# Patient Record
Sex: Female | Born: 1955 | Race: White | Hispanic: No | Smoking: Current every day smoker
Health system: Southern US, Community
[De-identification: ages and names within clinical notes are randomized; demographics above are authoritative.]

## PROBLEM LIST (undated history)

## (undated) DIAGNOSIS — F419 Anxiety disorder, unspecified: Secondary | ICD-10-CM

## (undated) DIAGNOSIS — M76899 Other specified enthesopathies of unspecified lower limb, excluding foot: Secondary | ICD-10-CM

## (undated) DIAGNOSIS — M542 Cervicalgia: Secondary | ICD-10-CM

## (undated) DIAGNOSIS — R42 Dizziness and giddiness: Secondary | ICD-10-CM

## (undated) DIAGNOSIS — F319 Bipolar disorder, unspecified: Secondary | ICD-10-CM

## (undated) DIAGNOSIS — E669 Obesity, unspecified: Secondary | ICD-10-CM

## (undated) DIAGNOSIS — E119 Type 2 diabetes mellitus without complications: Secondary | ICD-10-CM

## (undated) DIAGNOSIS — G894 Chronic pain syndrome: Secondary | ICD-10-CM

## (undated) DIAGNOSIS — M545 Low back pain, unspecified: Secondary | ICD-10-CM

## (undated) DIAGNOSIS — R51 Headache: Secondary | ICD-10-CM

## (undated) DIAGNOSIS — R209 Unspecified disturbances of skin sensation: Secondary | ICD-10-CM

## (undated) DIAGNOSIS — I10 Essential (primary) hypertension: Secondary | ICD-10-CM

## (undated) DIAGNOSIS — M25549 Pain in joints of unspecified hand: Secondary | ICD-10-CM

## (undated) DIAGNOSIS — R519 Headache, unspecified: Secondary | ICD-10-CM

## (undated) DIAGNOSIS — I209 Angina pectoris, unspecified: Secondary | ICD-10-CM

## (undated) HISTORY — DX: Pain in joints of unspecified hand: M25.549

## (undated) HISTORY — DX: Chronic pain syndrome: G89.4

## (undated) HISTORY — DX: Headache, unspecified: R51.9

## (undated) HISTORY — DX: Low back pain, unspecified: M54.50

## (undated) HISTORY — PX: KNEE ARTHROPLASTY: SHX992

## (undated) HISTORY — DX: Type 2 diabetes mellitus without complications: E11.9

## (undated) HISTORY — DX: Headache: R51

## (undated) HISTORY — DX: Dizziness and giddiness: R42

## (undated) HISTORY — DX: Cervicalgia: M54.2

## (undated) HISTORY — DX: Low back pain: M54.5

## (undated) HISTORY — DX: Unspecified disturbances of skin sensation: R20.9

## (undated) HISTORY — PX: TONSILLECTOMY: SUR1361

## (undated) HISTORY — DX: Other specified enthesopathies of unspecified lower limb, excluding foot: M76.899

---

## 2000-12-15 ENCOUNTER — Other Ambulatory Visit: Admission: RE | Admit: 2000-12-15 | Discharge: 2000-12-15 | Payer: Self-pay | Admitting: Family Medicine

## 2001-12-04 ENCOUNTER — Encounter: Payer: Self-pay | Admitting: Emergency Medicine

## 2001-12-04 ENCOUNTER — Emergency Department (HOSPITAL_COMMUNITY): Admission: EM | Admit: 2001-12-04 | Discharge: 2001-12-04 | Payer: Self-pay | Admitting: Emergency Medicine

## 2002-03-27 ENCOUNTER — Other Ambulatory Visit: Admission: RE | Admit: 2002-03-27 | Discharge: 2002-03-27 | Payer: Self-pay | Admitting: Internal Medicine

## 2002-04-24 ENCOUNTER — Other Ambulatory Visit: Admission: RE | Admit: 2002-04-24 | Discharge: 2002-04-24 | Payer: Self-pay | Admitting: Internal Medicine

## 2002-10-13 ENCOUNTER — Encounter: Payer: Self-pay | Admitting: Emergency Medicine

## 2002-10-13 ENCOUNTER — Emergency Department (HOSPITAL_COMMUNITY): Admission: EM | Admit: 2002-10-13 | Discharge: 2002-10-13 | Payer: Self-pay | Admitting: Emergency Medicine

## 2003-04-02 ENCOUNTER — Other Ambulatory Visit: Admission: RE | Admit: 2003-04-02 | Discharge: 2003-04-02 | Payer: Self-pay | Admitting: Internal Medicine

## 2003-05-23 ENCOUNTER — Ambulatory Visit (HOSPITAL_COMMUNITY): Admission: RE | Admit: 2003-05-23 | Discharge: 2003-05-23 | Payer: Self-pay | Admitting: Obstetrics and Gynecology

## 2003-05-23 ENCOUNTER — Ambulatory Visit (HOSPITAL_BASED_OUTPATIENT_CLINIC_OR_DEPARTMENT_OTHER): Admission: RE | Admit: 2003-05-23 | Discharge: 2003-05-23 | Payer: Self-pay | Admitting: Obstetrics and Gynecology

## 2003-05-23 ENCOUNTER — Encounter (INDEPENDENT_AMBULATORY_CARE_PROVIDER_SITE_OTHER): Payer: Self-pay | Admitting: Specialist

## 2004-01-03 ENCOUNTER — Emergency Department (HOSPITAL_COMMUNITY): Admission: EM | Admit: 2004-01-03 | Discharge: 2004-01-03 | Payer: Self-pay | Admitting: Family Medicine

## 2004-07-10 ENCOUNTER — Ambulatory Visit: Payer: Self-pay | Admitting: Family Medicine

## 2004-08-13 ENCOUNTER — Ambulatory Visit: Payer: Self-pay | Admitting: Internal Medicine

## 2004-08-27 ENCOUNTER — Ambulatory Visit: Payer: Self-pay | Admitting: Internal Medicine

## 2004-09-03 ENCOUNTER — Ambulatory Visit: Payer: Self-pay | Admitting: *Deleted

## 2005-02-12 ENCOUNTER — Emergency Department (HOSPITAL_COMMUNITY): Admission: EM | Admit: 2005-02-12 | Discharge: 2005-02-12 | Payer: Self-pay | Admitting: Family Medicine

## 2005-05-22 ENCOUNTER — Emergency Department (HOSPITAL_COMMUNITY): Admission: EM | Admit: 2005-05-22 | Discharge: 2005-05-22 | Payer: Self-pay | Admitting: Family Medicine

## 2005-10-04 ENCOUNTER — Encounter: Admission: RE | Admit: 2005-10-04 | Discharge: 2005-10-04 | Payer: Self-pay | Admitting: Internal Medicine

## 2006-04-21 ENCOUNTER — Emergency Department (HOSPITAL_COMMUNITY): Admission: EM | Admit: 2006-04-21 | Discharge: 2006-04-21 | Payer: Self-pay | Admitting: Family Medicine

## 2007-02-06 ENCOUNTER — Emergency Department (HOSPITAL_COMMUNITY): Admission: EM | Admit: 2007-02-06 | Discharge: 2007-02-06 | Payer: Self-pay | Admitting: Family Medicine

## 2007-07-03 ENCOUNTER — Emergency Department (HOSPITAL_COMMUNITY): Admission: EM | Admit: 2007-07-03 | Discharge: 2007-07-03 | Payer: Self-pay | Admitting: Family Medicine

## 2008-02-23 ENCOUNTER — Encounter: Admission: RE | Admit: 2008-02-23 | Discharge: 2008-02-23 | Payer: Self-pay | Admitting: Internal Medicine

## 2008-03-30 ENCOUNTER — Emergency Department (HOSPITAL_COMMUNITY): Admission: EM | Admit: 2008-03-30 | Discharge: 2008-03-30 | Payer: Self-pay | Admitting: Emergency Medicine

## 2008-04-12 ENCOUNTER — Encounter: Admission: RE | Admit: 2008-04-12 | Discharge: 2008-04-12 | Payer: Self-pay | Admitting: Internal Medicine

## 2009-09-13 ENCOUNTER — Emergency Department (HOSPITAL_COMMUNITY): Admission: EM | Admit: 2009-09-13 | Discharge: 2009-09-13 | Payer: Self-pay | Admitting: Family Medicine

## 2010-06-14 ENCOUNTER — Encounter: Payer: Self-pay | Admitting: Internal Medicine

## 2010-07-22 ENCOUNTER — Inpatient Hospital Stay (INDEPENDENT_AMBULATORY_CARE_PROVIDER_SITE_OTHER)
Admission: RE | Admit: 2010-07-22 | Discharge: 2010-07-22 | Disposition: A | Discharge status: Home / Self Care | Payer: Medicaid Other | Source: Ambulatory Visit | Attending: Emergency Medicine | Admitting: Emergency Medicine

## 2010-07-22 DIAGNOSIS — R05 Cough: Secondary | ICD-10-CM

## 2010-07-22 DIAGNOSIS — R070 Pain in throat: Secondary | ICD-10-CM

## 2010-08-23 ENCOUNTER — Inpatient Hospital Stay (INDEPENDENT_AMBULATORY_CARE_PROVIDER_SITE_OTHER)
Admission: RE | Admit: 2010-08-23 | Discharge: 2010-08-23 | Disposition: A | Discharge status: Home / Self Care | Payer: Self-pay | Source: Ambulatory Visit | Attending: Family Medicine | Admitting: Family Medicine

## 2010-08-23 ENCOUNTER — Ambulatory Visit (INDEPENDENT_AMBULATORY_CARE_PROVIDER_SITE_OTHER): Payer: Self-pay

## 2010-08-23 ENCOUNTER — Ambulatory Visit (HOSPITAL_COMMUNITY): Payer: Self-pay

## 2010-08-23 ENCOUNTER — Ambulatory Visit (HOSPITAL_BASED_OUTPATIENT_CLINIC_OR_DEPARTMENT_OTHER): Admission: RE | Admit: 2010-08-23 | Payer: Self-pay | Source: Ambulatory Visit

## 2010-08-23 DIAGNOSIS — S93609A Unspecified sprain of unspecified foot, initial encounter: Secondary | ICD-10-CM

## 2010-08-23 DIAGNOSIS — S82109A Unspecified fracture of upper end of unspecified tibia, initial encounter for closed fracture: Secondary | ICD-10-CM

## 2010-08-24 ENCOUNTER — Other Ambulatory Visit (HOSPITAL_COMMUNITY): Payer: Self-pay | Admitting: Orthopedic Surgery

## 2010-08-24 DIAGNOSIS — S82109A Unspecified fracture of upper end of unspecified tibia, initial encounter for closed fracture: Secondary | ICD-10-CM

## 2010-08-24 DIAGNOSIS — M25562 Pain in left knee: Secondary | ICD-10-CM

## 2010-08-25 ENCOUNTER — Ambulatory Visit (HOSPITAL_COMMUNITY)
Admission: RE | Admit: 2010-08-25 | Discharge: 2010-08-25 | Disposition: A | Discharge status: Home / Self Care | Payer: Medicaid Other | Source: Ambulatory Visit | Attending: Orthopedic Surgery | Admitting: Orthopedic Surgery

## 2010-08-25 DIAGNOSIS — M25562 Pain in left knee: Secondary | ICD-10-CM

## 2010-08-25 DIAGNOSIS — S82109A Unspecified fracture of upper end of unspecified tibia, initial encounter for closed fracture: Secondary | ICD-10-CM | POA: Insufficient documentation

## 2010-08-25 DIAGNOSIS — W19XXXA Unspecified fall, initial encounter: Secondary | ICD-10-CM | POA: Insufficient documentation

## 2010-08-25 DIAGNOSIS — M1710 Unilateral primary osteoarthritis, unspecified knee: Secondary | ICD-10-CM | POA: Insufficient documentation

## 2010-08-27 ENCOUNTER — Other Ambulatory Visit (HOSPITAL_COMMUNITY): Payer: Self-pay | Admitting: Family Medicine

## 2010-08-27 ENCOUNTER — Ambulatory Visit (INDEPENDENT_AMBULATORY_CARE_PROVIDER_SITE_OTHER)
Admission: RE | Admit: 2010-08-27 | Discharge: 2010-08-27 | Disposition: A | Discharge status: Home / Self Care | Payer: Medicaid Other | Source: Ambulatory Visit | Attending: Family Medicine | Admitting: Family Medicine

## 2010-08-27 DIAGNOSIS — M25469 Effusion, unspecified knee: Secondary | ICD-10-CM

## 2010-08-27 DIAGNOSIS — W19XXXA Unspecified fall, initial encounter: Secondary | ICD-10-CM

## 2010-08-27 DIAGNOSIS — S82109A Unspecified fracture of upper end of unspecified tibia, initial encounter for closed fracture: Secondary | ICD-10-CM

## 2010-09-02 ENCOUNTER — Other Ambulatory Visit (HOSPITAL_COMMUNITY): Payer: Self-pay | Admitting: Orthopedic Surgery

## 2010-09-02 ENCOUNTER — Encounter (HOSPITAL_COMMUNITY)
Admission: RE | Admit: 2010-09-02 | Discharge: 2010-09-02 | Disposition: A | Discharge status: Home / Self Care | Payer: Medicaid Other | Source: Ambulatory Visit | Attending: Orthopedic Surgery | Admitting: Orthopedic Surgery

## 2010-09-02 DIAGNOSIS — S82143A Displaced bicondylar fracture of unspecified tibia, initial encounter for closed fracture: Secondary | ICD-10-CM

## 2010-09-02 LAB — COMPREHENSIVE METABOLIC PANEL
ALT: 17 U/L (ref 0–35)
Albumin: 3.7 g/dL (ref 3.5–5.2)
BUN: 6 mg/dL (ref 6–23)
CO2: 31 mEq/L (ref 19–32)
Chloride: 102 mEq/L (ref 96–112)
Creatinine, Ser: 0.81 mg/dL (ref 0.4–1.2)
GFR calc Af Amer: >60 mL/min (ref >60)
Potassium: 4 mEq/L (ref 3.5–5.1)
Sodium: 138 mEq/L (ref 135–145)
Total Bilirubin: 0.7 mg/dL (ref 0.3–1.2)
Total Protein: 6.1 g/dL (ref 6.0–8.3)

## 2010-09-02 LAB — CBC
HCT: 40.8 % (ref 36.0–46.0)
Hemoglobin: 13.6 g/dL (ref 12.0–15.0)
MCH: 29.6 pg (ref 26.0–34.0)
MCHC: 33.3 g/dL (ref 30.0–36.0)
MCV: 88.7 fL (ref 78.0–100.0)
Platelets: 203 10*3/uL (ref 150–400)
WBC: 7.5 10*3/uL (ref 4.0–10.5)

## 2010-09-02 LAB — SURGICAL PCR SCREEN: Staphylococcus aureus: POSITIVE — AB

## 2010-09-02 LAB — PROTIME-INR: Prothrombin Time: 13 seconds (ref 11.6–15.2)

## 2010-09-04 ENCOUNTER — Observation Stay (HOSPITAL_COMMUNITY)
Admission: RE | Admit: 2010-09-04 | Discharge: 2010-09-05 | Disposition: A | Discharge status: Home / Self Care | Payer: Medicaid Other | Source: Ambulatory Visit | Attending: Orthopedic Surgery | Admitting: Orthopedic Surgery

## 2010-09-04 ENCOUNTER — Observation Stay (HOSPITAL_COMMUNITY): Payer: Medicaid Other

## 2010-09-04 DIAGNOSIS — F329 Major depressive disorder, single episode, unspecified: Secondary | ICD-10-CM | POA: Insufficient documentation

## 2010-09-04 DIAGNOSIS — I10 Essential (primary) hypertension: Secondary | ICD-10-CM | POA: Insufficient documentation

## 2010-09-04 DIAGNOSIS — S82109A Unspecified fracture of upper end of unspecified tibia, initial encounter for closed fracture: Principal | ICD-10-CM | POA: Insufficient documentation

## 2010-09-04 DIAGNOSIS — F17200 Nicotine dependence, unspecified, uncomplicated: Secondary | ICD-10-CM | POA: Insufficient documentation

## 2010-09-04 DIAGNOSIS — W19XXXA Unspecified fall, initial encounter: Secondary | ICD-10-CM | POA: Insufficient documentation

## 2010-09-15 NOTE — Op Note (Signed)
Madison Wells, [place] NO.:  [id number]  MEDICAL RECORD NO.:  [id number]           PATIENT TYPE:  O  LOCATION:  5013                         FACILITY:  [facility]  PHYSICIAN:  Nadara Mustard, MD     DATE OF BIRTH:  Aug 31, 1955  DATE OF PROCEDURE:  09/04/2010 DATE OF DISCHARGE:                              OPERATIVE REPORT   PREOPERATIVE DIAGNOSIS:  Joint depression left lateral tibial plateau fracture.  POSTOPERATIVE DIAGNOSIS:  Joint depression left lateral tibial plateau fracture.  PROCEDURE:  Open reduction and internal fixation left lateral tibial plateau fracture.  SURGEON.:  Nadara Mustard, MD  ANESTHESIA:  General.  ESTIMATED BLOOD LOSS:  Minimal.  ANTIBIOTICS:  Kefzol 2 g.  DRAINS:  None.  COMPLICATIONS:  None.  DISPOSITION:  To PACU in stable condition.  Plan for discharge to home once safe with therapy.  Follow up in the office in 1 week.  INDICATIONS FOR PROCEDURE:  The patient is a 55 year old woman who fell from a ground-level fall sustaining a joint depression left lateral tibial plateau fracture.  A CT scan confirmed the displacement.  The patient presents at this time for open reduction and internal fixation. Risks and benefits were discussed including infection, neurovascular injury, persistent pain, need for additional surgery.  The patient states she understands and wished to proceed at this time.  DESCRIPTION OF PROCEDURE:  The patient was brought to OR room 15 and underwent a general anesthetic.  After adequate level of anesthesia obtained, the patient was placed on the flat [place] table and the left lower extremity was prepped using DuraPrep, draped into sterile field and Ioban was used to cover all exposed skin.  A guidewire was used to identify the starting point for the joint depression tibial plateau fracture.  Once this guide pin was centered at the joint depression fragment, this was then overdrilled with the cannulated  drill and then a bone tamp was used to tamp up the joint depression fragment.  C-arm fluoroscopy verified the reduction.  Two 7.3-cannulated screws were then used to hold the joint depression piece elevated.  C-arm fluoroscopy verified alignment and two 7.3 screws were used to stabilize the tibial plateau fracture.  C-arm fluoroscopy verified reduction.  The subcu was closed using 2-0 Vicryl, the skin was closed using 2-0 nylon with a far- near, near-far suture.  The wounds were irrigated and cleansed.  The wounds were covered with Adaptic orthopedic sponges, Kerlix and the patient underwent a compressive wrap from her toes to the tibial to just above the patella secondary to the significant venous stasis edema in the left lower extremity.  C-arm fluoroscopy was used to further evaluate the left foot showing to be no fractures.  She did have a significant swelling in her foot.  Previous radiographs were also negative for fractures.  The patient was extubated and taken to the PACU in stable condition.  Plan for discharge to home once she is safe with gait training.     Nadara Mustard, MD     MVD/MEDQ  D:  09/04/2010  T:  09/05/2010  Job:  [id number]  Electronically Signed by  Ellanor Feuerstein MD on 09/15/2010 03:01:01 PM

## 2010-10-09 NOTE — Op Note (Signed)
NAME:  Madison Wells, Madison Wells                     ACCOUNT NO.:  [id number]   MEDICAL RECORD NO.:  [id number]                   PATIENT TYPE:  AMB   LOCATION:  [facility]                                 FACILITY:  [facility]   PHYSICIAN:  Katherine Roan, M.D.               DATE OF BIRTH:  06-04-1955   DATE OF PROCEDURE:  05/23/2003  DATE OF DISCHARGE:                                 OPERATIVE REPORT   PREOPERATIVE DIAGNOSIS:  Heavy periods, status post cesarean section.   POSTOPERATIVE DIAGNOSIS:  Heavy periods, status post cesarean section.   OPERATION:  1. Pelvic examination under anesthesia.  2. Hysteroscopy with endometrial resection and fulguration.   The patient was placed in a lithotomy position after anesthesia was  administered and using LMA.  Exam under anesthesia revealed a well-supported  cervix.  The uterus was normal size and shape without masses.  The cervix  was carefully dilated and the endometrial cavity was visualized with the  resectoscope.  There was some granular tissue anteriorly and posteriorly,  and I resected the endometrial cavity very carefully and fulgurated the base  of the resection.  There were fairly vascular changes in the endometrium.   All the resected material was sent to the lab for study.  At the termination  of the procedure a thorough curettage was performed.  Scant blood loss  occurred.  Ms. Schmall tolerated this procedure well.                                               Katherine Roan, M.D.    SDM/MEDQ  D:  05/23/2003  T:  05/23/2003  Job:  [id number]

## 2011-02-12 LAB — POCT URINALYSIS DIP (DEVICE)
Glucose, UA: NEGATIVE
Nitrite: NEGATIVE
Operator id: 116391
Protein, ur: NEGATIVE
Specific Gravity, Urine: 1.01
pH: 5.5

## 2011-02-17 ENCOUNTER — Encounter
Payer: Medicaid Other | Attending: Physical Medicine and Rehabilitation | Admitting: Physical Medicine and Rehabilitation

## 2011-02-17 DIAGNOSIS — M545 Low back pain, unspecified: Secondary | ICD-10-CM | POA: Insufficient documentation

## 2011-02-17 DIAGNOSIS — R209 Unspecified disturbances of skin sensation: Secondary | ICD-10-CM | POA: Insufficient documentation

## 2011-02-17 DIAGNOSIS — M76899 Other specified enthesopathies of unspecified lower limb, excluding foot: Secondary | ICD-10-CM | POA: Insufficient documentation

## 2011-02-17 DIAGNOSIS — M25519 Pain in unspecified shoulder: Secondary | ICD-10-CM | POA: Insufficient documentation

## 2011-02-17 DIAGNOSIS — M542 Cervicalgia: Secondary | ICD-10-CM

## 2011-02-17 DIAGNOSIS — M79609 Pain in unspecified limb: Secondary | ICD-10-CM

## 2011-02-17 DIAGNOSIS — M25549 Pain in joints of unspecified hand: Secondary | ICD-10-CM
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
## 2011-02-18 ENCOUNTER — Other Ambulatory Visit: Payer: Self-pay | Admitting: Physical Medicine and Rehabilitation

## 2011-02-18 ENCOUNTER — Ambulatory Visit (HOSPITAL_COMMUNITY)
Admission: RE | Admit: 2011-02-18 | Discharge: 2011-02-18 | Disposition: A | Discharge status: Home / Self Care | Payer: Medicaid Other | Source: Ambulatory Visit | Attending: Physical Medicine and Rehabilitation | Admitting: Physical Medicine and Rehabilitation

## 2011-02-18 DIAGNOSIS — R52 Pain, unspecified: Secondary | ICD-10-CM

## 2011-02-18 DIAGNOSIS — M545 Low back pain, unspecified: Secondary | ICD-10-CM | POA: Insufficient documentation

## 2011-02-18 DIAGNOSIS — G8929 Other chronic pain: Secondary | ICD-10-CM | POA: Insufficient documentation

## 2011-02-18 DIAGNOSIS — M25519 Pain in unspecified shoulder: Secondary | ICD-10-CM | POA: Insufficient documentation

## 2011-02-23 ENCOUNTER — Ambulatory Visit (INDEPENDENT_AMBULATORY_CARE_PROVIDER_SITE_OTHER): Payer: Medicaid Other | Admitting: Physician Assistant

## 2011-02-23 DIAGNOSIS — F315 Bipolar disorder, current episode depressed, severe, with psychotic features: Secondary | ICD-10-CM

## 2011-03-09 ENCOUNTER — Ambulatory Visit: Payer: Medicaid Other

## 2011-03-10 ENCOUNTER — Encounter
Payer: Medicaid Other | Attending: Physical Medicine and Rehabilitation | Admitting: Physical Medicine and Rehabilitation

## 2011-03-10 DIAGNOSIS — M79609 Pain in unspecified limb: Secondary | ICD-10-CM

## 2011-03-10 DIAGNOSIS — R209 Unspecified disturbances of skin sensation: Secondary | ICD-10-CM

## 2011-03-10 DIAGNOSIS — G8929 Other chronic pain: Secondary | ICD-10-CM | POA: Insufficient documentation

## 2011-03-10 DIAGNOSIS — Z79899 Other long term (current) drug therapy: Secondary | ICD-10-CM | POA: Insufficient documentation

## 2011-03-10 DIAGNOSIS — M76899 Other specified enthesopathies of unspecified lower limb, excluding foot: Secondary | ICD-10-CM | POA: Insufficient documentation

## 2011-03-10 DIAGNOSIS — G5600 Carpal tunnel syndrome, unspecified upper limb: Secondary | ICD-10-CM | POA: Insufficient documentation

## 2011-03-11 ENCOUNTER — Ambulatory Visit: Payer: Medicaid Other | Attending: Physical Medicine and Rehabilitation

## 2011-03-11 DIAGNOSIS — M2560 Stiffness of unspecified joint, not elsewhere classified: Secondary | ICD-10-CM | POA: Insufficient documentation

## 2011-03-11 DIAGNOSIS — R5381 Other malaise: Secondary | ICD-10-CM | POA: Insufficient documentation

## 2011-03-11 DIAGNOSIS — M2550 Pain in unspecified joint: Secondary | ICD-10-CM | POA: Insufficient documentation

## 2011-03-11 DIAGNOSIS — IMO0001 Reserved for inherently not codable concepts without codable children: Secondary | ICD-10-CM | POA: Insufficient documentation

## 2011-03-12 ENCOUNTER — Ambulatory Visit (HOSPITAL_COMMUNITY): Payer: Medicaid Other | Admitting: Psychology

## 2011-03-17 ENCOUNTER — Encounter
Payer: Medicaid Other | Attending: Physical Medicine and Rehabilitation | Admitting: Physical Medicine and Rehabilitation

## 2011-03-17 DIAGNOSIS — M542 Cervicalgia: Secondary | ICD-10-CM

## 2011-03-17 DIAGNOSIS — M545 Low back pain, unspecified: Secondary | ICD-10-CM | POA: Insufficient documentation

## 2011-03-17 DIAGNOSIS — M79609 Pain in unspecified limb: Secondary | ICD-10-CM | POA: Insufficient documentation

## 2011-03-17 DIAGNOSIS — M25549 Pain in joints of unspecified hand: Secondary | ICD-10-CM

## 2011-03-17 DIAGNOSIS — M1710 Unilateral primary osteoarthritis, unspecified knee: Secondary | ICD-10-CM

## 2011-03-17 DIAGNOSIS — G894 Chronic pain syndrome: Secondary | ICD-10-CM

## 2011-03-17 DIAGNOSIS — M76899 Other specified enthesopathies of unspecified lower limb, excluding foot: Secondary | ICD-10-CM | POA: Insufficient documentation

## 2011-03-17 DIAGNOSIS — G5600 Carpal tunnel syndrome, unspecified upper limb: Secondary | ICD-10-CM | POA: Insufficient documentation

## 2011-03-17 DIAGNOSIS — G8929 Other chronic pain: Secondary | ICD-10-CM | POA: Insufficient documentation

## 2011-03-17 DIAGNOSIS — IMO0001 Reserved for inherently not codable concepts without codable children: Secondary | ICD-10-CM | POA: Insufficient documentation

## 2011-03-17 NOTE — Assessment & Plan Note (Signed)
Ms. Sinkler is a pleasant 55 year old woman who is followed at [facility] for multiple chronic pain complaints.  She is here for the results of her electrodiagnostic study which was done on July 10, 2010.  She was noted to have bilateral mild-to-moderate median neuropathy at the wrist consistent with carpal tunnel.  I have discussed this with there.  She also has complaints of low back pain which is her #1 problem and occasional left leg pain and bilateral leg swelling.  Pain is typically averaging between 6 and 8 on a scale of 10, described as sharp, burning, dull, stabbing, tingling, aching.  Pain is worse with walking, bending, sitting, standing; improves with rest, heat, medication.  Reports fair relief with current meds.  MEDICATIONS:  She is currently taking per primary care and Orthopedics include: 1. Nabumetone 750 one p.o. b.i.d. 2. Hydrocodone 5/500 q.6 hours. 3. Trazodone 100 mg b.i.d.  FUNCTIONAL STATUS:  She can walk 15 minutes last.  She is able to climb stairs and drive.  She is independent with self-care.  She also does some cooking and cleaning.  Denies problems controlling bowel or bladder.  Admits to depression, anxiety. Denies suicidal ideation.  No other changes in past medical, social, or family history.  PHYSICAL EXAMINATION:  VITAL SIGNS:  On exam today, her blood pressure is 118/37, pulse 78, respirations 18, 92% saturated on room air. GENERAL:  She is an obese woman who does not appear in any distress. She is oriented x3. NEUROLOGIC:  Speech is clear.  Affect is bright.  She is alert, cooperative, and pleasant.  Follows commands without difficulty. Answers my questions appropriately.  Cranial nerves, coordination are grossly intact.  Reflexes are 1+ in the upper and lower extremities.  No abnormal tone, clonus, or tremors are noted. MUSCULOSKELETAL:  Motor strength is good without obvious focal deficit in upper  and lower extremities. Transitioning from sitting to standing done without difficulty.  Gait is nonantalgic.  She has limitations in lumbar motion in all planes.  IMPRESSION: 1. Mild-to-moderate bilateral carpal tunnel syndrome. 2. Left lower extremity pain consistent with mild lumbar nerve root     irritation.  Pain radiates from the low back to the buttock and to     the left lower extremity especially with prolonged standing. 3. She has mild trochanteric bursitis.  PLAN:  We will give her prescription for some wrist splints at night.  I have discussed the use of these.  We will also trial her on gabapentin to see if we can improve her standing and walking for longer periods of time.  May also consider trial of tramadol as well.  I have answered all her questions.  We will see her back in a month.  I have reviewed the risks and benefits as well as side effects of the gabapentin with her, she would like to trial it.     Brantley Stage, M.D. Electronically Signed   DMK/MedQ D:  03/17/2011 13:39:22  T:  03/17/2011 21:47:29  Job #:  [id number]

## 2011-03-18 ENCOUNTER — Ambulatory Visit (HOSPITAL_COMMUNITY): Payer: Medicaid Other | Admitting: Psychology

## 2011-03-18 DIAGNOSIS — F39 Unspecified mood [affective] disorder: Secondary | ICD-10-CM

## 2011-03-23 ENCOUNTER — Ambulatory Visit: Payer: Medicaid Other | Admitting: Rehabilitation

## 2011-04-12 ENCOUNTER — Other Ambulatory Visit (HOSPITAL_COMMUNITY): Payer: Self-pay

## 2011-04-13 ENCOUNTER — Ambulatory Visit (HOSPITAL_COMMUNITY): Payer: Medicaid Other | Admitting: Physician Assistant

## 2011-04-13 ENCOUNTER — Encounter (HOSPITAL_COMMUNITY): Payer: Self-pay | Admitting: Physician Assistant

## 2011-04-13 VITALS — BP 141/73 | HR 65 | Temp 97.0°F | Ht 65.0 in | Wt 315.0 lb

## 2011-04-13 DIAGNOSIS — F323 Major depressive disorder, single episode, severe with psychotic features: Secondary | ICD-10-CM

## 2011-04-13 DIAGNOSIS — R52 Pain, unspecified: Secondary | ICD-10-CM

## 2011-04-13 DIAGNOSIS — F333 Major depressive disorder, recurrent, severe with psychotic symptoms: Secondary | ICD-10-CM

## 2011-04-13 DIAGNOSIS — E669 Obesity, unspecified: Secondary | ICD-10-CM

## 2011-04-13 DIAGNOSIS — I10 Essential (primary) hypertension: Secondary | ICD-10-CM

## 2011-04-13 MED ORDER — LAMOTRIGINE 100 MG PO TABS: 100.0000 mg | ORAL_TABLET | Freq: Every day | ORAL | Status: DC

## 2011-04-13 MED ORDER — CITALOPRAM HYDROBROMIDE 40 MG PO TABS: 40.0000 mg | ORAL_TABLET | Freq: Every day | ORAL | Status: DC

## 2011-04-13 MED ORDER — BUSPIRONE HCL 10 MG PO TABS: 10.0000 mg | ORAL_TABLET | Freq: Two times a day (BID) | ORAL | Status: DC

## 2011-04-13 MED ORDER — BUPROPION HCL ER (SR) 150 MG PO TB12: 150.0000 mg | ORAL_TABLET | Freq: Two times a day (BID) | ORAL | Status: DC

## 2011-04-13 MED ORDER — ARIPIPRAZOLE 5 MG PO TABS: 5.0000 mg | ORAL_TABLET | Freq: Every day | ORAL | Status: DC

## 2011-04-13 NOTE — Progress Notes (Signed)
[facility] [id number] Progress Note  Madison Wells [id number] 55 y.o.  04/13/2011 9:29 AM  Chief Complaint: Med management  History of Present Illness: Pt. 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 Suicidal Ideation: No Plan Formed: No Patient has means to carry out plan: No  Homicidal Ideation: No Plan Formed: No Patient has means to carry out plan: No  Review of Systems:  [facility]. Psychiatric: Agitation: Yes moderate and mostly in the afternoon Hallucination: No Depressed Mood: Yes mild Insomnia: No Hypersomnia: No Altered Concentration: No Feels Worthless: Yes sometimes due to her current situation Grandiose Ideas: No Belief In Special Powers: No New/Increased Substance Abuse: No Compulsions: No  Neurologic: Headache: No Seizure: No Paresthesias: Yes pt states numbness in her left arm, left leg, and can't feel her finger tips.  Past Medical Family, Social History:   Outpatient Encounter Prescriptions as of 04/13/2011  Medication Sig Dispense Refill  . ARIPiprazole (ABILIFY) 5 MG tablet Take 5 mg by mouth daily. Take one in the am and 2 at bedtime      . buPROPion (WELLBUTRIN SR) 150 MG 12 hr tablet Take 150 mg by mouth 2 (two) times daily.        . busPIRone (BUSPAR) 10 MG tablet Take 10 mg by mouth 2 (two) times daily. Take one by po bid       . citalopram (CELEXA) 40 MG tablet Take 40 mg by mouth daily. Take one po qd      . lamoTRIgine (LAMICTAL) 100 MG tablet Take 100 mg by mouth daily.        . traZODone (DESYREL) 100 MG tablet Take 100 mg by mouth at bedtime.          Past Psychiatric History/Hospitalization(s): Anxiety: Yes Bipolar Disorder: Yes Depression: Yes Mania: No Psychosis: No Schizophrenia: No Personality Disorder: No Hospitalization for psychiatric illness: No History of Electroconvulsive Shock Therapy: No Prior  Suicide Attempts: Yes  Physical Exam: Constitutional:  BP 141/73  Pulse 65  Temp(Src) 97 F (36.1 C) (Temporal)  Ht 5\' 5"  (1.651 m)  Wt 315 lb (142.883 kg)  BMI 52.42 kg/m2  General Appearance: alert, oriented, no acute distress and obese  Musculoskeletal: Strength & Muscle Tone: Pt. is morbidly obese and does not move well due to knee pain. Gait & Station: unsteady Patient leans: N/A  Psychiatric: Speech (describe rate, volume, coherence, spontaneity, and abnormalities if any): Normal  Thought Process (describe rate, content, abstract reasoning, and computation): normal  Associations: Coherent  Thoughts: normal  Mental Status: Orientation: oriented to person, place, time/date, situation and day of week Mood & Affect: normal affect Attention Span & Concentration: fair  Medical Decision Making (Choose Three): Established Problem, Stable/Improving (1), Order AIMS Test (2) and Review of Last Therapy Session (1)  Assessment: Axis I: 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  Axis V: GAF: 60   Plan: Continue current plan of care, medications as ordered, follow up in 8 weeks.           Labs as ordered, and continue outpatient therapy.  Kelani Robart, PA 04/13/2011
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
## 2011-04-14 ENCOUNTER — Encounter: Payer: Medicaid Other | Attending: Neurosurgery | Admitting: Neurosurgery

## 2011-04-14 ENCOUNTER — Ambulatory Visit: Payer: Medicaid Other | Admitting: Physical Medicine and Rehabilitation

## 2011-04-14 DIAGNOSIS — G5600 Carpal tunnel syndrome, unspecified upper limb: Secondary | ICD-10-CM | POA: Insufficient documentation

## 2011-04-14 DIAGNOSIS — M65849 Other synovitis and tenosynovitis, unspecified hand: Secondary | ICD-10-CM

## 2011-04-14 DIAGNOSIS — M545 Low back pain, unspecified: Secondary | ICD-10-CM | POA: Insufficient documentation

## 2011-04-14 DIAGNOSIS — M5430 Sciatica, unspecified side: Secondary | ICD-10-CM

## 2011-04-14 DIAGNOSIS — M65839 Other synovitis and tenosynovitis, unspecified forearm: Secondary | ICD-10-CM

## 2011-04-14 DIAGNOSIS — M76899 Other specified enthesopathies of unspecified lower limb, excluding foot: Secondary | ICD-10-CM

## 2011-04-14 DIAGNOSIS — M25549 Pain in joints of unspecified hand: Secondary | ICD-10-CM

## 2011-04-14 DIAGNOSIS — M79609 Pain in unspecified limb: Secondary | ICD-10-CM | POA: Insufficient documentation

## 2011-04-14 LAB — CBC WITH DIFFERENTIAL/PLATELET
Basophils Absolute: 0 10*3/uL (ref 0.0–0.1)
Basophils Relative: 0 % (ref 0–1)
Eosinophils Absolute: 0.1 10*3/uL (ref 0.0–0.7)
Hemoglobin: 13.2 g/dL (ref 12.0–15.0)
MCH: 28.8 pg (ref 26.0–34.0)
MCHC: 31.6 g/dL (ref 30.0–36.0)
Neutro Abs: 4.2 10*3/uL (ref 1.7–7.7)
Neutrophils Relative %: 59 % (ref 43–77)
Platelets: 196 10*3/uL (ref 150–400)

## 2011-04-14 LAB — HEMOGLOBIN A1C
Hgb A1c MFr Bld: 5.8 % — ABNORMAL HIGH (ref <5.7)
Mean Plasma Glucose: 120 mg/dL — ABNORMAL HIGH (ref <117)

## 2011-04-14 LAB — VITAMIN D 25 HYDROXY (VIT D DEFICIENCY, FRACTURES): Vit D, 25-Hydroxy: 52 ng/mL (ref 30–89)

## 2011-04-14 LAB — TSH: TSH: 1.931 u[IU]/mL (ref 0.350–4.500)

## 2011-04-14 NOTE — Assessment & Plan Note (Signed)
This is a patient of Dr. Leretha Dykes who is seen as a new patient in October.  At that time, she was started on a trial of gabapentin for her chronic multiple pain complaints.  She has got carpal tunnel syndrome as evidenced by EMG as well as back pain and lower extremity pain.  We did discuss both her cervical and lumbar x-rays which were basically within normal limits.  She states she is still having the problem in her back and she feels like she knows something is wrong regardless of what the x- rays says.  We are going to order an MRI of her lumbar spine.  She rates her average pain as 7.  It is a sharp, burning, dull stabbing, aching pain.  General activity level is 6.  Pain is worse in the evening and night.  Sleep patterns are poor.  Walking, bending, sitting, standing, and activity aggravate.  Rest, heat, ice, and medication tend to help. She uses a cane for ambulation.  She climb steps and drive.  She can walk about 15 minutes long.  She is on disability.  REVIEW OF SYSTEMS:  Notable for difficulties described above as well as some paresthesias, depression, and anxiety.  No suicidal thoughts or aberrant behaviors.  PAST MEDICAL HISTORY/SOCIAL HISTORY/FAMILY HISTORY:  Unchanged.  PHYSICAL EXAM:  Blood pressure is 137/74, pulse 70, respirations 18, and O2 sats 97 on room air.  Her motor strength and sensation are intact. Constitutionally, she is obese.  She is alert and oriented x3.  She has a slight limp to her gait.  IMPRESSION: 1. Mild to moderate bilateral carpal tunnel syndrome. 2. Left lower extremity pain consistent with nerve root irritation     with low back pain.  PLAN:  Refill gabapentin 300 mg 1 p.o. t.i.d., #90 with 2 refills.  We are going to add tramadol 50 mg 1 p.o. b.i.d., #60 with 1 refill.  Her questions were encouraged and answered.  Dr. Pamelia Hoit will see her back in a month after her MRI.     Hyden Soley L. Blima Dessert Electronically  Signed    RLW/MedQ D:  04/14/2011 09:10:54  T:  04/14/2011 09:45:07  Job #:  [id number]

## 2011-04-15 LAB — COMPLETE METABOLIC PANEL WITH GFR
AST: 22 U/L (ref 0–37)
Albumin: 4 g/dL (ref 3.5–5.2)
Alkaline Phosphatase: 89 U/L (ref 39–117)
BUN: 11 mg/dL (ref 6–23)
Calcium: 9.3 mg/dL (ref 8.4–10.5)
Chloride: 105 mEq/L (ref 96–112)
Creat: 0.67 mg/dL (ref 0.50–1.10)
GFR, Est Non African American: >89 mL/min
Glucose, Bld: 82 mg/dL (ref 70–99)
Potassium: 4.1 mEq/L (ref 3.5–5.3)

## 2011-04-16 ENCOUNTER — Emergency Department (HOSPITAL_COMMUNITY): Payer: Medicaid Other

## 2011-04-16 ENCOUNTER — Other Ambulatory Visit: Payer: Self-pay

## 2011-04-16 ENCOUNTER — Emergency Department (HOSPITAL_COMMUNITY)
Admission: EM | Admit: 2011-04-16 | Discharge: 2011-04-17 | Disposition: A | Discharge status: Home / Self Care | Payer: Medicaid Other | Attending: Emergency Medicine | Admitting: Emergency Medicine

## 2011-04-16 ENCOUNTER — Encounter (HOSPITAL_COMMUNITY): Payer: Self-pay | Admitting: Emergency Medicine

## 2011-04-16 DIAGNOSIS — E669 Obesity, unspecified: Secondary | ICD-10-CM | POA: Insufficient documentation

## 2011-04-16 DIAGNOSIS — L02419 Cutaneous abscess of limb, unspecified: Secondary | ICD-10-CM | POA: Insufficient documentation

## 2011-04-16 DIAGNOSIS — F319 Bipolar disorder, unspecified: Secondary | ICD-10-CM | POA: Insufficient documentation

## 2011-04-16 DIAGNOSIS — M7989 Other specified soft tissue disorders: Secondary | ICD-10-CM | POA: Insufficient documentation

## 2011-04-16 DIAGNOSIS — L03116 Cellulitis of left lower limb: Secondary | ICD-10-CM

## 2011-04-16 DIAGNOSIS — R609 Edema, unspecified: Secondary | ICD-10-CM | POA: Insufficient documentation

## 2011-04-16 DIAGNOSIS — M79609 Pain in unspecified limb: Secondary | ICD-10-CM | POA: Insufficient documentation

## 2011-04-16 DIAGNOSIS — Z79899 Other long term (current) drug therapy: Secondary | ICD-10-CM | POA: Insufficient documentation

## 2011-04-16 DIAGNOSIS — I10 Essential (primary) hypertension: Secondary | ICD-10-CM | POA: Insufficient documentation

## 2011-04-16 DIAGNOSIS — L03119 Cellulitis of unspecified part of limb: Secondary | ICD-10-CM | POA: Insufficient documentation

## 2011-04-16 HISTORY — DX: Essential (primary) hypertension: I10

## 2011-04-16 HISTORY — DX: Bipolar disorder, unspecified: F31.9

## 2011-04-16 HISTORY — DX: Anxiety disorder, unspecified: F41.9

## 2011-04-16 HISTORY — DX: Obesity, unspecified: E66.9

## 2011-04-16 LAB — CBC
Hemoglobin: 12.2 g/dL (ref 12.0–15.0)
MCH: 29.6 pg (ref 26.0–34.0)
MCHC: 33.4 g/dL (ref 30.0–36.0)

## 2011-04-16 LAB — BASIC METABOLIC PANEL
BUN: 10 mg/dL (ref 6–23)
Creatinine, Ser: 0.65 mg/dL (ref 0.50–1.10)
GFR calc Af Amer: >90 mL/min (ref >90)
GFR calc non Af Amer: >90 mL/min (ref >90)

## 2011-04-16 LAB — DIFFERENTIAL
Basophils Relative: 0 % (ref 0–1)
Eosinophils Absolute: 0.1 10*3/uL (ref 0.0–0.7)
Monocytes Absolute: 0.5 10*3/uL (ref 0.1–1.0)
Monocytes Relative: 7 % (ref 3–12)
Neutrophils Relative %: 66 % (ref 43–77)

## 2011-04-16 LAB — D-DIMER, QUANTITATIVE: D-Dimer, Quant: 0.37 ug/mL-FEU (ref 0.00–0.48)

## 2011-04-16 MED ORDER — CLINDAMYCIN PHOSPHATE 600 MG/4ML IJ SOLN
INTRAMUSCULAR | Status: AC
  Filled 2011-04-16: qty 4

## 2011-04-16 MED ORDER — MORPHINE SULFATE 4 MG/ML IJ SOLN
4.0000 mg | Freq: Once | INTRAMUSCULAR | Status: AC
  Administered 2011-04-16: 4 mg via INTRAVENOUS
  Filled 2011-04-16: qty 1

## 2011-04-16 MED ORDER — HYDROCODONE-ACETAMINOPHEN 5-325 MG PO TABS: 1.0000 | ORAL_TABLET | ORAL | Status: DC | PRN

## 2011-04-16 MED ORDER — CLINDAMYCIN HCL 150 MG PO CAPS: 300.0000 mg | ORAL_CAPSULE | Freq: Three times a day (TID) | ORAL | Status: DC

## 2011-04-16 MED ORDER — CLINDAMYCIN PHOSPHATE 600 MG/50ML IV SOLN
600.0000 mg | Freq: Once | INTRAVENOUS | Status: AC
  Administered 2011-04-16: 600 mg via INTRAVENOUS

## 2011-04-16 MED ORDER — CLINDAMYCIN HCL 150 MG PO CAPS
600.0000 mg | ORAL_CAPSULE | Freq: Once | ORAL | Status: DC
  Filled 2011-04-16: qty 4

## 2011-04-16 MED ORDER — DEXTROSE 5 % IV SOLN
INTRAVENOUS | Status: AC
  Filled 2011-04-16: qty 50

## 2011-04-16 NOTE — ED Notes (Signed)
PT. REPORTS PROGRESSING LEFT LOWER LEG SWELLING WITH PAIN AND REDDNESS , DENIES INJURY OR FALL.

## 2011-04-16 NOTE — ED Provider Notes (Signed)
History     CSN: [id number] Arrival date & time: 04/16/2011  7:41 PM   First MD Initiated Contact with Patient 04/16/11 2122      Chief Complaint  Patient presents with  . Leg Swelling    (Consider location/radiation/quality/duration/timing/severity/associated sxs/prior treatment) HPI History provided by pt.   Pt developed edema, erythema and pain of L lower leg yesterday.  Has chronic, intermittent, peripheral edema, but usually improves w/ elevation.  RLE edema improves w/ elevation while LLE edema does not since yesterday.  Pain is not aggravated by bearing weight/walking. Denies trauma.  Denies fever.  No risk factors for DVT.  Pt also reports that she has had exertional SOB since yesterday.  No prior history.  Denies CP and cough.    Past Medical History  Diagnosis Date  . Obesity   . Hypertension   . Anxiety   . Bipolar 1 disorder     Past Surgical History  Procedure Date  . Cesarean section   . Knee arthroplasty   . Tonsillectomy     No family history on file.  History  Substance Use Topics  . Smoking status: Current Everyday Smoker -- 10.0 packs/day for 40 years    Types: Cigarettes  . Smokeless tobacco: Not on file  . Alcohol Use: No    OB History    Grav Para Term Preterm Abortions TAB SAB Ect Mult Living                  Review of Systems  All other systems reviewed and are negative.    Allergies  Review of patient's allergies indicates no known allergies.  Home Medications   Current Outpatient Rx  Name Route Sig Dispense Refill  . ARIPIPRAZOLE 5 MG PO TABS Oral Take 1 tablet (5 mg total) by mouth daily. 90 tablet 2  . BUPROPION HCL ER (SR) 150 MG PO TB12 Oral Take 1 tablet (150 mg total) by mouth 2 (two) times daily. 60 tablet 2  . BUSPIRONE HCL 10 MG PO TABS Oral Take 1 tablet (10 mg total) by mouth 2 (two) times daily. Take one by po bid 60 tablet 2  . CITALOPRAM HYDROBROMIDE 40 MG PO TABS Oral Take 40 mg by mouth daily.      Marland Kitchen GABAPENTIN  300 MG PO CAPS Oral Take 300 mg by mouth 3 (three) times daily.      Marland Kitchen LAMOTRIGINE 100 MG PO TABS Oral Take 1 tablet (100 mg total) by mouth daily. 30 tablet 2  . TRAMADOL HCL 50 MG PO TABS Oral Take 50 mg by mouth 2 (two) times daily. Maximum dose= 8 tablets per day     . TRAZODONE HCL 100 MG PO TABS Oral Take 100-200 mg by mouth at bedtime.       BP 112/65  Pulse 85  Temp(Src) 97.9 F (36.6 C) (Oral)  Resp 14  SpO2 95%  Physical Exam  Nursing note and vitals reviewed. Constitutional: She is oriented to person, place, and time. She appears well-developed and well-nourished. No distress.       obese  HENT:  Head: Normocephalic and atraumatic.  Eyes:       Normal appearance  Neck: Normal range of motion.  Cardiovascular: Normal rate and regular rhythm.   Pulmonary/Chest: Effort normal and breath sounds normal.  Musculoskeletal:       Distal half of left anterior lower leg erythematous and ttp.  No calf tenderness.  Bilateral, equal, 2+ pitting edema.  2+  DP pulses and sensation intact bilaterally.    Neurological: She is alert and oriented to person, place, and time.  Skin: Skin is warm and dry. No rash noted.  Psychiatric: She has a normal mood and affect. Her behavior is normal.    ED Course  Procedures (including critical care time)   Date: 04/17/2011  Rate: 67  Rhythm: normal sinus rhythm  QRS Axis: normal  Intervals: normal  ST/T Wave abnormalities: normal  Conduction Disutrbances:none  Narrative Interpretation: low voltage  Old EKG Reviewed: unchanged   Labs Reviewed  BASIC METABOLIC PANEL - Abnormal; Notable for the following:    Potassium 3.3 (*)    Glucose, Bld 124 (*)    All other components within normal limits  D-DIMER, QUANTITATIVE  TROPONIN I  CBC  DIFFERENTIAL  LAB REPORT - SCANNED   Dg Chest 2 View  04/16/2011  *RADIOLOGY REPORT*  Clinical Data: Shortness of breath; left lower leg swelling and rash.  CHEST - 2 VIEW  Comparison: Chest radiograph  performed 09/02/2010  Findings: The lungs are well-aerated.  Mild vascular congestion is noted, without significant pulmonary edema.  There is no evidence of focal opacification, pleural effusion or pneumothorax.  The heart is borderline normal in size; the mediastinal contour is within normal limits.  No acute osseous abnormalities are seen.  IMPRESSION: Mild vascular congestion noted; lungs remain grossly clear.  Original Report Authenticated By: Tonia Ghent, M.D.     1. Cellulitis of left lower extremity       MDM  Pt presents w/ pain, erythema and edema of L lower leg.  She is concerned she has a DVT because though she has chronic, intermittent peripheral edema, sx usually improve w/ elevation.  Exam most consistent w/ cellulitis.  No risk factors for DVT and pain is not aggravated by bearing weight/walking.  ROS reveals that she has had new exertional SOB since yesterday as well.  Discussed w/ Dr. Rubin Payor.  He recommends ordering D-dimer and SOB work-up.  IV clinda and morphine ordered.  EKG, CXR and labs pending.  10:07 PM   EKG non-ischemic and troponin neg.  D-dimer w/in nml range.  Results discussed w/ pt.  She understands that DVT very unlikely and we will continue to treat for infection.  D/c'd home w/ clinda and vicodin for pain.  Recommended f/u with PCP on Monday.  Return precautions discussed.         Otilio Miu, [place] 04/17/11 1146

## 2011-04-16 NOTE — ED Notes (Signed)
Pt returned from xray via stretcher.  She reports that the left lower leg began to turn red and swell yesterday.  She denies injury.  She currently rates pain at 8/10.

## 2011-04-17 NOTE — ED Provider Notes (Signed)
Medical screening examination/treatment/procedure(s) were performed by non-physician practitioner and as supervising physician I was immediately available for consultation/collaboration.   Trey Bebee R. Ryosuke Ericksen, MD 04/17/11 2211 

## 2011-04-20 ENCOUNTER — Encounter (HOSPITAL_COMMUNITY): Payer: Self-pay | Admitting: *Deleted

## 2011-04-20 ENCOUNTER — Encounter (HOSPITAL_COMMUNITY): Payer: Self-pay | Admitting: Physician Assistant

## 2011-04-20 ENCOUNTER — Inpatient Hospital Stay (HOSPITAL_COMMUNITY)
Admission: EM | Admit: 2011-04-20 | Discharge: 2011-04-24 | DRG: 603 | Disposition: A | Discharge status: Home / Self Care | Payer: Medicaid Other | Attending: Internal Medicine | Admitting: Internal Medicine

## 2011-04-20 DIAGNOSIS — L03116 Cellulitis of left lower limb: Secondary | ICD-10-CM

## 2011-04-20 DIAGNOSIS — I872 Venous insufficiency (chronic) (peripheral): Secondary | ICD-10-CM | POA: Diagnosis present

## 2011-04-20 DIAGNOSIS — I10 Essential (primary) hypertension: Secondary | ICD-10-CM | POA: Diagnosis present

## 2011-04-20 DIAGNOSIS — F319 Bipolar disorder, unspecified: Secondary | ICD-10-CM | POA: Diagnosis present

## 2011-04-20 DIAGNOSIS — L02419 Cutaneous abscess of limb, unspecified: Principal | ICD-10-CM | POA: Diagnosis present

## 2011-04-20 DIAGNOSIS — L97809 Non-pressure chronic ulcer of other part of unspecified lower leg with unspecified severity: Secondary | ICD-10-CM | POA: Diagnosis present

## 2011-04-20 DIAGNOSIS — E669 Obesity, unspecified: Secondary | ICD-10-CM | POA: Diagnosis present

## 2011-04-20 DIAGNOSIS — F17200 Nicotine dependence, unspecified, uncomplicated: Secondary | ICD-10-CM | POA: Diagnosis present

## 2011-04-20 DIAGNOSIS — F419 Anxiety disorder, unspecified: Secondary | ICD-10-CM

## 2011-04-20 DIAGNOSIS — Z23 Encounter for immunization: Secondary | ICD-10-CM

## 2011-04-20 DIAGNOSIS — F064 Anxiety disorder due to known physiological condition: Secondary | ICD-10-CM | POA: Diagnosis present

## 2011-04-20 DIAGNOSIS — Z96659 Presence of unspecified artificial knee joint: Secondary | ICD-10-CM

## 2011-04-20 DIAGNOSIS — Z79899 Other long term (current) drug therapy: Secondary | ICD-10-CM

## 2011-04-20 DIAGNOSIS — M7989 Other specified soft tissue disorders: Secondary | ICD-10-CM | POA: Diagnosis present

## 2011-04-20 HISTORY — DX: Angina pectoris, unspecified: I20.9

## 2011-04-20 NOTE — ED Notes (Signed)
The pt has redness and swelling in her lt lower leg and she says she  Is starting to get the same in her rt lower leg,.  She was seen here Friday for the same.  The pt says the redness is increasing and the swelling in the  Leg has increased

## 2011-04-21 ENCOUNTER — Encounter (HOSPITAL_COMMUNITY): Payer: Self-pay | Admitting: Emergency Medicine

## 2011-04-21 DIAGNOSIS — I10 Essential (primary) hypertension: Secondary | ICD-10-CM | POA: Diagnosis present

## 2011-04-21 DIAGNOSIS — E669 Obesity, unspecified: Secondary | ICD-10-CM | POA: Diagnosis present

## 2011-04-21 DIAGNOSIS — M7989 Other specified soft tissue disorders: Secondary | ICD-10-CM | POA: Diagnosis present

## 2011-04-21 DIAGNOSIS — F419 Anxiety disorder, unspecified: Secondary | ICD-10-CM | POA: Diagnosis present

## 2011-04-21 DIAGNOSIS — F319 Bipolar disorder, unspecified: Secondary | ICD-10-CM | POA: Diagnosis present

## 2011-04-21 LAB — DIFFERENTIAL
Basophils Absolute: 0 10*3/uL (ref 0.0–0.1)
Basophils Relative: 0 % (ref 0–1)
Neutro Abs: 4.2 10*3/uL (ref 1.7–7.7)
Neutrophils Relative %: 62 % (ref 43–77)

## 2011-04-21 LAB — CBC
HCT: 37.5 % (ref 36.0–46.0)
MCH: 29.5 pg (ref 26.0–34.0)
MCHC: 34.3 g/dL (ref 30.0–36.0)
MCV: 90.8 fL (ref 78.0–100.0)
Platelets: 180 10*3/uL (ref 150–400)
RBC: 4.13 MIL/uL (ref 3.87–5.11)
RDW: 14.7 % (ref 11.5–15.5)
WBC: 6.4 10*3/uL (ref 4.0–10.5)

## 2011-04-21 LAB — BASIC METABOLIC PANEL
CO2: 33 mEq/L — ABNORMAL HIGH (ref 19–32)
Calcium: 8.9 mg/dL (ref 8.4–10.5)
Chloride: 103 mEq/L (ref 96–112)
Chloride: 102 mEq/L (ref 96–112)
Creatinine, Ser: 0.73 mg/dL (ref 0.50–1.10)
GFR calc Af Amer: >90 mL/min (ref >90)
Glucose, Bld: 136 mg/dL — ABNORMAL HIGH (ref 70–99)
Potassium: 3.6 mEq/L (ref 3.5–5.1)
Sodium: 141 mEq/L (ref 135–145)

## 2011-04-21 MED ORDER — SODIUM CHLORIDE 0.9 % IV SOLN: 250.0000 mL | INTRAVENOUS | Status: DC | PRN

## 2011-04-21 MED ORDER — HEPARIN SODIUM (PORCINE) 5000 UNIT/ML IJ SOLN
5000.0000 [IU] | Freq: Three times a day (TID) | INTRAMUSCULAR | Status: DC
  Administered 2011-04-21 – 2011-04-24 (×10): 5000 [IU] via SUBCUTANEOUS
  Filled 2011-04-21 (×13): qty 1

## 2011-04-21 MED ORDER — FUROSEMIDE 10 MG/ML IJ SOLN
40.0000 mg | Freq: Once | INTRAMUSCULAR | Status: AC
  Administered 2011-04-21: 40 mg via INTRAVENOUS
  Filled 2011-04-21: qty 4

## 2011-04-21 MED ORDER — ARIPIPRAZOLE 5 MG PO TABS
5.0000 mg | ORAL_TABLET | Freq: Every day | ORAL | Status: DC
  Administered 2011-04-21 – 2011-04-24 (×4): 5 mg via ORAL
  Filled 2011-04-21 (×4): qty 1

## 2011-04-21 MED ORDER — FUROSEMIDE 10 MG/ML IJ SOLN
INTRAMUSCULAR | Status: AC
  Administered 2011-04-21: 20 mg
  Filled 2011-04-21: qty 4

## 2011-04-21 MED ORDER — ONDANSETRON HCL 4 MG PO TABS: 4.0000 mg | ORAL_TABLET | Freq: Four times a day (QID) | ORAL | Status: DC | PRN

## 2011-04-21 MED ORDER — LAMOTRIGINE 100 MG PO TABS
100.0000 mg | ORAL_TABLET | Freq: Every day | ORAL | Status: DC
  Administered 2011-04-21 – 2011-04-24 (×4): 100 mg via ORAL
  Filled 2011-04-21 (×4): qty 1

## 2011-04-21 MED ORDER — VANCOMYCIN HCL 1000 MG IV SOLR
1500.0000 mg | Freq: Once | INTRAVENOUS | Status: AC
  Administered 2011-04-21: 1500 mg via INTRAVENOUS
  Filled 2011-04-21 (×2): qty 1500

## 2011-04-21 MED ORDER — ONDANSETRON HCL 4 MG/2ML IJ SOLN: 4.0000 mg | Freq: Four times a day (QID) | INTRAMUSCULAR | Status: DC | PRN

## 2011-04-21 MED ORDER — FUROSEMIDE 10 MG/ML IJ SOLN
INTRAMUSCULAR | Status: AC
  Filled 2011-04-21: qty 4

## 2011-04-21 MED ORDER — GABAPENTIN 300 MG PO CAPS
300.0000 mg | ORAL_CAPSULE | Freq: Three times a day (TID) | ORAL | Status: DC
  Administered 2011-04-21 – 2011-04-24 (×9): 300 mg via ORAL
  Filled 2011-04-21 (×12): qty 1

## 2011-04-21 MED ORDER — TRAZODONE HCL 100 MG PO TABS
200.0000 mg | ORAL_TABLET | Freq: Every day | ORAL | Status: DC
  Administered 2011-04-21 – 2011-04-23 (×3): 200 mg via ORAL
  Filled 2011-04-21 (×4): qty 2

## 2011-04-21 MED ORDER — SODIUM CHLORIDE 0.9 % IJ SOLN: 3.0000 mL | INTRAMUSCULAR | Status: DC | PRN

## 2011-04-21 MED ORDER — VANCOMYCIN HCL IN DEXTROSE 1-5 GM/200ML-% IV SOLN
1000.0000 mg | Freq: Three times a day (TID) | INTRAVENOUS | Status: DC
  Administered 2011-04-21 – 2011-04-24 (×8): 1000 mg via INTRAVENOUS
  Filled 2011-04-21 (×11): qty 200

## 2011-04-21 MED ORDER — CLONAZEPAM 0.5 MG PO TABS
0.5000 mg | ORAL_TABLET | Freq: Three times a day (TID) | ORAL | Status: DC
  Administered 2011-04-21 – 2011-04-24 (×9): 0.5 mg via ORAL
  Filled 2011-04-21 (×10): qty 1

## 2011-04-21 MED ORDER — TRIAMCINOLONE ACETONIDE 0.5 % EX CREA
TOPICAL_CREAM | Freq: Two times a day (BID) | CUTANEOUS | Status: DC
  Administered 2011-04-21 – 2011-04-23 (×6): via TOPICAL
  Filled 2011-04-21 (×3): qty 15

## 2011-04-21 MED ORDER — INFLUENZA VIRUS VACC SPLIT PF IM SUSP
0.5000 mL | INTRAMUSCULAR | Status: AC
  Administered 2011-04-22: 0.5 mL via INTRAMUSCULAR
  Filled 2011-04-21: qty 0.5

## 2011-04-21 MED ORDER — DOCUSATE SODIUM 100 MG PO CAPS
100.0000 mg | ORAL_CAPSULE | Freq: Two times a day (BID) | ORAL | Status: DC
  Administered 2011-04-21 – 2011-04-22 (×4): 100 mg via ORAL
  Filled 2011-04-21 (×7): qty 1

## 2011-04-21 MED ORDER — OXYCODONE HCL 5 MG PO TABS
5.0000 mg | ORAL_TABLET | Freq: Four times a day (QID) | ORAL | Status: DC | PRN
  Administered 2011-04-21 – 2011-04-22 (×4): 5 mg via ORAL
  Filled 2011-04-21 (×5): qty 1

## 2011-04-21 MED ORDER — SODIUM CHLORIDE 0.9 % IJ SOLN
3.0000 mL | Freq: Two times a day (BID) | INTRAMUSCULAR | Status: DC
  Administered 2011-04-21 – 2011-04-23 (×6): 3 mL via INTRAVENOUS

## 2011-04-21 MED ORDER — VANCOMYCIN HCL IN DEXTROSE 1-5 GM/200ML-% IV SOLN
1000.0000 mg | Freq: Once | INTRAVENOUS | Status: AC
  Administered 2011-04-21: 1000 mg via INTRAVENOUS
  Filled 2011-04-21: qty 200

## 2011-04-21 MED ORDER — BUSPIRONE HCL 10 MG PO TABS
10.0000 mg | ORAL_TABLET | Freq: Two times a day (BID) | ORAL | Status: DC
  Administered 2011-04-21 – 2011-04-24 (×6): 10 mg via ORAL
  Filled 2011-04-21 (×9): qty 1

## 2011-04-21 MED ORDER — ACETAMINOPHEN 325 MG PO TABS
650.0000 mg | ORAL_TABLET | Freq: Four times a day (QID) | ORAL | Status: DC | PRN
  Filled 2011-04-21: qty 2

## 2011-04-21 MED ORDER — MORPHINE SULFATE 4 MG/ML IJ SOLN
4.0000 mg | Freq: Once | INTRAMUSCULAR | Status: AC
  Administered 2011-04-21: 4 mg via INTRAVENOUS
  Filled 2011-04-21: qty 1

## 2011-04-21 MED ORDER — HYDROMORPHONE HCL PF 1 MG/ML IJ SOLN
1.0000 mg | Freq: Once | INTRAMUSCULAR | Status: AC
  Administered 2011-04-21: 1 mg via INTRAVENOUS
  Filled 2011-04-21: qty 1

## 2011-04-21 MED ORDER — TRAMADOL HCL 50 MG PO TABS
50.0000 mg | ORAL_TABLET | Freq: Two times a day (BID) | ORAL | Status: DC
  Administered 2011-04-21 – 2011-04-24 (×7): 50 mg via ORAL
  Filled 2011-04-21 (×8): qty 1

## 2011-04-21 MED ORDER — FUROSEMIDE 10 MG/ML IJ SOLN
20.0000 mg | Freq: Four times a day (QID) | INTRAMUSCULAR | Status: AC
  Administered 2011-04-21 (×2): 20 mg via INTRAVENOUS

## 2011-04-21 MED ORDER — CITALOPRAM HYDROBROMIDE 40 MG PO TABS
40.0000 mg | ORAL_TABLET | Freq: Every day | ORAL | Status: DC
  Administered 2011-04-21 – 2011-04-24 (×4): 40 mg via ORAL
  Filled 2011-04-21 (×4): qty 1

## 2011-04-21 MED ORDER — ACETAMINOPHEN 650 MG RE SUPP: 650.0000 mg | Freq: Four times a day (QID) | RECTAL | Status: DC | PRN

## 2011-04-21 MED ORDER — ONDANSETRON HCL 4 MG/2ML IJ SOLN
4.0000 mg | Freq: Once | INTRAMUSCULAR | Status: AC
  Administered 2011-04-21: 4 mg via INTRAVENOUS
  Filled 2011-04-21: qty 2

## 2011-04-21 MED ORDER — BUPROPION HCL ER (SR) 150 MG PO TB12
150.0000 mg | ORAL_TABLET | Freq: Two times a day (BID) | ORAL | Status: DC
Start: 2011-04-21 — End: 2011-04-24
  Administered 2011-04-21 – 2011-04-24 (×6): 150 mg via ORAL
  Filled 2011-04-21 (×10): qty 1

## 2011-04-21 MED ORDER — SENNA 8.6 MG PO TABS: 2.0000 | ORAL_TABLET | Freq: Every day | ORAL | Status: DC | PRN

## 2011-04-21 NOTE — Progress Notes (Signed)
Utilization Review Completed.Shade Rivenbark T11/28/2012   

## 2011-04-21 NOTE — ED Provider Notes (Signed)
History     CSN: [id number] Arrival date & time: 04/20/2011  7:38 PM   First MD Initiated Contact with Patient 04/20/11 2333      Chief Complaint  Patient presents with  . Cellulitis    HPI  History provided by the patient in recent chart. Patient presents for a recheck up of left lower leg pain and swelling. Patient reports being seen in the emergency room last Friday, 4 days ago for similar complaints. Patient was diagnosed with left lower leg cellulitis and treated with clindamycin. Patient reports taking this as instructed. Today she states that left lower leg has become more painful and erythematous spreading up her leg towards the knee. Pain is worse with palpation or with walking. Patient denies any fever, chills, sweats, nausea or vomiting. Patient has history of morbid obesity and hypertension but has no other significant past medical history.   Past Medical History  Diagnosis Date  . Obesity   . Hypertension   . Anxiety   . Bipolar 1 disorder     Past Surgical History  Procedure Date  . Cesarean section   . Knee arthroplasty   . Tonsillectomy     History reviewed. No pertinent family history.  History  Substance Use Topics  . Smoking status: Current Everyday Smoker -- 10.0 packs/day for 40 years    Types: Cigarettes  . Smokeless tobacco: Not on file  . Alcohol Use: No    OB History    Grav Para Term Preterm Abortions TAB SAB Ect Mult Living                  Review of Systems  Constitutional: Negative for fever and chills.  Respiratory: Negative for cough and shortness of breath.   Cardiovascular: Negative for chest pain.  Gastrointestinal: Negative for nausea, vomiting and abdominal pain.  Neurological: Negative for light-headedness and headaches.  All other systems reviewed and are negative.    Allergies  Review of patient's allergies indicates no known allergies.  Home Medications   Current Outpatient Rx  Name Route Sig Dispense Refill  .  ARIPIPRAZOLE 5 MG PO TABS Oral Take 1 tablet (5 mg total) by mouth daily. 90 tablet 2  . BUPROPION HCL ER (SR) 150 MG PO TB12 Oral Take 1 tablet (150 mg total) by mouth 2 (two) times daily. 60 tablet 2  . BUSPIRONE HCL 10 MG PO TABS Oral Take 1 tablet (10 mg total) by mouth 2 (two) times daily. Take one by po bid 60 tablet 2  . CITALOPRAM HYDROBROMIDE 40 MG PO TABS Oral Take 40 mg by mouth daily.     Marland Kitchen CLINDAMYCIN HCL 150 MG PO CAPS Oral Take 2 capsules (300 mg total) by mouth 3 (three) times daily. 21 capsule 0  . CLONAZEPAM 0.5 MG PO TABS Oral Take 0.5 mg by mouth 3 (three) times daily.      Marland Kitchen GABAPENTIN 300 MG PO CAPS Oral Take 300 mg by mouth 3 (three) times daily.      Marland Kitchen HYDROCODONE-ACETAMINOPHEN 5-325 MG PO TABS Oral Take 1 tablet by mouth every 4 (four) hours as needed. For pain     . LAMOTRIGINE 100 MG PO TABS Oral Take 1 tablet (100 mg total) by mouth daily. 30 tablet 2  . TRAMADOL HCL 50 MG PO TABS Oral Take 50 mg by mouth 2 (two) times daily. Maximum dose= 8 tablets per day     . TRAZODONE HCL 100 MG PO TABS Oral Take 200  mg by mouth at bedtime.       BP 120/67  Pulse 71  Temp(Src) 97.9 F (36.6 C) (Oral)  Resp 20  SpO2 97%  Physical Exam  Nursing note and vitals reviewed. Constitutional: She is oriented to person, place, and time. She appears well-developed and well-nourished. No distress.       Morbidly obese  HENT:  Head: Normocephalic.  Cardiovascular: Normal rate, regular rhythm and normal heart sounds.   Pulmonary/Chest: Effort normal and breath sounds normal. She has no wheezes. She has no rales.  Abdominal: Soft.  Musculoskeletal: Normal range of motion. She exhibits edema and tenderness.       Diffuse swelling of left lower extremity with erythema and increased warmth. She also is mild swelling of right lower extremity with normal appearing skin. Patient has normal distal sensation and pulses in bilateral feet.  Neurological: She is oriented to person, place, and  time.  Skin: Skin is warm. There is erythema.  Psychiatric: She has a normal mood and affect. Her behavior is normal.    ED Course  Procedures (including critical care time)   Labs Reviewed  CBC  DIFFERENTIAL  BASIC METABOLIC PANEL   Results for orders placed during the hospital encounter of 04/20/11  CBC      Component Value Range   WBC 6.8  4.0 - 10.5 (K/uL)   RBC 4.18  3.87 - 5.11 (MIL/uL)   Hemoglobin 12.9  12.0 - 15.0 (g/dL)   HCT 16.1  09.6 - 04.5 (%)   MCV 90.0  78.0 - 100.0 (fL)   MCH 30.9  26.0 - 34.0 (pg)   MCHC 34.3  30.0 - 36.0 (g/dL)   RDW 40.9  81.1 - 91.4 (%)   Platelets 182  150 - 400 (K/uL)  DIFFERENTIAL      Component Value Range   Neutrophils Relative 62  43 - 77 (%)   Neutro Abs 4.2  1.7 - 7.7 (K/uL)   Lymphocytes Relative 30  12 - 46 (%)   Lymphs Abs 2.0  0.7 - 4.0 (K/uL)   Monocytes Relative 6  3 - 12 (%)   Monocytes Absolute 0.4  0.1 - 1.0 (K/uL)   Eosinophils Relative 2  0 - 5 (%)   Eosinophils Absolute 0.1  0.0 - 0.7 (K/uL)   Basophils Relative 0  0 - 1 (%)   Basophils Absolute 0.0  0.0 - 0.1 (K/uL)  BASIC METABOLIC PANEL      Component Value Range   Sodium 141  135 - 145 (mEq/L)   Potassium 3.6  3.5 - 5.1 (mEq/L)   Chloride 103  96 - 112 (mEq/L)   CO2 29  19 - 32 (mEq/L)   Glucose, Bld 110 (*) 70 - 99 (mg/dL)   BUN 14  6 - 23 (mg/dL)   Creatinine, Ser 7.82  0.50 - 1.10 (mg/dL)   Calcium 8.9  8.4 - 95.6 (mg/dL)   GFR calc non Af Amer >90  >90 (mL/min)   GFR calc Af Amer >90  >90 (mL/min)     1. Cellulitis of left lower extremity       MDM  12:15 AM patient seen and evaluated. Patient in no acute distress.  1:00 AM patient seen and discussed with attending physician. Patient with worsening cellulitis and failed outpatient treatment. Plan will be to call hospitalist for admission. Will start IV vancomycin now.   1:45 AM patient discussed with triad hospitalist. They will see and admit to a general  bed under team 6.   Angus Seller, PA 04/21/11 [phone]

## 2011-04-21 NOTE — Progress Notes (Signed)
ANTIBIOTIC CONSULT NOTE - INITIAL  Pharmacy Consult for vancomycin Indication: cellulitis  No Known Allergies  Patient Measurements: Height: 5\' 6"  (167.6 cm) Weight: 334 lb 14.1 oz (151.9 kg) IBW/kg (Calculated) : 59.3   Vital Signs: Temp: 97.8 F (36.6 C) (11/28 0430) Temp src: Oral (11/28 0255) BP: 134/74 mmHg (11/28 0430) Pulse Rate: 80  (11/28 0430)  Labs:  [facility] 04/21/11 0625 04/21/11 0023  WBC 6.4 6.8  HGB 12.2 12.9  PLT 180 182  LABCREA -- --  CREATININE -- 0.73   Estimated Creatinine Clearance: 120.8 ml/min (by C-G formula based on Cr of 0.73).  Microbiology: No results found for this or any previous visit (from the past 720 hour(s)).  Medical History: Past Medical History  Diagnosis Date  . Obesity   . Hypertension   . Anxiety   . Bipolar 1 disorder   . Angina    Medications:  Prescriptions prior to admission  Medication Sig Dispense Refill  . ARIPiprazole (ABILIFY) 5 MG tablet Take 1 tablet (5 mg total) by mouth daily.  90 tablet  2  . buPROPion (WELLBUTRIN SR) 150 MG 12 hr tablet Take 1 tablet (150 mg total) by mouth 2 (two) times daily.  60 tablet  2  . busPIRone (BUSPAR) 10 MG tablet Take 1 tablet (10 mg total) by mouth 2 (two) times daily. Take one by po bid  60 tablet  2  . citalopram (CELEXA) 40 MG tablet Take 40 mg by mouth daily.       . clindamycin (CLEOCIN) 150 MG capsule Take 2 capsules (300 mg total) by mouth 3 (three) times daily.  21 capsule  0  . clonazePAM (KLONOPIN) 0.5 MG tablet Take 0.5 mg by mouth 3 (three) times daily.        Marland Kitchen gabapentin (NEURONTIN) 300 MG capsule Take 300 mg by mouth 3 (three) times daily.        Marland Kitchen HYDROcodone-acetaminophen (NORCO) 5-325 MG per tablet Take 1 tablet by mouth every 4 (four) hours as needed. For pain       . lamoTRIgine (LAMICTAL) 100 MG tablet Take 1 tablet (100 mg total) by mouth daily.  30 tablet  2  . traMADol (ULTRAM) 50 MG tablet Take 50 mg by mouth 2 (two) times daily. Maximum dose= 8  tablets per day       . traZODone (DESYREL) 100 MG tablet Take 200 mg by mouth at bedtime.        Scheduled:    . ARIPiprazole  5 mg Oral Daily  . buPROPion  150 mg Oral BID  . busPIRone  10 mg Oral BID  . citalopram  40 mg Oral Daily  . clonazePAM  0.5 mg Oral TID  . docusate sodium  100 mg Oral BID  . furosemide  20 mg Intravenous Q6H  . furosemide  40 mg Intravenous Once  . gabapentin  300 mg Oral TID  . heparin  5,000 Units Subcutaneous Q8H  .  HYDROmorphone (DILAUDID) injection  1 mg Intravenous Once  . influenza  inactive virus vaccine  0.5 mL Intramuscular Tomorrow-1000  . lamoTRIgine  100 mg Oral Daily  .  morphine injection  4 mg Intravenous Once  . ondansetron (ZOFRAN) IV  4 mg Intravenous Once  . sodium chloride  3 mL Intravenous Q12H  . traMADol  50 mg Oral BID  . traZODone  200 mg Oral QHS  . triamcinolone cream   Topical BID  . vancomycin  1,000 mg Intravenous Once  Assessment: 55yo female c/o pain and erythema of LLE, to begin IV ABX for cellulitis.  Goal of Therapy:  Vancomycin trough level 10-15 mcg/ml  Plan:  Rec'd 1000mg  of vanc in ED; will give additional vanc 1500mg  to complete load  followed by 1000mg  IV Q8H and monitor CBC, Cx, levels prn.  Colleen Can PharmD BCPS 04/21/2011,7:55 AM

## 2011-04-21 NOTE — Progress Notes (Signed)
*  PRELIMINARY RESULTS* LLEV has been performed. No obvious deep vein thrombosis involving the left lower extremity of the visualized veins. Not able to visualize the distal femoral vein. No obvious evidence of Baker's Cyst on the left.   Madison Wells 04/21/2011, 11:45 AM

## 2011-04-21 NOTE — Progress Notes (Signed)
Subjective: No new complaints, comfortable.  Objective: Vital signs in last 24 hours: Filed Vitals:   04/21/11 0112 04/21/11 0255 04/21/11 0430 04/21/11 0617  BP: 114/70 121/77 134/74   Pulse: 70 70 80   Temp: 97.4 F (36.3 C) 97.5 F (36.4 C) 97.8 F (36.6 C)   TempSrc: Oral Oral    Resp: 18 20 18    Height:    5\' 6"  (1.676 m)  Weight:    151.9 kg (334 lb 14.1 oz)  SpO2: 94% 94% 96%    Weight change:   Intake/Output Summary (Last 24 hours) at 04/21/11 1149 Last data filed at 04/21/11 0558  Gross per 24 hour  Intake    120 ml  Output      0 ml  Net    120 ml   Physical Exam:   General Appearance:    Alert, cooperative, no distress, appears stated age  Lungs:     Clear to auscultation bilaterally, respirations unlabored   Heart:    Regular rate and rhythm, S1 and S2 normal, no murmur, rub   or gallop  Abdomen:     Soft, non-tender, bowel sounds active all four quadrants,    Extremities:   Left lower extremity swelling and redness, and tenderness present. Right lower extremity swelling present.   Pulses:   2+ and symmetric all extremities     Neurologic:   CNII-XII intact, normal strength, sensation and reflexes    throughout     Lab Results: Results for orders placed during the hospital encounter of 04/20/11 (from the past 24 hour(s))  CBC     Status: Normal   Collection Time   04/21/11 12:23 AM      Component Value Range   WBC 6.8  4.0 - 10.5 (K/uL)   RBC 4.18  3.87 - 5.11 (MIL/uL)   Hemoglobin 12.9  12.0 - 15.0 (g/dL)   HCT 09.8  11.9 - 14.7 (%)   MCV 90.0  78.0 - 100.0 (fL)   MCH 30.9  26.0 - 34.0 (pg)   MCHC 34.3  30.0 - 36.0 (g/dL)   RDW 82.9  56.2 - 13.0 (%)   Platelets 182  150 - 400 (K/uL)  DIFFERENTIAL     Status: Normal   Collection Time   04/21/11 12:23 AM      Component Value Range   Neutrophils Relative 62  43 - 77 (%)   Neutro Abs 4.2  1.7 - 7.7 (K/uL)   Lymphocytes Relative 30  12 - 46 (%)   Lymphs Abs 2.0  0.7 - 4.0 (K/uL)   Monocytes  Relative 6  3 - 12 (%)   Monocytes Absolute 0.4  0.1 - 1.0 (K/uL)   Eosinophils Relative 2  0 - 5 (%)   Eosinophils Absolute 0.1  0.0 - 0.7 (K/uL)   Basophils Relative 0  0 - 1 (%)   Basophils Absolute 0.0  0.0 - 0.1 (K/uL)  BASIC METABOLIC PANEL     Status: Abnormal   Collection Time   04/21/11 12:23 AM      Component Value Range   Sodium 141  135 - 145 (mEq/L)   Potassium 3.6  3.5 - 5.1 (mEq/L)   Chloride 103  96 - 112 (mEq/L)   CO2 29  19 - 32 (mEq/L)   Glucose, Bld 110 (*) 70 - 99 (mg/dL)   BUN 14  6 - 23 (mg/dL)   Creatinine, Ser 8.65  0.50 - 1.10 (mg/dL)   Calcium 8.9  8.4 - 10.5 (mg/dL)   GFR calc non Af Amer >90  >90 (mL/min)   GFR calc Af Amer >90  >90 (mL/min)  BASIC METABOLIC PANEL     Status: Abnormal   Collection Time   04/21/11  6:25 AM      Component Value Range   Sodium 141  135 - 145 (mEq/L)   Potassium 3.4 (*) 3.5 - 5.1 (mEq/L)   Chloride 102  96 - 112 (mEq/L)   CO2 33 (*) 19 - 32 (mEq/L)   Glucose, Bld 136 (*) 70 - 99 (mg/dL)   BUN 11  6 - 23 (mg/dL)   Creatinine, Ser 1.61  0.50 - 1.10 (mg/dL)   Calcium 8.9  8.4 - 09.6 (mg/dL)   GFR calc non Af Amer >90  >90 (mL/min)   GFR calc Af Amer >90  >90 (mL/min)  CBC     Status: Normal   Collection Time   04/21/11  6:25 AM      Component Value Range   WBC 6.4  4.0 - 10.5 (K/uL)   RBC 4.13  3.87 - 5.11 (MIL/uL)   Hemoglobin 12.2  12.0 - 15.0 (g/dL)   HCT 04.5  40.9 - 81.1 (%)   MCV 90.8  78.0 - 100.0 (fL)   MCH 29.5  26.0 - 34.0 (pg)   MCHC 32.5  30.0 - 36.0 (g/dL)   RDW 91.4  78.2 - 95.6 (%)   Platelets 180  150 - 400 (K/uL)  \ Micro Results: No results found for this or any previous visit (from the past 240 hour(s)). Studies/Results: No results found. Medications: reviewed.  Scheduled Meds:   . ARIPiprazole  5 mg Oral Daily  . buPROPion  150 mg Oral BID  . busPIRone  10 mg Oral BID  . citalopram  40 mg Oral Daily  . clonazePAM  0.5 mg Oral TID  . docusate sodium  100 mg Oral BID  . furosemide   20 mg Intravenous Q6H  . furosemide  40 mg Intravenous Once  . gabapentin  300 mg Oral TID  . heparin  5,000 Units Subcutaneous Q8H  .  HYDROmorphone (DILAUDID) injection  1 mg Intravenous Once  . influenza  inactive virus vaccine  0.5 mL Intramuscular Tomorrow-1000  . lamoTRIgine  100 mg Oral Daily  .  morphine injection  4 mg Intravenous Once  . ondansetron (ZOFRAN) IV  4 mg Intravenous Once  . sodium chloride  3 mL Intravenous Q12H  . traMADol  50 mg Oral BID  . traZODone  200 mg Oral QHS  . triamcinolone cream   Topical BID  . vancomycin  1,500 mg Intravenous Once  . vancomycin  1,000 mg Intravenous Once  . vancomycin  1,000 mg Intravenous Q8H   Continuous Infusions:  PRN Meds:.sodium chloride, acetaminophen, acetaminophen, ondansetron (ZOFRAN) IV, ondansetron, oxyCODONE, senna, sodium chloride Assessment/Plan: Principal Problem: Left lower extremity cellulitis: on iv vancomycin. m onitor for 24 hrs on iv vanco. Venous dopplers negative for DVT. Continue with pain meds. Elevate the legs. And steroid cream for possible venous stasis dermatitis.  Active Problems:  Obesity  Hypertension controlled.  Anxiety  Bipolar 1 disorder ocntinue with home meds    LOS: 1 day   Madison Wells 04/21/2011, 11:49 AM

## 2011-04-21 NOTE — H&P (Signed)
PCP:   LYALL,MONICA, NP, NP   Chief Complaint:  LLE swelling and redness   HPI:  55yoF with h/o bipolar disorder, HTN, obesity.   Pt was seen in the ED 11/23 for erythema and edema of LLE. Ddimer was negative so they  did not ultrasound her, and she also did not have risk factors for DVT. They thought  exam most consistent with cellulitis, so discharged from ED with Clindamycin and  vicodin. Comes back with increased pain, swelling, redness. The PA who saw her a couple  days ago saw her again tonight and felt that it was much worse.   In the ED vitals were stable. Chemistry panel was normal including renal fxn, CBC  normal. She was given morphine and zofran, and has been ordered for Vancomycin and 40  mg IV Lasix. Admission reqeusted for failed outpt therapy of red, swollen leg.   The patient herself states that her legs chronically swell, of unclear diagnosis. 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 She went to ED Friday as above, and  states she took the ABX but they didn't help. She also notes that now the right leg is  a bit red as well.  She denies fevers, chills, sweats, feeling systemically ill, malaise. ROS o/w negative.   Past Medical History  Diagnosis Date  . Obesity   . Hypertension   . Anxiety   . Bipolar 1 disorder     Past Surgical History  Procedure Date  . Cesarean section   . Knee arthroplasty   . Tonsillectomy     Medications:  HOME MEDS: Prior to Admission medications   Medication Sig Start Date End Date Taking? Authorizing Provider  ARIPiprazole (ABILIFY) 5 MG tablet Take 1 tablet (5 mg total) by mouth daily. 04/13/11 05/13/11 Yes Verne Spurr, PA  buPROPion (WELLBUTRIN SR) 150 MG 12 hr tablet Take 1 tablet (150 mg total) by mouth 2 (two) times daily. 04/13/11  Yes Verne Spurr, PA  busPIRone (BUSPAR) 10 MG tablet Take 1 tablet (10 mg total) by  mouth 2 (two) times daily. Take one by po bid 04/13/11  Yes Verne Spurr, PA  citalopram (CELEXA) 40 MG tablet Take 40 mg by mouth daily.  04/13/11  Yes Verne Spurr, PA  clindamycin (CLEOCIN) 150 MG capsule Take 2 capsules (300 mg total) by mouth 3 (three) times daily. 04/16/11 04/26/11 Yes Catherine E Schinlever, PA  clonazePAM (KLONOPIN) 0.5 MG tablet Take 0.5 mg by mouth 3 (three) times daily.     Yes Historical Provider, MD  gabapentin (NEURONTIN) 300 MG capsule Take 300 mg by mouth 3 (three) times daily.     Yes Historical Provider, MD  HYDROcodone-acetaminophen (NORCO) 5-325 MG per tablet Take 1 tablet by mouth every 4 (four) hours as needed. For pain  04/16/11 04/26/11 Yes Arie Sabina Schinlever, PA  lamoTRIgine (LAMICTAL) 100 MG tablet Take 1 tablet (100 mg total) by mouth daily. 04/13/11  Yes Verne Spurr, PA  traMADol (ULTRAM) 50 MG tablet Take 50 mg by mouth 2 (two) times daily. Maximum dose= 8 tablets per day    Yes Historical Provider, MD  traZODone (DESYREL) 100 MG tablet Take 200 mg by mouth at bedtime.    Yes Historical Provider, MD    Allergies:  No Known Allergies  Social History:   reports that she has been smoking Cigarettes.  She has a 400  pack-year smoking history. She does not have any smokeless tobacco history on file. She reports that she does not drink alcohol or use illicit drugs.  Family History: History reviewed. No pertinent family history.  Physical Exam: Filed Vitals:   04/20/11 1941 04/20/11 2339 04/21/11 0112 04/21/11 0255  BP: 135/64 120/67 114/70 121/77  Pulse: 86 71 70 70  Temp: 97.8 F (36.6 C) 97.9 F (36.6 C) 97.4 F (36.3 C) 97.5 F (36.4 C)  TempSrc: Oral Oral Oral Oral  Resp: 24 20 18 20   SpO2: 100% 97% 94% 94%   Blood pressure 121/77, pulse 70, temperature 97.5 F (36.4 C), temperature source Oral, resp. rate 20, SpO2 94.00%.  Gen: Quite obese lady in ED stretcher, appears well in no distress, able to relate  history.  HEENT: PERRL,  EOMI, sclera clear, normal appearing. Mouth normal Lungs: CTAB no adventitious lung sounds, normal Heart: RRR, no m/g, benign exam.  Abd: Very obese, but soft, non tender, non distended, benign overall Extremities: BUE are normal with palpable radials. LLE has soft pitting edema on  background of pink erythema, minimal warmth, extends to 3/4 up shin, and is TTP, with  indistinct margins. RLE is much less impressive, but the area that the pt identifies as  now red does in fact have a very faint, minimal amt of redness, that is much less  tender.  Neuro: Grossly non-focal, pt is conversant, fluent, moving extremities, moves in ED bed  without assistance    Labs & Imaging Results for orders placed during the hospital encounter of 04/20/11 (from the past 48 hour(s))  CBC     Status: Normal   Collection Time   04/21/11 12:23 AM      Component Value Range Comment   WBC 6.8  4.0 - 10.5 (K/uL)    RBC 4.18  3.87 - 5.11 (MIL/uL)    Hemoglobin 12.9  12.0 - 15.0 (g/dL)    HCT 16.1  09.6 - 04.5 (%)    MCV 90.0  78.0 - 100.0 (fL)    MCH 30.9  26.0 - 34.0 (pg)    MCHC 34.3  30.0 - 36.0 (g/dL)    RDW 40.9  81.1 - 91.4 (%)    Platelets 182  150 - 400 (K/uL)   DIFFERENTIAL     Status: Normal   Collection Time   04/21/11 12:23 AM      Component Value Range Comment   Neutrophils Relative 62  43 - 77 (%)    Neutro Abs 4.2  1.7 - 7.7 (K/uL)    Lymphocytes Relative 30  12 - 46 (%)    Lymphs Abs 2.0  0.7 - 4.0 (K/uL)    Monocytes Relative 6  3 - 12 (%)    Monocytes Absolute 0.4  0.1 - 1.0 (K/uL)    Eosinophils Relative 2  0 - 5 (%)    Eosinophils Absolute 0.1  0.0 - 0.7 (K/uL)    Basophils Relative 0  0 - 1 (%)    Basophils Absolute 0.0  0.0 - 0.1 (K/uL)   BASIC METABOLIC PANEL     Status: Abnormal   Collection Time   04/21/11 12:23 AM      Component Value Range Comment   Sodium 141  135 - 145 (mEq/L)    Potassium 3.6  3.5 - 5.1 (mEq/L)    Chloride 103  96 - 112 (mEq/L)    CO2 29  19 - 32  (mEq/L)    Glucose, Bld 110 (*)  70 - 99 (mg/dL)    BUN 14  6 - 23 (mg/dL)    Creatinine, Ser 1.61  0.50 - 1.10 (mg/dL)    Calcium 8.9  8.4 - 10.5 (mg/dL)    GFR calc non Af Amer >90  >90 (mL/min)    GFR calc Af Amer >90  >90 (mL/min)    No results found.  Impression Present on Admission:  .Leg swelling .Obesity .Hypertension .Anxiety .Bipolar 1 disorder   55yoF with h/o bipolar disorder, HTN, obesity who was evaluated in ED last Friday for  fairly acute onset of redness, swelling, erythema of her LLE and was discharged on  Clindamycin. She now returns with no improvement in symptoms, and also some spread of  erythema to the RLE.   1. LLE erythema: DDx is cellulitis vs venous stasis dermatitis vs DVT. Cellulitis is  reasonable consideration, except erythema now spreading to contralateral leg makes this  a bit less likely, and also despite a fair extent of erythema and pain, she doesn't  have leukocytosis, fevers, or feel ill. Venous stasis dermatitis is reasonable given  her body habitus, h/o chronic leg swelling, failure of antibiotics, and rapid onset  after being on her feet Wednesday cooking for Thanksgiving. DVT seems less likely given  negative Ddimer 5 days ago, however she does have unilateral swelling and has failed  outpt ABx, so ruling this out I think is warranted at this point.   - Doppler LLE  - ABx: broaden to Vancomycin  - Topical steroids for venous stasis dermatitis: medium potency triamcinolone BID for  1-2 weeks.  - Lasix diuresis: 20 mg IV q6 x3 doses, titrate by MD thereafter. Renal fxn normal.   2. Bipolar d/o: continue home clonazepam, celexa, abilify, buproprion, buspar,  lamictal, trazodone  Continue home Neurontin, Tramadol  Regular bed, team 6  Presumed full code, not fully discussed   Other plans as per orders.  Cashus Halterman 04/21/2011, 4:08 AM

## 2011-04-21 NOTE — ED Provider Notes (Signed)
I have personally performed and participated in all the services and procedures documented herein. I have reviewed the findings with the patient.   Dione Booze, MD 04/21/11 [phone]

## 2011-04-21 NOTE — Progress Notes (Signed)
55 year old female has been treated for cellulitis of the legs for the last 4 days with oral clindamycin. Her symptoms are getting worse. On for cellulitis which has failed to respond to outpatient management. exam, there is significant erythema of the lower legs which is more pronounced on the left. There is also warmth of the left lower leg. There is 3+ pitting edema and delayed capillary refill. Vascular insufficiency is probably part of the reason she has failed to respond. She will need to be admitted

## 2011-04-21 NOTE — ED Notes (Signed)
Admitting MD at bedside.

## 2011-04-21 NOTE — ED Notes (Signed)
Medicated for pain and nausea per orders.

## 2011-04-22 MED ORDER — INFLUENZA VIRUS VACC SPLIT PF IM SUSP
0.5000 mL | INTRAMUSCULAR | Status: AC
  Filled 2011-04-22: qty 0.5

## 2011-04-22 MED ORDER — POTASSIUM CHLORIDE CRYS ER 20 MEQ PO TBCR
40.0000 meq | EXTENDED_RELEASE_TABLET | Freq: Once | ORAL | Status: AC
  Administered 2011-04-22: 40 meq via ORAL
  Filled 2011-04-22: qty 2

## 2011-04-22 MED ORDER — OXYCODONE HCL 5 MG PO TABS
5.0000 mg | ORAL_TABLET | ORAL | Status: DC | PRN
  Administered 2011-04-22 – 2011-04-24 (×5): 5 mg via ORAL
  Filled 2011-04-22 (×5): qty 1

## 2011-04-22 MED ORDER — MORPHINE SULFATE 2 MG/ML IJ SOLN
0.5000 mg | INTRAMUSCULAR | Status: DC | PRN
  Administered 2011-04-22: 0.5 mg via INTRAVENOUS
  Filled 2011-04-22: qty 1

## 2011-04-22 MED ORDER — FUROSEMIDE 40 MG PO TABS
40.0000 mg | ORAL_TABLET | ORAL | Status: DC
  Administered 2011-04-23 – 2011-04-24 (×2): 40 mg via ORAL
  Filled 2011-04-22 (×3): qty 1

## 2011-04-22 NOTE — Progress Notes (Signed)
Subjective: COMFORTABLE. Objective: Vital signs in last 24 hours: Filed Vitals:   04/21/11 0617 04/21/11 1400 04/21/11 2130 04/22/11 0600  BP:  143/67 118/61 101/59  Pulse:  79 69 67  Temp:  97.3 F (36.3 C) 97.7 F (36.5 C) 97.8 F (36.6 C)  TempSrc:  Oral  Oral  Resp:  17 18 18   Height: 5\' 6"  (1.676 m)     Weight: 151.9 kg (334 lb 14.1 oz)   151.9 kg (334 lb 14.1 oz)  SpO2:  93% 93% 100%   Weight change: 0 kg (0 lb)  Intake/Output Summary (Last 24 hours) at 04/22/11 1419 Last data filed at 04/22/11 1221  Gross per 24 hour  Intake   2690 ml  Output   2600 ml  Net     90 ml   Physical Exam:   General Appearance:    Alert, cooperative, no distress, appears stated age  Lungs:     Clear to auscultation bilaterally, respirations unlabored   Heart:    Regular rate and rhythm, S1 and S2 normal, no murmur, rub   or gallop  Abdomen:     Soft, non-tender, bowel sounds active all four quadrants,    no masses, no organomegaly  Extremities:   Extremities 3+ edema.  Left lower extremity Erythema and tenderness present.   Pulses:   2+ and symmetric all extremities  Skin:   Skin color, texture, turgor normal, no rashes or lesions  Neurologic:   CNII-XII intact, normal strength, sensation and reflexes    throughout     Lab Results: Results for orders placed during the hospital encounter of 04/20/11 (from the past 24 hour(s))  GLUCOSE, CAPILLARY     Status: Abnormal   Collection Time   04/22/11  8:28 AM      Component Value Range   Glucose-Capillary 105 (*) 70 - 99 (mg/dL)    Micro Results: No results found for this or any previous visit (from the past 240 hour(s)). Studies/Results: No results found. Medications: reviewed.  Scheduled Meds:   . ARIPiprazole  5 mg Oral Daily  . buPROPion  150 mg Oral BID  . busPIRone  10 mg Oral BID  . citalopram  40 mg Oral Daily  . clonazePAM  0.5 mg Oral TID  . docusate sodium  100 mg Oral BID  . furosemide      . furosemide      .  furosemide  20 mg Intravenous Q6H  . gabapentin  300 mg Oral TID  . heparin  5,000 Units Subcutaneous Q8H  . influenza  inactive virus vaccine  0.5 mL Intramuscular Tomorrow-1000  . lamoTRIgine  100 mg Oral Daily  . sodium chloride  3 mL Intravenous Q12H  . traMADol  50 mg Oral BID  . traZODone  200 mg Oral QHS  . triamcinolone cream   Topical BID  . vancomycin  1,000 mg Intravenous Q8H   Continuous Infusions:  PRN Meds:.sodium chloride, acetaminophen, acetaminophen, ondansetron (ZOFRAN) IV, ondansetron, oxyCODONE, senna, sodium chloride Assessment/Plan:   Left lower extremity cellulitis: on iv vancomycin. m onitor for 24 hrs on iv vanco. Venous dopplers negative for DVT. Continue with pain meds. Elevate the legs. And steroid cream for possible venous stasis dermatitis.   Bilateral lower extremity edema: will start pt on lasix and get a 2d echocardiogram.  Obesity  Hypertension controlled.  Anxiety  Bipolar 1 disorder ocntinue with home meds      LOS: 2 days   Madison Wells 04/22/2011, 2:19 PM

## 2011-04-23 DIAGNOSIS — I517 Cardiomegaly: Secondary | ICD-10-CM

## 2011-04-23 LAB — BASIC METABOLIC PANEL
BUN: 10 mg/dL (ref 6–23)
CO2: 32 mEq/L (ref 19–32)
Chloride: 102 mEq/L (ref 96–112)
Glucose, Bld: 95 mg/dL (ref 70–99)
Potassium: 4.5 mEq/L (ref 3.5–5.1)

## 2011-04-23 LAB — VANCOMYCIN, TROUGH: Vancomycin Tr: 13.2 ug/mL (ref 10.0–20.0)

## 2011-04-23 NOTE — Progress Notes (Signed)
ANTIBIOTIC CONSULT NOTE - FOLLOW UP  Pharmacy Consult for Vancomycin IV Indication: cellulitis  No Known Allergies  Patient Measurements: Height: 5\' 6"  (167.6 cm) Weight: 334 lb 14.1 oz (151.9 kg) IBW/kg (Calculated) : 59.3   Vital Signs: Temp: 97.6 F (36.4 C) (11/30 1300) Temp src: Oral (11/30 1300) BP: 135/74 mmHg (11/30 1300) Pulse Rate: 59  (11/30 1300) Intake/Output from previous day: 11/29 0701 - 11/30 0700 In: 300 [I.V.:100; IV Piggyback:200] Out: 3100 [Urine:3100] Intake/Output from this shift: Total I/O In: 1320 [P.O.:1220; I.V.:100] Out: 2450 [Urine:2450]  Labs:  [facility] 04/23/11 0630 04/21/11 0625 04/21/11 0023  WBC -- 6.4 6.8  HGB -- 12.2 12.9  PLT -- 180 182  LABCREA -- -- --  CREATININE 0.73 0.75 0.73   Estimated Creatinine Clearance: 120.8 ml/min (by C-G formula based on Cr of 0.73).  Basename 04/23/11 1514  VANCOTROUGH 13.2  VANCOPEAK --  VANCORANDOM --  GENTTROUGH --  GENTPEAK --  GENTRANDOM --  TOBRATROUGH --  TOBRAPEAK --  TOBRARND --  AMIKACINPEAK --  AMIKACINTROU --  AMIKACIN --     Microbiology: No results found for this or any previous visit (from the past 720 hour(s)).  Anti-infectives     Start     Dose/Rate Route Frequency Ordered Stop   04/21/11 1600   vancomycin (VANCOCIN) IVPB 1000 mg/200 mL premix        1,000 mg 200 mL/hr over 60 Minutes Intravenous Every 8 hours 04/21/11 0800     04/21/11 0800   vancomycin (VANCOCIN) 1,500 mg in sodium chloride 0.9 % 500 mL IVPB        1,500 mg 250 mL/hr over 120 Minutes Intravenous  Once 04/21/11 0800 04/21/11 1217   04/21/11 0130   vancomycin (VANCOCIN) IVPB 1000 mg/200 mL premix        1,000 mg 200 mL/hr over 60 Minutes Intravenous  Once 04/21/11 0123 04/21/11 0251          Assessment: 55yo F with cellulitis on day #3 of IV vancomycin. She is afebrile with a normal wbc. Vancomycin trough is therapeutic for goal 10-15 mcg/mL. SCr is stable. UOP~1.6cc/kg/hr. No issues with  vancomycin per RN.   Goal of Therapy:  Vancomycin trough level 10-15 mcg/ml  Plan:  1. Continue Vancomycin 1g IV q8h.  2. Will follow-up clinical status and renal function. Will draw levels if appropriate.  3. Will follow along with team for duration of therapy.   Fayne Norrie 04/23/2011,4:50 PM

## 2011-04-23 NOTE — Progress Notes (Signed)
  Echocardiogram 2D Echocardiogram has been performed.  Dewitt Hoes, RDCS 04/23/2011, 9:38 AM

## 2011-04-23 NOTE — Progress Notes (Signed)
Subjective: Erythema improved. Pain is better Objective: Vital signs in last 24 hours: Filed Vitals:   04/22/11 1400 04/22/11 2159 04/23/11 0654 04/23/11 1300  BP: 132/87 95/74 134/72 135/74  Pulse: 61 75 71 59  Temp: 97.5 F (36.4 C) 98.4 F (36.9 C) 97.8 F (36.6 C) 97.6 F (36.4 C)  TempSrc: Oral Oral Oral Oral  Resp: 17 18 18 18   Height:      Weight:      SpO2: 100% 98% 95% 99%   Weight change:   Intake/Output Summary (Last 24 hours) at 04/23/11 1714 Last data filed at 04/23/11 1500  Gross per 24 hour  Intake   1320 ml  Output   4950 ml  Net  -3630 ml   Physical Exam: Alert and oriented s1 and s2 heard Lungs clear abd soft and obese , non tender Ext b/l 3 + edema and erythema on the left le improved.  Lab Results: Results for orders placed during the hospital encounter of 04/20/11 (from the past 24 hour(s))  BASIC METABOLIC PANEL     Status: Normal   Collection Time   04/23/11  6:30 AM      Component Value Range   Sodium 141  135 - 145 (mEq/L)   Potassium 4.5  3.5 - 5.1 (mEq/L)   Chloride 102  96 - 112 (mEq/L)   CO2 32  19 - 32 (mEq/L)   Glucose, Bld 95  70 - 99 (mg/dL)   BUN 10  6 - 23 (mg/dL)   Creatinine, Ser 0.45  0.50 - 1.10 (mg/dL)   Calcium 9.4  8.4 - 40.9 (mg/dL)   GFR calc non Af Amer >90  >90 (mL/min)   GFR calc Af Amer >90  >90 (mL/min)  GLUCOSE, CAPILLARY     Status: Abnormal   Collection Time   04/23/11  7:44 AM      Component Value Range   Glucose-Capillary 104 (*) 70 - 99 (mg/dL)  VANCOMYCIN, TROUGH     Status: Normal   Collection Time   04/23/11  3:14 PM      Component Value Range   Vancomycin Tr 13.2  10.0 - 20.0 (ug/mL)    Micro Results: No results found for this or any previous visit (from the past 240 hour(s)). Studies/Results: No results found. Medications: improved.  Scheduled Meds:   . ARIPiprazole  5 mg Oral Daily  . buPROPion  150 mg Oral BID  . busPIRone  10 mg Oral BID  . citalopram  40 mg Oral Daily  . clonazePAM   0.5 mg Oral TID  . docusate sodium  100 mg Oral BID  . furosemide  40 mg Oral Q24H  . gabapentin  300 mg Oral TID  . heparin  5,000 Units Subcutaneous Q8H  . influenza  inactive virus vaccine  0.5 mL Intramuscular Tomorrow-1000  . lamoTRIgine  100 mg Oral Daily  . potassium chloride  40 mEq Oral Once  . sodium chloride  3 mL Intravenous Q12H  . traMADol  50 mg Oral BID  . traZODone  200 mg Oral QHS  . triamcinolone cream   Topical BID  . vancomycin  1,000 mg Intravenous Q8H   Continuous Infusions:  PRN Meds:.sodium chloride, acetaminophen, acetaminophen, morphine injection, ondansetron (ZOFRAN) IV, ondansetron, oxyCODONE, senna, sodium chloride Assessment/Plan:   cellulitis with a component of stasis dermatitis : on vanco and steroid cream.  Obesity  Hypertension  Anxiety  Bipolar 1 disorder B/l LE edema:  2  Echo results  pending. o nlasix 40 mg daily.    LOS: 3 days   Madison Wells 04/23/2011, 5:14 PM

## 2011-04-24 MED ORDER — DOXYCYCLINE HYCLATE 100 MG PO TABS: 100.0000 mg | ORAL_TABLET | Freq: Two times a day (BID) | ORAL | Status: AC

## 2011-04-24 MED ORDER — TRIAMCINOLONE ACETONIDE 0.5 % EX CREA: TOPICAL_CREAM | Freq: Two times a day (BID) | CUTANEOUS | Status: AC

## 2011-04-24 MED ORDER — FUROSEMIDE 40 MG PO TABS: 40.0000 mg | ORAL_TABLET | ORAL | Status: DC

## 2011-04-24 MED ORDER — OXYCODONE HCL 5 MG PO TABS: 5.0000 mg | ORAL_TABLET | ORAL | Status: AC | PRN

## 2011-04-24 MED ORDER — AMOXICILLIN 500 MG PO CAPS: 500.0000 mg | ORAL_CAPSULE | Freq: Two times a day (BID) | ORAL | Status: AC

## 2011-04-24 NOTE — Progress Notes (Signed)
Iv dc'd, prescriptions given, home meds gone over, dc instructions gone over, diet, activity, good hand washing all discussed. Patient to follow up with primary care physician. Cell phone, charger and cane with patient. Patient verbalized understanding of instructions. Yvone Neu, RN

## 2011-04-24 NOTE — Discharge Summary (Signed)
PATIENT DETAILS Name: Madison Wells Age: 55 y.o. Sex: female Date of Birth: 11-Feb-1956 MRN: [id number]. Admit Date: 04/20/2011 Admitting Physician: Eddie North, MD WUJ:WJXBJ,YNWGNF, NP, NP  PRIMARY DISCHARGE DIAGNOSIS: 1. Left lower extremity cellulitis: 2. Chronic venous stasis ulcer. 3. HTN. 4. Obesity 5. Anxiety  6. Bipolar 1 disorder      PAST MEDICAL HISTORY: Past Medical History  Diagnosis Date  . Obesity   . Hypertension   . Anxiety   . Bipolar 1 disorder   . Angina     DISCHARGE MEDICATIONS: Current Discharge Medication List    START taking these medications   Details  amoxicillin (AMOXIL) 500 MG capsule Take 1 capsule (500 mg total) by mouth 2 (two) times daily. Qty: 8 capsule, Refills: 0    doxycycline (VIBRA-TABS) 100 MG tablet Take 1 tablet (100 mg total) by mouth 2 (two) times daily. Qty: 8 tablet, Refills: 0    furosemide (LASIX) 40 MG tablet Take 1 tablet (40 mg total) by mouth daily. Qty: 30 tablet, Refills: 1    oxyCODONE (OXY IR/ROXICODONE) 5 MG immediate release tablet Take 1 tablet (5 mg total) by mouth every 4 (four) hours as needed. Qty: 30 tablet, Refills: 0    triamcinolone cream (KENALOG) 0.5 % Apply topically 2 (two) times daily. Qty: 30 g, Refills: 0      CONTINUE these medications which have NOT CHANGED   Details  ARIPiprazole (ABILIFY) 5 MG tablet Take 1 tablet (5 mg total) by mouth daily. Qty: 90 tablet, Refills: 2    buPROPion (WELLBUTRIN SR) 150 MG 12 hr tablet Take 1 tablet (150 mg total) by mouth 2 (two) times daily. Qty: 60 tablet, Refills: 2    busPIRone (BUSPAR) 10 MG tablet Take 1 tablet (10 mg total) by mouth 2 (two) times daily. Take one by po bid Qty: 60 tablet, Refills: 2    citalopram (CELEXA) 40 MG tablet Take 40 mg by mouth daily.     clonazePAM (KLONOPIN) 0.5 MG tablet Take 0.5 mg by mouth 3 (three) times daily.      gabapentin (NEURONTIN) 300 MG capsule Take 300 mg by mouth 3 (three) times daily.       lamoTRIgine (LAMICTAL) 100 MG tablet Take 1 tablet (100 mg total) by mouth daily. Qty: 30 tablet, Refills: 2    traMADol (ULTRAM) 50 MG tablet Take 50 mg by mouth 2 (two) times daily. Maximum dose= 8 tablets per day     traZODone (DESYREL) 100 MG tablet Take 200 mg by mouth at bedtime.       STOP taking these medications     clindamycin (CLEOCIN) 150 MG capsule      HYDROcodone-acetaminophen (NORCO) 5-325 MG per tablet          BRIEF HPI:  See H&P, Labs, Consult and Test reports for all details in brief, patient was admitted for failed outpatient theapy for cellulitis.  CONSULTATIONS:  None  PERTINENT RADIOLOGIC STUDIES: Dg Chest 2 View  04/16/2011  *RADIOLOGY REPORT*  Clinical Data: Shortness of breath; left lower leg swelling and rash.  CHEST - 2 VIEW  Comparison: Chest radiograph performed 09/02/2010  Findings: The lungs are well-aerated.  Mild vascular congestion is noted, without significant pulmonary edema.  There is no evidence of focal opacification, pleural effusion or pneumothorax.  The heart is borderline normal in size; the mediastinal contour is within normal limits.  No acute osseous abnormalities are seen.  IMPRESSION: Mild vascular congestion noted; lungs remain grossly clear.  Original  Report Authenticated By: Tonia Ghent, M.D.     PERTINENT LAB RESULTS: CBC: No results found for this basename: WBC:2,HGB:2,HCT:2,PLT:2 in the last 72 hours CMET CMP     Component Value Date/Time   NA 141 04/23/2011 0630   K 4.5 04/23/2011 0630   CL 102 04/23/2011 0630   CO2 32 04/23/2011 0630   GLUCOSE 95 04/23/2011 0630   BUN 10 04/23/2011 0630   CREATININE 0.73 04/23/2011 0630   CREATININE 0.67 04/13/2011 0958   CALCIUM 9.4 04/23/2011 0630   PROT 6.2 04/13/2011 0958   ALBUMIN 4.0 04/13/2011 0958   AST 22 04/13/2011 0958   ALT 21 04/13/2011 0958   ALKPHOS 89 04/13/2011 0958   BILITOT 0.3 04/13/2011 0958   GFRNONAA >90 04/23/2011 0630   GFRAA >90 04/23/2011  0630    GFR Estimated Creatinine Clearance: 120.8 ml/min (by C-G formula based on Cr of 0.73). No results found for this basename: LIPASE:2,AMYLASE:2 in the last 72 hours No results found for this basename: CKTOTAL:3,CKMB:3,CKMBINDEX:3,TROPONINI:3 in the last 72 hours No results found for this basename: POCBNP:3 in the last 72 hours No results found for this basename: DDIMER:2 in the last 72 hours No results found for this basename: HGBA1C:2 in the last 72 hours No results found for this basename: CHOL:2,HDL:2,LDLCALC:2,TRIG:2,CHOLHDL:2,LDLDIRECT:2 in the last 72 hours No results found for this basename: TSH,T4TOTAL,FREET3,T3FREE,THYROIDAB in the last 72 hours No results found for this basename: VITAMINB12:2,FOLATE:2,FERRITIN:2,TIBC:2,IRON:2,RETICCTPCT:2 in the last 72 hours Coags: No results found for this basename: PT:2,INR:2 in the last 72 hours Microbiology: No results found for this or any previous visit (from the past 240 hour(s)).   BRIEF HOSPITAL COURSE:  1. Left lower extremity cellulitis: Patient was started on IV Vancomycin. On day 3, erythema and tenderness have improved and was planned to discharge on Doxycycline and Amoxicillin.  2. Chronic venous stasis dermatitis: She was started on Triamcinolone cream and was discharged on the same.  3. Chronic bilateral LE swelling: US Dopplers were negative for DVT. Underwent 2D echo to evaluate diastolic dysfunction. Due to patients body habitus, LV wall motion was not properly evaluated and she was recommended outpatient evaluation. She was discharged on oral lasix.  4. Hypertention: Controlled.  5. Bipolar disorder: Stable.    TODAY-DAY OF DISCHARGE:  Subjective:   Madison Wells today has no headache,no chest abdominal pain,no new weakness tingling or numbness, feels much better wants to go home today.   Objective:   Blood pressure 102/60, pulse 70, temperature 97.8 F (36.6 C), temperature source Oral, resp. rate  18, height 5\' 6"  (1.676 m), weight 151.9 kg (334 lb 14.1 oz), SpO2 98.00%.  Intake/Output Summary (Last 24 hours) at 04/24/11 1030 Last data filed at 04/24/11 0631  Gross per 24 hour  Intake    460 ml  Output   3250 ml  Net  -2790 ml    Exam Awake Alert, Oriented *3, No new F.N deficits, Normal affect [place].AT,PERRAL Supple Neck,No JVD, No cervical lymphadenopathy appriciated.  Symmetrical Chest wall movement, Good air movement bilaterally, CTAB RRR,No Gallops,Rubs or new Murmurs, No Parasternal Heave +ve B.Sounds, Abd Soft, Non tender, No organomegaly appriciated, No rebound -guarding or rigidity. No Cyanosis, Clubbing. No new Rash or bruise Bilateral LE edema, slightly improved from admission.  DISPOSITION: HOME  DISCHARGE INSTRUCTIONS:    Follow-up Information    Follow up with LYALL,MONICA, NP in 2 weeks.   Contact information:   [address] [place] [phone]  Total Time spent on discharge equals 45 minutes.  Signed: [name] Bolla 04/24/2011 10:30 AM

## 2011-04-29 ENCOUNTER — Ambulatory Visit (INDEPENDENT_AMBULATORY_CARE_PROVIDER_SITE_OTHER): Payer: Medicaid Other | Admitting: Psychology

## 2011-04-29 DIAGNOSIS — F319 Bipolar disorder, unspecified: Secondary | ICD-10-CM

## 2011-04-29 NOTE — Progress Notes (Signed)
   THERAPIST PROGRESS NOTE  Session Time: 0830 - 0930   Participation Level: Active  Behavioral Response: Well GroomedAlertDepressed  Type of Therapy: Individual Therapy  Treatment Goals addressed: Anger and Coping  Interventions: Solution Focused, Supportive and Reframing  Summary: Madison Wells is a 55 y.o. female who presents with report of recent inpatient at [facility] for edema of lower extremities and cellulitis without specific cause identified.  Still taking antibiotics and notices severe drowsiness every evening (6-8 pm) when she finds it hard to be alert and yet has responsibility for her two grandchildren (71 1/2 yrs and 57 month old, both girls).  Her youngest daughter moved out to stay with boyfriend yesterday (turned 56), so there is less back up support at home. Her second daughter has found work in [place] and stays there during the week, coming home and caring for her baby on the weekends. The father of that baby, Dalia is angry with because he is irresponsible about a job he would have had, but does not deny him access to see baby.  Daughter # 1 is working and going to school and takes over her child when she is at home.  And her 14 yr. Old boy is a challenge due to his age and history of Bipolar, ODD, and ADHD.  While she was in the hospital, her husband did come home and help out.  Looking at all the stress in the family, her comment was that she doesn't know what she will do when they all move out and "nobody needs me anymore".  Teared up at this thought, and expressed some of her predicted hopelessness.  However, I reframed that to the here and now, the positive of helping her children become self-sufficient and responsible and suggested that the needs may change, but that I doubt she will find herself not needed at all.  We reviewed her current BDI status which came out to be 34/45 down from 37/45 in Aug.  We reviewed her medication and it seems she has not deleted  the Klonopin.  However, it may be the antibiotics that are contributing to the drowsiness.  We looked at the coping skills she is using now; being assertive to ask her daughter for 30 min rest when most tired; the fact that her children are worried about her enough to tell her they will be making meal for Christmas; making sure the baby will be safe even if she gets drowsy by having her in the middle to the bed; taking naps when the children nap, doing minimal housework for now.  She left in a more positive frame of mind and with return appointment already made. .   Suicidal/Homicidal: Nowithout intent/plan  She contributes her safety to the fact that she is vital to her family at this time, as she is the primary caregiver for two young grandchildren.  Therapist Response: Acknowledged her fear of not being needed in the future and the protective factor that being needed plays for her.  Plan: Return again in 4 weeks.  Diagnosis: Axis I: Major Depression, Recurrent severe    Axis II: Deferred    Rashad Auld, RN 04/29/2011

## 2011-04-30 ENCOUNTER — Other Ambulatory Visit: Payer: Self-pay | Admitting: Physical Medicine and Rehabilitation

## 2011-04-30 DIAGNOSIS — M79609 Pain in unspecified limb: Secondary | ICD-10-CM

## 2011-04-30 DIAGNOSIS — M545 Low back pain: Secondary | ICD-10-CM

## 2011-04-30 DIAGNOSIS — G894 Chronic pain syndrome: Secondary | ICD-10-CM

## 2011-05-06 ENCOUNTER — Other Ambulatory Visit (HOSPITAL_COMMUNITY): Payer: Medicaid Other

## 2011-05-07 ENCOUNTER — Ambulatory Visit (HOSPITAL_COMMUNITY)
Admission: RE | Admit: 2011-05-07 | Discharge: 2011-05-07 | Disposition: A | Discharge status: Home / Self Care | Payer: Medicaid Other | Source: Ambulatory Visit | Attending: Physical Medicine and Rehabilitation | Admitting: Physical Medicine and Rehabilitation

## 2011-05-07 DIAGNOSIS — G894 Chronic pain syndrome: Secondary | ICD-10-CM

## 2011-05-07 DIAGNOSIS — M79609 Pain in unspecified limb: Secondary | ICD-10-CM

## 2011-05-07 DIAGNOSIS — M545 Low back pain, unspecified: Secondary | ICD-10-CM

## 2011-05-07 DIAGNOSIS — M47817 Spondylosis without myelopathy or radiculopathy, lumbosacral region: Secondary | ICD-10-CM | POA: Insufficient documentation

## 2011-05-10 ENCOUNTER — Encounter
Payer: Medicaid Other | Attending: Physical Medicine and Rehabilitation | Admitting: Physical Medicine and Rehabilitation

## 2011-05-10 DIAGNOSIS — G894 Chronic pain syndrome: Secondary | ICD-10-CM

## 2011-05-10 DIAGNOSIS — G5600 Carpal tunnel syndrome, unspecified upper limb: Secondary | ICD-10-CM | POA: Insufficient documentation

## 2011-05-10 DIAGNOSIS — R209 Unspecified disturbances of skin sensation: Secondary | ICD-10-CM

## 2011-05-10 DIAGNOSIS — M545 Low back pain, unspecified: Secondary | ICD-10-CM

## 2011-05-10 DIAGNOSIS — M79609 Pain in unspecified limb: Secondary | ICD-10-CM | POA: Insufficient documentation

## 2011-05-10 DIAGNOSIS — G8929 Other chronic pain: Secondary | ICD-10-CM | POA: Insufficient documentation

## 2011-05-10 DIAGNOSIS — M129 Arthropathy, unspecified: Secondary | ICD-10-CM | POA: Insufficient documentation

## 2011-05-11 NOTE — Assessment & Plan Note (Signed)
[name] is a pleasant 55 year old woman who is followed here at [facility] for multiple chronic pain complaints including some parascapular pain, low back pain and bilateral lower extremity pain.  She tells me, she is currently still taking antibiotics.  She was hospitalized for several days last week for lower extremity cellulitis.  Her low back continues to bother her.  It is about 8 on a scale of 10, interfering at times with sleep and her activity levels.  Pain is worse with standing and bending; improves with rest, heat or ice and medication.  She gets fair relief with current meds.  She has limited functional status.  She has limited walking capacity not more than 5 minutes.  Currently, she is able to climb stairs.  She is driving.  She is independent with self-care, needs assistance with heavier household tasks.  She is on disability.  REVIEW OF SYSTEMS:  She denies problems with bowel or bladder.  Admits to depression, anxiety, trouble walking, numbness, tingling, weakness. Denies suicidal ideation.  Also, review of systems positive for weight gain, blood sugar changes, poor appetite, limb swelling, shortness of breath, sleep apnea.  I asked her to follow up with primary care for these other medical problemsand complaints.  PAST MEDICAL, SOCIAL AND FAMILY HISTORY:  Otherwise unchanged, other than not already noted.  PHYSICAL EXAMINATION:  On exam, her blood pressure is 138/81, pulse 67, respirations 16, 94% saturated on room air.  She is 321 pounds and 5 feet, 5 inches tall, obese woman who does not appear in any distress. She does appear bit more tired today.  She has answers my questions appropriately.  She is cooperative and pleasant.  Follows commands without difficulty.  Cranial nerves and coordination are intact.  Reflexes are diminished in the lower extremities, 1+ in the upper extremities, 1+ in the  lower extremities.  No abnormal tone, clonus or tremors are noted.  Her motor strength is overall good in both upper and lower extremities without focal deficit.  She reports intermittent numbness in the left hand along the 5th digit intermittently at times.  She reports no numbness in the other upper extremity.  She transitions from sitting to standing without difficulty.  Her gait is somewhat slow slightly antalgic.  She has limitations in lumbar motion in all planes.  IMPRESSION: 1. Mild-to-moderate bilateral carpal tunnel syndrome. 2. Mild ulnar involvement on the left 3. Bilateral lower extremity pain worse on the left than on the right,     status post recent MRI.  I reviewed the results with her.  The     disks from T10-12 to L5-S1 are relatively normal.  She has no     stenosis foraminal or central noted.  She does have some facet     arthropathy at L4-5 and L5-S1 bilaterally.  I reviewed this with     her.  PLAN:  We discussed various treatment options for her today.  She is still on antibiotics, status post the lower extremity cellulitis infection.  We will hold off on any kind of injection at this time.  She has a score above 8 on the opioid risk tool and narcotic management is limited.  She is currently on tramadol.  She is taking it twice a day at 9 a.m. and 4 p.m.  She is using 300 mg gabapentin each day.  She finds these medications helpful.  She is not having any problems with oversedation or constipation.  She is comfortable with management at this time.  Next month, we will re-evaluate her possibly for lower extremity facet injection medial branch block.  We have discussed the importance of physical therapy.  However, she apparently has already used up 4 visits and would have to self pay for the rest and she does not have financial means to do this currently.  We discussed treatment options for her left upper extremity.  I asked her to keep pressure off of  the elbow region while she is sitting.  We will see her back in a month.  I have answered all her questions. She is comfortable with our plan.     Brantley Stage, M.D. Electronically Signed    DMK/MedQ D:  05/10/2011 10:58:50  T:  05/11/2011 02:22:15  Job #:  [id number]

## 2011-05-13 ENCOUNTER — Encounter (HOSPITAL_COMMUNITY): Payer: Medicaid Other | Admitting: Psychology

## 2011-05-27 ENCOUNTER — Ambulatory Visit (HOSPITAL_COMMUNITY): Payer: Medicaid Other | Admitting: Psychology

## 2011-06-07 ENCOUNTER — Encounter: Payer: Medicaid Other | Admitting: Physical Medicine and Rehabilitation

## 2011-06-08 ENCOUNTER — Ambulatory Visit (INDEPENDENT_AMBULATORY_CARE_PROVIDER_SITE_OTHER): Payer: Medicaid Other | Admitting: Psychology

## 2011-06-08 DIAGNOSIS — F3160 Bipolar disorder, current episode mixed, unspecified: Secondary | ICD-10-CM
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
## 2011-06-16 ENCOUNTER — Encounter (HOSPITAL_COMMUNITY): Payer: Self-pay | Admitting: Psychiatry

## 2011-06-16 ENCOUNTER — Ambulatory Visit (INDEPENDENT_AMBULATORY_CARE_PROVIDER_SITE_OTHER): Payer: Medicaid Other | Admitting: Psychiatry

## 2011-06-16 VITALS — BP 144/102 | HR 70 | Wt 319.0 lb

## 2011-06-16 DIAGNOSIS — F319 Bipolar disorder, unspecified: Secondary | ICD-10-CM

## 2011-06-16 MED ORDER — ARIPIPRAZOLE 5 MG PO TABS: 5.0000 mg | ORAL_TABLET | Freq: Every day | ORAL | Status: DC

## 2011-06-16 MED ORDER — TRAZODONE HCL 100 MG PO TABS: ORAL_TABLET | ORAL | Status: DC

## 2011-06-16 MED ORDER — LAMOTRIGINE 100 MG PO TABS: 100.0000 mg | ORAL_TABLET | Freq: Every day | ORAL | Status: DC

## 2011-06-16 MED ORDER — CITALOPRAM HYDROBROMIDE 40 MG PO TABS: 40.0000 mg | ORAL_TABLET | Freq: Every day | ORAL | Status: DC
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
## 2011-06-17 ENCOUNTER — Ambulatory Visit (HOSPITAL_COMMUNITY): Payer: Medicaid Other | Admitting: Psychology

## 2011-06-22 ENCOUNTER — Ambulatory Visit (HOSPITAL_COMMUNITY): Payer: Medicaid Other | Admitting: Physician Assistant

## 2011-06-24 ENCOUNTER — Ambulatory Visit (INDEPENDENT_AMBULATORY_CARE_PROVIDER_SITE_OTHER): Payer: Medicaid Other | Admitting: Psychology

## 2011-06-24 DIAGNOSIS — F330 Major depressive disorder, recurrent, mild: Secondary | ICD-10-CM
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
## 2011-07-02 ENCOUNTER — Encounter
Payer: Medicaid Other | Attending: Physical Medicine and Rehabilitation | Admitting: Physical Medicine and Rehabilitation

## 2011-07-02 DIAGNOSIS — M545 Low back pain, unspecified: Secondary | ICD-10-CM

## 2011-07-02 DIAGNOSIS — M25569 Pain in unspecified knee: Secondary | ICD-10-CM | POA: Insufficient documentation

## 2011-07-02 DIAGNOSIS — G5600 Carpal tunnel syndrome, unspecified upper limb: Secondary | ICD-10-CM | POA: Insufficient documentation

## 2011-07-02 DIAGNOSIS — R209 Unspecified disturbances of skin sensation: Secondary | ICD-10-CM | POA: Insufficient documentation

## 2011-07-02 DIAGNOSIS — M1710 Unilateral primary osteoarthritis, unspecified knee: Secondary | ICD-10-CM

## 2011-07-02 DIAGNOSIS — M549 Dorsalgia, unspecified: Secondary | ICD-10-CM | POA: Insufficient documentation

## 2011-07-02 DIAGNOSIS — R269 Unspecified abnormalities of gait and mobility: Secondary | ICD-10-CM | POA: Insufficient documentation

## 2011-07-02 DIAGNOSIS — G894 Chronic pain syndrome: Secondary | ICD-10-CM

## 2011-07-02 DIAGNOSIS — Z79899 Other long term (current) drug therapy: Secondary | ICD-10-CM | POA: Insufficient documentation

## 2011-07-03 NOTE — Assessment & Plan Note (Signed)
[name] is a pleasant 56 year old woman who is followed at our [facility] for multiple pain complaints including parascapular pain, bilateral hand numbness, and pain worse on the left than on the right side, lumbago, and bilateral knee pain.  She has average pain scores of about a 5 or 6 with medication and about a 7 without medication.  She reports pain is worse with activities such as prolonged standing, using her left upper extremity.  Pain is worse in the morning toward the end of the day. Sleep is fair.  She reports fair relief with current medications.  She takes tramadol 50 mg twice a day as well as gabapentin 300 mg 3 times a day.  Her chief complaint today is left hand numbness, tingling and intermittent pain.  She has been wearing a night splint at night. Recent electrodiagnostic study showed moderate carpal tunnel on this side.  She has been using night splints for about 6 weeks now and she reports very little improvement in the numbness.  FUNCTIONAL STATUS:  She is walking 15 minutes now.  She has increased over the last several weeks.  She reports a 2-pound weight loss.  She is independent with feeding, dressing, bathing, and toileting and helps take care of her household.  She is currently not employed.  REVIEW OF SYSTEMS:  Numbness, tingling, trouble walking, depression, anxiety.  Denies suicidal ideation.  Denies problems with bowel or bladder.  No change in past medical, social, or family history.  I have counseled her regarding stopping smoking again.  PHYSICAL EXAMINATION:  Her blood pressure is 109/58, pulse 74, respiration 18, 94% saturated on room air.  She is 319 pounds and 65 inches tall.  She is oriented x3.  Speech is clear.  Affect is bright. She is alert, cooperative, and pleasant.  Follows commands without difficulty.  Answers my questions appropriately.  Cranial nerves and coordination are intact.  Her  reflexes are 1+, bilateral upper and lower extremities without abnormal tone, clonus, or tremors.  Motor strength is good in both upper and both lower extremities.  She has some numbness in the median distribution on the left, intact on the right.  No focal weakness is noted in some abduction or intrinsics.  Motor strength in the lower extremities is good without focal deficit. No straight leg raise is negative. She transitions easily from sitting to standing.  Her gait is not antalgic.  Tandem gait and Romberg test are performed adequately.  She is able to flex forward with some limitation.  She does complain of some low back pain with forward flexion.  Extension does not bother her quite as much today.  IMPRESSION: 1. Left hand pain with diagnosis of moderate carpal tunnel syndrome     per electrodiagnostic studies done within the last 6 weeks.  The     patient has been using hand splint without much improvement. 2. Low back pain, which is consistent with facet and degenerative disk     disease.  I have asked the patient to maintain her current activity     level.  I would like to see her walking on a daily basis.  Avoid     prolonged sitting or standing.  I would like her to lose weight and     stop smoking. 3. Bilateral knee pain.  The patient continues to use a cane for     ambulation.  PLAN:  We will have the patient set up for  carpal tunnel wrist injection.  We will use ultrasound as well.  I have rewritten prescriptions for her today, tramadol 50 mg 1 p.o. b.i.d. #60 with a refill as well as gabapentin 300 mg 1 p.o. t.i.d. #90 with a refill as well.  I have answered all her questions.  She is comfortable with this plan currently.     Brantley Stage, M.D.    DMK/MedQ D:  07/02/2011 09:12:46  T:  07/03/2011 02:36:33  Job #:  [id number]

## 2011-07-06 ENCOUNTER — Other Ambulatory Visit (HOSPITAL_COMMUNITY): Payer: Self-pay | Admitting: Physician Assistant

## 2011-07-07 ENCOUNTER — Other Ambulatory Visit (HOSPITAL_COMMUNITY): Payer: Self-pay | Admitting: Psychiatry

## 2011-07-13 ENCOUNTER — Ambulatory Visit (HOSPITAL_COMMUNITY): Payer: Medicaid Other | Admitting: Psychology

## 2011-07-13 ENCOUNTER — Telehealth (HOSPITAL_COMMUNITY): Payer: Self-pay | Admitting: Psychology

## 2011-07-13 NOTE — Telephone Encounter (Signed)
See notes of phone call

## 2011-07-23 ENCOUNTER — Encounter (HOSPITAL_COMMUNITY): Payer: Self-pay | Admitting: Psychiatry

## 2011-07-23 ENCOUNTER — Ambulatory Visit (INDEPENDENT_AMBULATORY_CARE_PROVIDER_SITE_OTHER): Payer: Medicaid Other | Admitting: Psychiatry

## 2011-07-23 VITALS — BP 110/70 | Wt 318.0 lb

## 2011-07-23 DIAGNOSIS — F319 Bipolar disorder, unspecified: Secondary | ICD-10-CM

## 2011-07-23 MED ORDER — BUPROPION HCL ER (SR) 150 MG PO TB12: 150.0000 mg | ORAL_TABLET | Freq: Every day | ORAL | Status: DC

## 2011-07-23 MED ORDER — TRAZODONE HCL 100 MG PO TABS: 100.0000 mg | ORAL_TABLET | Freq: Every day | ORAL | Status: DC

## 2011-07-23 MED ORDER — DULOXETINE HCL 30 MG PO CPEP: 30.0000 mg | ORAL_CAPSULE | Freq: Every day | ORAL | Status: DC

## 2011-07-23 MED ORDER — ARIPIPRAZOLE 5 MG PO TABS: ORAL_TABLET | ORAL | Status: DC
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
## 2011-07-29 ENCOUNTER — Ambulatory Visit (INDEPENDENT_AMBULATORY_CARE_PROVIDER_SITE_OTHER): Payer: Medicaid Other | Admitting: Psychology

## 2011-07-29 DIAGNOSIS — F3132 Bipolar disorder, current episode depressed, moderate: Secondary | ICD-10-CM
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
## 2011-07-30 ENCOUNTER — Other Ambulatory Visit (HOSPITAL_COMMUNITY): Payer: Self-pay | Admitting: Psychiatry

## 2011-07-30 ENCOUNTER — Telehealth (HOSPITAL_COMMUNITY): Payer: Self-pay | Admitting: Psychology

## 2011-07-30 NOTE — Telephone Encounter (Signed)
See phone message

## 2011-07-31 ENCOUNTER — Emergency Department (HOSPITAL_COMMUNITY): Payer: Medicaid Other

## 2011-07-31 ENCOUNTER — Emergency Department (HOSPITAL_COMMUNITY)
Admission: EM | Admit: 2011-07-31 | Discharge: 2011-07-31 | Disposition: A | Discharge status: Home / Self Care | Payer: Medicaid Other | Source: Home / Self Care | Attending: Emergency Medicine | Admitting: Emergency Medicine

## 2011-07-31 ENCOUNTER — Encounter (HOSPITAL_COMMUNITY): Payer: Self-pay | Admitting: Emergency Medicine

## 2011-07-31 ENCOUNTER — Emergency Department (INDEPENDENT_AMBULATORY_CARE_PROVIDER_SITE_OTHER): Payer: Medicaid Other

## 2011-07-31 DIAGNOSIS — M1711 Unilateral primary osteoarthritis, right knee: Secondary | ICD-10-CM

## 2011-07-31 DIAGNOSIS — J40 Bronchitis, not specified as acute or chronic: Secondary | ICD-10-CM

## 2011-07-31 DIAGNOSIS — IMO0002 Reserved for concepts with insufficient information to code with codable children: Secondary | ICD-10-CM

## 2011-07-31 DIAGNOSIS — M1710 Unilateral primary osteoarthritis, unspecified knee: Secondary | ICD-10-CM

## 2011-07-31 MED ORDER — ALBUTEROL SULFATE HFA 108 (90 BASE) MCG/ACT IN AERS: 1.0000 | INHALATION_SPRAY | Freq: Four times a day (QID) | RESPIRATORY_TRACT | Status: AC | PRN

## 2011-07-31 MED ORDER — AMOXICILLIN 500 MG PO CAPS: 1000.0000 mg | ORAL_CAPSULE | Freq: Three times a day (TID) | ORAL | Status: AC

## 2011-07-31 MED ORDER — BENZONATATE 200 MG PO CAPS: 200.0000 mg | ORAL_CAPSULE | Freq: Three times a day (TID) | ORAL | Status: AC | PRN

## 2011-07-31 MED ORDER — ACETAMINOPHEN-CODEINE #3 300-30 MG PO TABS: 1.0000 | ORAL_TABLET | ORAL | Status: AC | PRN

## 2011-07-31 MED ORDER — MELOXICAM 7.5 MG PO TABS: 7.5000 mg | ORAL_TABLET | Freq: Every day | ORAL | Status: DC

## 2011-07-31 NOTE — ED Provider Notes (Signed)
Chief Complaint  Patient presents with  . Recurrent Pneumonia  . Leg Pain    History of Present Illness:   Madison Wells is a 56 year old female who is in today because of a cough and right knee pain and  She presents with a four-week history of a loose, rattly cough, productive of small amounts of yellow-green sputum. She denies hemoptysis. She's also had wheezing, and shortness of breath. She gets short of breath going up steps, doing housework, and walking on level ground about a block. She has not had any shortness of breath at rest or lying down at night. She has not had fever, chills, or sweats. She denies any chest pain, tightness, pressure, or palpitations. No PND, orthopnea, or ankle edema. She has no known history of lung disease such as COPD or asthma. No history of cardiac disease or congestive heart failure. She does have high blood pressure and smokes one half to one pack of cigarettes per day.  She also has a four-week history of right knee pain. This is localized over the entire knee, especially over the lateral aspect of the knee with radiation up into the lateral thigh. The pain is worse if she walks or goes up steps. It is better if she goes down steps, sits down or lies down. It's not any worse with standing. She tried Advil without relief. She has a history of obesity and arthritis in her spine.  Review of Systems:  Other than noted above, the patient denies any of the following symptoms. Systemic:  No fever, chills, sweats, fatigue, myalgias, headache, or anorexia. Eye:  No redness, pain or drainage. ENT:  No earache, nasal congestion, rhinorrhea, sinus pressure, or sore throat. Lungs:  No cough, sputum production, wheezing, shortness of breath. Or chest pain. GI:  No nausea, vomiting, abdominal pain or diarrhea. Skin:  No rash or itching.  PMFSH:  Past medical history, family history, social history, meds, and allergies were reviewed.  Physical Exam:   Vital signs:  BP  142/80  Pulse 76  Temp(Src) 98 F (36.7 C) (Oral)  Resp 20  SpO2 97% General:  Alert, in no distress. Eye:  No conjunctival injection or drainage. ENT:  TMs and canals were normal, without erythema or inflammation.  Nasal mucosa was clear and uncongested, without drainage.  Mucous membranes were moist.  Pharynx was clear, without exudate or drainage.  There were no oral ulcerations or lesions. Neck:  Supple, no adenopathy, tenderness or mass. No JVD. Lungs:  No respiratory distress.  Lungs were clear to auscultation, without wheezes, rales or rhonchi.  Breath sounds were clear and equal bilaterally. Heart:  Regular rhythm, without gallops, murmers or rubs. Extremities: No ankle edema. Exam of the knee reveals generalized pain to palpation. There is no swelling or joint effusion. The knee had a full range of motion without crepitus but with pain on flexion. Skin:  Clear, warm, and dry, without rash or lesions.   Radiology:  Dg Chest 2 View  07/31/2011  *RADIOLOGY REPORT*  Clinical Data: Cough for 4 weeks.  CHEST - 2 VIEW  Comparison: 04/16/2011  Findings: There are scattered linear densities in both lower lungs. Upper lungs are clear.  Heart size is upper limits of normal but stable.  Trachea is midline.  No evidence of pleural effusions.  IMPRESSION: Linear densities in both lower lungs.  Findings could represent areas of atelectasis.  There is no focal airspace disease.  Original Report Authenticated By: Richarda Overlie, M.D.  Dg Knee 1-2 Views Right  07/31/2011  *RADIOLOGY REPORT*  Clinical Data: Right knee pain for 4 weeks.  No injury.  RIGHT KNEE - 1-2 VIEW  Comparison: 09/13/2009  Findings: Two views of the right knee were obtained.  There is medial joint space narrowing with osteophyte formation.  Difficult to exclude a suprapatellar joint effusion.  Negative for acute fracture or dislocation.  A small spur along the superior aspect of the patella.  IMPRESSION: Mild osteoarthritic changes in the  right knee.  No acute bony abnormality.  Difficult to exclude a suprapatellar joint effusion.  Original Report Authenticated By: Richarda Overlie, M.D.    Assessment:   Diagnoses that have been ruled out:  None  Diagnoses that are still under consideration:  None  Final diagnoses:  Bronchitis  Osteoarthritis of right knee      Plan:   1.  The following meds were prescribed:   New Prescriptions   ACETAMINOPHEN-CODEINE (TYLENOL #3) 300-30 MG PER TABLET    Take 1-2 tablets by mouth every 4 (four) hours as needed for pain.   ALBUTEROL (PROVENTIL HFA;VENTOLIN HFA) 108 (90 BASE) MCG/ACT INHALER    Inhale 1-2 puffs into the lungs every 6 (six) hours as needed for wheezing.   AMOXICILLIN (AMOXIL) 500 MG CAPSULE    Take 2 capsules (1,000 mg total) by mouth 3 (three) times daily.   BENZONATATE (TESSALON) 200 MG CAPSULE    Take 1 capsule (200 mg total) by mouth 3 (three) times daily as needed for cough.   MELOXICAM (MOBIC) 7.5 MG TABLET    Take 1 tablet (7.5 mg total) by mouth daily.   2.  The patient was instructed in symptomatic care and handouts were given. 3.  The patient was told to return if becoming worse in any way, if no better in 3 or 4 days, and given some red flag symptoms that would indicate earlier return. 4.  Discussed smoking cessation with patient, but it appears that she's not ready to give it up right now.  Follow up:  The patient was told to follow up with her primary care physician in one week.    Reuben Likes, MD 07/31/11 1328

## 2011-07-31 NOTE — ED Notes (Signed)
Pt c/o suspected bronchitis with onset 4 weeks ago of cough with greenish/yellow sputum, wheezing, and dyspnea on exertion.  Pt denies chest pain.  Pt also c/o 10/10 pain in right knee that is stabbing and sharp when she bends her knee.  Pt stated she hasn't fallen or injured knee.  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

## 2011-07-31 NOTE — Discharge Instructions (Signed)
Bronchitis Bronchitis is the body's way of reacting to injury and/or infection (inflammation) of the bronchi. Bronchi are the air tubes that extend from the windpipe into the lungs. If the inflammation becomes severe, it may cause shortness of breath. CAUSES  Inflammation may be caused by:  A virus.   Germs (bacteria).   Dust.   Allergens.   Pollutants and many other irritants.  The cells lining the bronchial tree are covered with tiny hairs (cilia). These constantly beat upward, away from the lungs, toward the mouth. This keeps the lungs free of pollutants. When these cells become too irritated and are unable to do their job, mucus begins to develop. This causes the characteristic cough of bronchitis. The cough clears the lungs when the cilia are unable to do their job. Without either of these protective mechanisms, the mucus would settle in the lungs. Then you would develop pneumonia. Smoking is a common cause of bronchitis and can contribute to pneumonia. Stopping this habit is the single most important thing you can do to help yourself. TREATMENT   Your caregiver may prescribe an antibiotic if the cough is caused by bacteria. Also, medicines that open up your airways make it easier to breathe. Your caregiver may also recommend or prescribe an expectorant. It will loosen the mucus to be coughed up. Only take over-the-counter or prescription medicines for pain, discomfort, or fever as directed by your caregiver.   Removing whatever causes the problem (smoking, for example) is critical to preventing the problem from getting worse.   Cough suppressants may be prescribed for relief of cough symptoms.   Inhaled medicines may be prescribed to help with symptoms now and to help prevent problems from returning.   For those with recurrent (chronic) bronchitis, there may be a need for steroid medicines.  SEEK IMMEDIATE MEDICAL CARE IF:   During treatment, you develop more pus-like mucus  (purulent sputum).   You have a fever.   Your baby is older than 3 months with a rectal temperature of 102 F (38.9 C) or higher.   Your baby is 30 months old or younger with a rectal temperature of 100.4 F (38 C) or higher.   You become progressively more ill.   You have increased difficulty breathing, wheezing, or shortness of breath.  It is necessary to seek immediate medical care if you are elderly or sick from any other disease. MAKE SURE YOU:   Understand these instructions.   Will watch your condition.   Will get help right away if you are not doing well or get worse.  Document Released: 05/10/2005 Document Revised: 04/29/2011 Document Reviewed: 03/19/2008 [facility] Patient Information 2012 [place], [place].Arthritis, Nonspecific Arthritis is inflammation of a joint. This usually means pain, redness, warmth or swelling are present. One or more joints may be involved. There are a number of types of arthritis. Your caregiver may not be able to tell what type of arthritis you have right away. CAUSES  The most common cause of arthritis is the wear and tear on the joint (osteoarthritis). This causes damage to the cartilage, which can break down over time. The knees, hips, back and neck are most often affected by this type of arthritis. Other types of arthritis and common causes of joint pain include:  Sprains and other injuries near the joint. Sometimes minor sprains and injuries cause pain and swelling that develop hours later.   Rheumatoid arthritis. This affects hands, feet and knees. It usually affects both sides of your body  at the same time. It is often associated with chronic ailments, fever, weight loss and general weakness.   Crystal arthritis. Gout and pseudo gout can cause occasional acute severe pain, redness and swelling in the foot, ankle, or knee.   Infectious arthritis. Bacteria can get into a joint through a break in overlying skin. This can cause infection of the  joint. Bacteria and viruses can also spread through the blood and affect your joints.   Drug, infectious and allergy reactions. Sometimes joints can become mildly painful and slightly swollen with these types of illnesses.  SYMPTOMS   Pain is the main symptom.   Your joint or joints can also be red, swollen and warm or hot to the touch.   You may have a fever with certain types of arthritis, or even feel overall ill.   The joint with arthritis will hurt with movement. Stiffness is present with some types of arthritis.  DIAGNOSIS  Your caregiver will suspect arthritis based on your description of your symptoms and on your exam. Testing may be needed to find the type of arthritis:  Blood and sometimes urine tests.   X-ray tests and sometimes CT or MRI scans.   Removal of fluid from the joint (arthrocentesis) is done to check for bacteria, crystals or other causes. Your caregiver (or a specialist) will numb the area over the joint with a local anesthetic, and use a needle to remove joint fluid for examination. This procedure is only minimally uncomfortable.   Even with these tests, your caregiver may not be able to tell what kind of arthritis you have. Consultation with a specialist (rheumatologist) may be helpful.  TREATMENT  Your caregiver will discuss with you treatment specific to your type of arthritis. If the specific type cannot be determined, then the following general recommendations may apply. Treatment of severe joint pain includes:  Rest.   Elevation.   Anti-inflammatory medication (for example, ibuprofen) may be prescribed. Avoiding activities that cause increased pain.   Only take over-the-counter or prescription medicines for pain and discomfort as recommended by your caregiver.   Cold packs over an inflamed joint may be used for 10 to 15 minutes every hour. Hot packs sometimes feel better, but do not use overnight. Do not use hot packs if you are diabetic without your  caregiver's permission.   A cortisone shot into arthritic joints may help reduce pain and swelling.   Any acute arthritis that gets worse over the next 1 to 2 days needs to be looked at to be sure there is no joint infection.  Long-term arthritis treatment involves modifying activities and lifestyle to reduce joint stress jarring. This can include weight loss. Also, exercise is needed to nourish the joint cartilage and remove waste. This helps keep the muscles around the joint strong. HOME CARE INSTRUCTIONS   Do not take aspirin to relieve pain if gout is suspected. This elevates uric acid levels.   Only take over-the-counter or prescription medicines for pain, discomfort or fever as directed by your caregiver.   Rest the joint as much as possible.   If your joint is swollen, keep it elevated.   Use crutches if the painful joint is in your leg.   Drinking plenty of fluids may help for certain types of arthritis.   Follow your caregiver's dietary instructions.   Try low-impact exercise such as:   Swimming.   Water aerobics.   Biking.   Walking.   Morning stiffness is often relieved by  a warm shower.   Put your joints through regular range-of-motion.  SEEK MEDICAL CARE IF:   You do not feel better in 24 hours or are getting worse.   You have side effects to medications, or are not getting better with treatment.  SEEK IMMEDIATE MEDICAL CARE IF:   You have a fever.   You develop severe joint pain, swelling or redness.   Many joints are involved and become painful and swollen.   There is severe back pain and/or leg weakness.   You have loss of bowel or bladder control.  Document Released: 06/17/2004 Document Revised: 04/29/2011 Document Reviewed: 07/03/2008 [facility] Patient Information 2012 [place], [place].

## 2011-08-04 ENCOUNTER — Ambulatory Visit (INDEPENDENT_AMBULATORY_CARE_PROVIDER_SITE_OTHER): Payer: Medicaid Other | Admitting: Psychiatry

## 2011-08-04 ENCOUNTER — Encounter (HOSPITAL_COMMUNITY): Payer: Self-pay | Admitting: Psychiatry

## 2011-08-04 ENCOUNTER — Ambulatory Visit (HOSPITAL_COMMUNITY): Payer: Medicaid Other | Admitting: Psychiatry

## 2011-08-04 DIAGNOSIS — F319 Bipolar disorder, unspecified: Secondary | ICD-10-CM

## 2011-08-04 MED ORDER — LAMOTRIGINE 150 MG PO TABS: 150.0000 mg | ORAL_TABLET | Freq: Every day | ORAL | Status: DC

## 2011-08-04 NOTE — Progress Notes (Signed)
Chief complaint I'm doing better  History of present illness Patient is 56 year old Caucasian female who came for her followup appointment. On her last visit we have started her on Cymbalta and her Celexa was discontinued. Patient feels somewhat better with Cymbalta. Though she is still has depression and residual pain but they're less intense. She continues to have episodic agitation and mood swings. She is sleeping better. She is taking multiple psychiatric medication but denies any side effects. She denies any crying spells or feeling of hopelessness or helplessness. She has been seeing therapist regularly.  Current psychiatric medication Abilify 5 mg daily Cymbalta 30 mg daily Neurontin 300 mg 3 times a day Lamictal 100 mg daily Trazodone 100 mg daily Wellbutrin SR 150 mg daily  Mental status examination Patient is casually dressed and fairly groomed. She is pleasant and cooperative. She described her mood is anxious and her affect is constricted. She is easily tearful at times when she is talking about her pain. She denies any active or passive suicidal thinking and homicidal thinking. Her thought process is logical linear and goal-directed. She denies any paranoia or delusional thinking. She denies any auditory or visual hallucination. Her attention and concentration is fair. She's alert and oriented x3. Her insight judgment and impulse control is okay.  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 Depressive disorder with psychotic features Axis II deferred Axis III multiple medical problems Axis IV moderate Axis V 60-65  Plan Patient is tolerating her medication without any side effects however she continued to have episodic agitation anger. I recommend to try Lamictal 150 mg daily. I explained to closely monitor rash in that case she need to call us back immediately or to stop the medicine. She will continue all her other medications. She will continue therapy for  increase coping and social skills. I will see her again in 4-6 weeks. A new prescription of Lamictal 150 is given.

## 2011-08-09 ENCOUNTER — Other Ambulatory Visit: Payer: Self-pay | Admitting: *Deleted

## 2011-08-09 ENCOUNTER — Other Ambulatory Visit (HOSPITAL_COMMUNITY): Payer: Self-pay | Admitting: Physician Assistant

## 2011-08-09 MED ORDER — GABAPENTIN 300 MG PO CAPS: 300.0000 mg | ORAL_CAPSULE | Freq: Three times a day (TID) | ORAL | Status: DC

## 2011-08-12 ENCOUNTER — Other Ambulatory Visit (HOSPITAL_COMMUNITY): Payer: Self-pay | Admitting: Physician Assistant

## 2011-08-13 ENCOUNTER — Encounter
Payer: Medicaid Other | Attending: Physical Medicine and Rehabilitation | Admitting: Physical Medicine and Rehabilitation

## 2011-08-13 ENCOUNTER — Encounter: Payer: Self-pay | Admitting: Physical Medicine and Rehabilitation

## 2011-08-13 VITALS — BP 152/82 | HR 60 | Resp 18 | Ht 65.0 in | Wt 315.0 lb

## 2011-08-13 DIAGNOSIS — M129 Arthropathy, unspecified: Secondary | ICD-10-CM | POA: Insufficient documentation

## 2011-08-13 DIAGNOSIS — G5600 Carpal tunnel syndrome, unspecified upper limb: Secondary | ICD-10-CM

## 2011-08-13 DIAGNOSIS — G5602 Carpal tunnel syndrome, left upper limb: Secondary | ICD-10-CM

## 2011-08-13 DIAGNOSIS — G8929 Other chronic pain: Secondary | ICD-10-CM | POA: Insufficient documentation

## 2011-08-13 DIAGNOSIS — M79609 Pain in unspecified limb: Secondary | ICD-10-CM | POA: Insufficient documentation

## 2011-08-13 NOTE — Patient Instructions (Signed)
Continue to use your night splint. Rest Left hand over the next week. Follow up in one month.

## 2011-08-13 NOTE — Progress Notes (Signed)
  Carpal tunnel injection Static and real time images of the left wrist median nerve were obtained prior to injection. Area of the median nerve was measured. With flexion extension of the fingers it appeared there was some adherence of the median nerve.  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 Symptoms are not relieved by conservative care.  Informed consent was obtained after describing risks and benefits of the procedure with the patient. These include bleeding bruising and infection as well as nerve injury. Patient elected to proceed and has given written consent. The distal wrist crease was marked and prepped with Betadineand alcohol.  A 27-gauge 1/2 inch needle was inserted into the carpal tunnel and 0.25 mL of kenalog 40 mg per mL was injected. Patient tolerated procedure well. Post procedure instructions given.und was reviewed.

## 2011-08-16 ENCOUNTER — Encounter: Payer: Self-pay | Admitting: Physical Medicine and Rehabilitation

## 2011-08-18 ENCOUNTER — Other Ambulatory Visit (HOSPITAL_COMMUNITY): Payer: Self-pay | Admitting: Psychiatry

## 2011-08-25 ENCOUNTER — Other Ambulatory Visit (HOSPITAL_COMMUNITY): Payer: Self-pay | Admitting: Psychiatry

## 2011-08-25 DIAGNOSIS — F319 Bipolar disorder, unspecified: Secondary | ICD-10-CM

## 2011-08-26 ENCOUNTER — Ambulatory Visit (HOSPITAL_COMMUNITY): Payer: Self-pay | Admitting: Psychology

## 2011-08-28 ENCOUNTER — Other Ambulatory Visit (HOSPITAL_COMMUNITY): Payer: Self-pay | Admitting: Physician Assistant

## 2011-08-30 ENCOUNTER — Ambulatory Visit: Payer: Medicaid Other | Admitting: Physical Medicine and Rehabilitation

## 2011-08-31 ENCOUNTER — Ambulatory Visit (INDEPENDENT_AMBULATORY_CARE_PROVIDER_SITE_OTHER): Payer: Medicaid Other | Admitting: Psychology

## 2011-08-31 DIAGNOSIS — F3131 Bipolar disorder, current episode depressed, mild: Secondary | ICD-10-CM

## 2011-08-31 NOTE — Progress Notes (Signed)
   THERAPIST PROGRESS NOTE  Session Time: 0830 - 0915  Participation Level: Active  Behavioral Response: CasualAlertDysphoric  Type of Therapy: Individual Therapy  Treatment Goals addressed: Coping  Interventions: Psychosocial Skills: talking with teen, Supportive and Reframing  Summary: Madison Wells is a 56 y.o. female who presents with doing well overall, due in part to having a trip to [facility] next April to look forward to.  States she feels she is taking medications that are a good fit for her.  Continues to have aggravations related to her daughter, Madison Wells, who "is just like me", but 19 and not making good decisions.  Also, gets very frustrated with her son, Madison Wells, who is doing well, but rebelling about his medications and gets into power struggle with her about when he does what she asks him to do. When she can look at his behavior logically, she can understand and empathize, but often she takes his comments and delays personally, and goes into an emotional reaction, i.e. "why is he doing this to me?'  Her family continues to remain out of contact and she kicks herself for repeatedly reaching out to them, making herself vulnerable to hurt.  We identified that she plans to continue to do this, "because I don't give up".  Re-frame to "I am going to keep trying to have relationship with them even though they have not yet responded."  Avon says that she and her husband are doing better, working on the marriage, and he is very helpful with the children on the weekends.  In fact, she had most of last week child-free and the parents had the babies and Madison Wells stayed with his Dad. We have talked about the fact that as her children were growing up, she made a great effort to subdue her volatility and "clean up my mouth", something she is not doing currently.  Her constant physical pain in her back interferes with her ability to do all the tasks she would like to do and that her children are  expecting of her.  Today, with three appointments out of the house, she has prepared for the probability that she will have to go to bed when she gets home.  She uses good judgment by not taking opiates when she will be driving, especially with the "babies" in the car.  Suicidal/Homicidal: Nowithout intent/plan  Therapist Response: Clarification of mood, and confrontation of excessive personalization;  Attempted to redirect her complaints about behavior that is typical of immature teens to problem-solving style.  She has the knowledge of what works best with them, and knows that she would prefer not to yell and curse.  Validated the frustration of dealing with a 72 year old who is wanting to argue about everything.   Plan: Return again in 3 weeks.  Diagnosis: Axis I: Bipolar, Depressed    Axis II: Cluster B Traits    Kalliopi Coupland, RN 08/31/2011

## 2011-09-06 NOTE — Progress Notes (Signed)
Addended by: Ashok Cordia on: 09/06/2011 02:17 PM   Modules accepted: Level of Service

## 2011-09-13 ENCOUNTER — Encounter
Payer: Medicaid Other | Attending: Physical Medicine and Rehabilitation | Admitting: Physical Medicine and Rehabilitation

## 2011-09-13 ENCOUNTER — Encounter: Payer: Self-pay | Admitting: Physical Medicine and Rehabilitation

## 2011-09-13 VITALS — BP 116/59 | HR 68 | Resp 16 | Ht 65.0 in | Wt 321.0 lb

## 2011-09-13 DIAGNOSIS — M545 Low back pain, unspecified: Secondary | ICD-10-CM

## 2011-09-13 DIAGNOSIS — M25569 Pain in unspecified knee: Secondary | ICD-10-CM

## 2011-09-13 DIAGNOSIS — G8929 Other chronic pain: Secondary | ICD-10-CM | POA: Insufficient documentation

## 2011-09-13 DIAGNOSIS — G5600 Carpal tunnel syndrome, unspecified upper limb: Secondary | ICD-10-CM | POA: Insufficient documentation

## 2011-09-13 DIAGNOSIS — M79609 Pain in unspecified limb: Secondary | ICD-10-CM | POA: Insufficient documentation

## 2011-09-13 DIAGNOSIS — M25562 Pain in left knee: Secondary | ICD-10-CM

## 2011-09-13 DIAGNOSIS — M129 Arthropathy, unspecified: Secondary | ICD-10-CM | POA: Insufficient documentation

## 2011-09-13 DIAGNOSIS — M25561 Pain in right knee: Secondary | ICD-10-CM

## 2011-09-13 MED ORDER — TRAMADOL HCL 50 MG PO TABS: 50.0000 mg | ORAL_TABLET | Freq: Two times a day (BID) | ORAL | Status: DC

## 2011-09-13 NOTE — Patient Instructions (Signed)
Madison Wells will see her back in 2 months. Please keep your pain medications locked up in a secure location.

## 2011-09-13 NOTE — Progress Notes (Signed)
Subjective:    Patient ID: Madison Wells, female    DOB: Oct 14, 1955, 56 y.o.   MRN: [id number]  HPI  [name] is a pleasant 56 year old woman who is followed at  our [facility] for multiple pain  complaints including parascapular pain, bilateral hand numbness, and  pain worse on the left than on the right side, lumbago, and bilateral  knee pain. She has average pain scores of about a 5 or 6 with  medication and about a 7 without medication. She reports pain is worse  with activities such as prolonged standing, using her left upper  extremity. Pain is worse in the morning toward the end of the day.  Sleep is fair. She reports fair relief with current medications.  She takes tramadol 50 mg twice a day as well as gabapentin 300 mg 3  times a day.  Her chief complaint today is left hand numbness, tingling and  intermittent pain. She has been wearing a night splint at night.  Recent electrodiagnostic study showed moderate carpal tunnel on this  side. She has been using night splints for about 6 weeks now and she  Had left carpal tunnel injection a few weeks ago and notes some improvement in her pain.   Reports a fall last week. She was walking into a bathroom somewhat had left a bottom drawer in a cabinet open. She tripped on the open drawer and fell in the bathroom. She reports bruising in her left shoulder and  left knee. She is not had any x-rays. She reports overall improvement since the fall. She's been using modalities to bruise areas. She states she hit her left elbow  Pain Inventory Average Pain 5 Pain Right Now 5 My pain is sharp, burning and tingling  In the last 24 hours, has pain interfered with the following? General activity 3 Relation with others 3 Enjoyment of life 3 What TIME of day is your pain at its worst? evening Sleep (in general) Poor  Pain is worse with: walking, sitting, standing and some activites Pain  improves with: rest, heat/ice and medication Relief from Meds: 7  Mobility use a cane how many minutes can you walk? 20 ability to climb steps?  yes do you drive?  yes  Function disabled: date disabled 08/2010  Neuro/Psych numbness depression anxiety  Prior Studies Any changes since last visit?  no  Physicians involved in your care Any changes since last visit?  no   Review of Systems  Constitutional: Negative.   HENT: Negative.   Eyes: Negative.   Respiratory: Negative.   Cardiovascular: Negative.   Gastrointestinal: Negative.   Genitourinary: Negative.   Musculoskeletal: Negative.   Skin: Negative.   Neurological: Positive for numbness.  Hematological: Negative.   Psychiatric/Behavioral: Negative.        Objective:   Physical Exam    She is oriented x3. Speech is clear. Affect is bright.  She is alert, cooperative, and pleasant. Follows commands without  difficulty. Answers my questions appropriately.  Cranial nerves and  coordination are intact.   Her reflexes are 1+, bilateral upper and lower  extremities without abnormal tone, clonus, or tremors.   Motor strength is good in both upper and both lower extremities. She  has some numbness in the median distribution on the left, intact on the  right. No focal weakness is noted in thumb abduction or intrinsics, or grip.  Motor strength in the lower extremities is good without focal deficit.  No straight leg raise is negative.  She transitions easily from sitting to standing.   Her gait is not  antalgic.   Tandem gait and Romberg test are performed adequately. She  is able to flex forward with some limitation. She does complain of some  low back pain with forward flexion. Extension does not bother her quite  as much today.   9 cm x 9 cm ecchymosis over her left knee. She has a 2 cm x 2 cm ecchymosis over her left scapula. She has full range of motion at her knees without effusion. She has full shoulder  range of motion without pain. There is no bruise over the left elbow. There is no birthing in the left upper extremity    Assessment & Plan:  1. Left hand pain with diagnosis of moderate carpal tunnel syndrome  per electrodiagnostic studies done earlier this year. The  patient had been using hand splint without much improvement. She underwent carpal tunnel injection recently. She reports some improvement with this.   2. Low back pain, which is consistent with facet and degenerative disk  disease. I have asked the patient to maintain her current activity  level. I would like to see her walking on a daily basis. Avoid  prolonged sitting or standing. I would like her to lose weight and  stop smoking. I reviewed body mechanics again with her today Morrie Sheldon regarding bending and lifting   3. Bilateral knee pain. The patient reports improvement in knee pain and has discontinued use of her  cane recently.  We have discussed consideration of physical therapy for her knees. At this time she declines but will let me know if she wishes to pursue this avenue of treatment.     She declines xray to bruised areas.   07/31/2011 *RADIOLOGY REPORT* Clinical Data: Right knee pain for 4 weeks. No injury. RIGHT KNEE - 1-2 VIEW Comparison: 09/13/2009 Findings: Two views of the right knee were obtained. There is medial joint space narrowing with osteophyte formation. Difficult to exclude a suprapatellar joint effusion. Negative for acute fracture or dislocation. A small spur along the superior aspect of the patella.   IMPRESSION: Mild osteoarthritic changes in the right knee. No acute bony abnormality. Difficult to exclude a suprapatellar joint effusion. Original Report Authenticated By: Richarda Overlie, M.D.        PLAN:  Reviewing medications I note she has 2 refills on her gabapentin. It appears she is on her last bottle of tramadol and I will refill this with one extra refill. I will see her back in 2  months.  She's been taking her medications as prescribed.   I have answered all her questions. She is comfortable with this  plan currently.   non-narcotic management only In August 2012 referral source and patient aware of non narcotic treatment.

## 2011-09-15 ENCOUNTER — Ambulatory Visit (INDEPENDENT_AMBULATORY_CARE_PROVIDER_SITE_OTHER): Payer: Medicaid Other | Admitting: Psychiatry

## 2011-09-15 ENCOUNTER — Other Ambulatory Visit (HOSPITAL_COMMUNITY): Payer: Self-pay

## 2011-09-15 ENCOUNTER — Ambulatory Visit (HOSPITAL_COMMUNITY): Payer: Self-pay | Admitting: Psychiatry

## 2011-09-15 ENCOUNTER — Encounter (HOSPITAL_COMMUNITY): Payer: Self-pay | Admitting: Psychiatry

## 2011-09-15 VITALS — BP 136/68 | HR 73 | Ht 65.0 in | Wt 323.2 lb

## 2011-09-15 DIAGNOSIS — F315 Bipolar disorder, current episode depressed, severe, with psychotic features: Secondary | ICD-10-CM

## 2011-09-15 DIAGNOSIS — F319 Bipolar disorder, unspecified: Secondary | ICD-10-CM

## 2011-09-15 MED ORDER — LAMOTRIGINE 150 MG PO TABS: 150.0000 mg | ORAL_TABLET | Freq: Every day | ORAL | Status: DC

## 2011-09-15 MED ORDER — TRAZODONE HCL 100 MG PO TABS: 100.0000 mg | ORAL_TABLET | Freq: Every day | ORAL | Status: DC

## 2011-09-15 MED ORDER — DULOXETINE HCL 60 MG PO CPEP: 60.0000 mg | ORAL_CAPSULE | Freq: Every day | ORAL | Status: DC

## 2011-09-15 NOTE — Progress Notes (Signed)
Chief complaint Medication management and followup.  History of present illness Patient is 56 year old Caucasian female who came for her followup appointment.  She's taking Lamictal 150 mg and reported no side effects.  She denies any rash or itching.  She continues to have episodic agitation and anger but they are less intense and less frequent.  She likes increase Lamictal .  He denies to endorse pain in her body.  She is taking multiple pain medication .  Recently she was very disappointed as she was offered from her mother to go [facility] however she could not go due to financial reasons .  Patient is planning to go [place] next year with her daughter and she's excited about it.  Patient sleeps fine.  She denies any crying spells or any feeling of hopelessness or helplessness.  She denies any active or passive suicidal thinking.  She is seeing therapist regularly.  Patient has a drinking or using any illegal substance.  Current psychiatric medication Abilify 5 mg daily Cymbalta 30 mg daily Neurontin 300 mg 3 times a day Lamictal 100 mg daily Trazodone 100 mg daily Wellbutrin SR 150 mg daily  Psychosocial history Patient lives with her the daughter, 2 grandchildren and one son.  She's not working and receives disability.  Medical history Patient has history of hypertension obesity, angina, chronic pain and knee arthroplasty.  She takes multiple medications for her pain.   Mental status examination Patient is casually dressed and fairly groomed. She is pleasant and cooperative. She described her mood isgood and her affect is mood congruent.  She is still tearful at times when she is talking about her pain. She denies any active or passive suicidal thinking and homicidal thinking. Her thought process is logical linear and goal-directed. She denies any paranoia or delusional thinking. She denies any auditory or visual hallucination. Her attention and concentration is fair. She's alert and  oriented x3. Her insight judgment and impulse control is okay.  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 Depressive disorder with psychotic features Axis II deferred Axis III multiple medical problems Axis IV moderate Axis V 60-65  Plan Patient is tolerating her medication without any side effects.  I recommend to increase her Cymbalta to 60 mg a stopped Wellbutrin as it is known he to give to antidepressant.  Increase Cymbalta may also help her pain .  I will continue current dose of Lamictal but patient likes .  I encourage her to see therapist regularly.  I also encouraged her to monitor her weight to decrease calorie intake and regular exercise.  Patient started watching her diet more carefully .  I explained the risks and benefits of medication.  I will see her again in 4 weeks.  I recommend to call us if she is a question or concern about the medication or if she feels worsening of the symptoms.  Time spent 30 minutes.

## 2011-09-21 ENCOUNTER — Ambulatory Visit (INDEPENDENT_AMBULATORY_CARE_PROVIDER_SITE_OTHER): Payer: Medicaid Other | Admitting: Psychology

## 2011-09-21 DIAGNOSIS — F319 Bipolar disorder, unspecified: Secondary | ICD-10-CM

## 2011-09-21 NOTE — Progress Notes (Signed)
   THERAPIST PROGRESS NOTE  Session Time: 0940- 1020  Participation Level: Active  Behavioral Response: Casual and NeatAlertEuthymic  Type of Therapy: Individual Therapy  Treatment Goals addressed: Coping  Interventions: Supportive and Reframing  Summary: Madison Wells is a 56 y.o. female who presents with report of feeling very positive, enjoying being able to plan for a future trip, and having finnly had positive communication with her mother.  State her mother asked for updates on her grandchildren and even acknowledged her great-grandchildren. This has been something [name] has been looking for, for a long time, and has made a huge difference to her.  Husband continues to come by frequently and is spending more quality time with the children.  Suicidal/Homicidal: Nowithout intent/plan  Therapist Response:Re-framed the action that [name] has been taking to contribute to the harmony in the household.  Her approach means a lot to the overall atmosphere.  She is working hard to talk to her husband in ways that he can hear her and appreciate the points she makes.  "It is very slow work", she says, but she is feeling optimistic about it.  Plan: Return again in 4 weeks.  Continue to support her in being assertive and in maintaining appropriate boundaries.  Diagnosis: Axis I: Bipolar, Type I    Axis II: No diagnosis    [name], RN 09/21/2011

## 2011-09-29 ENCOUNTER — Encounter: Payer: Self-pay | Admitting: Obstetrics and Gynecology

## 2011-10-05 ENCOUNTER — Encounter: Payer: Self-pay | Admitting: Obstetrics and Gynecology

## 2011-10-05 ENCOUNTER — Ambulatory Visit (INDEPENDENT_AMBULATORY_CARE_PROVIDER_SITE_OTHER): Payer: Medicaid Other | Admitting: Obstetrics and Gynecology

## 2011-10-05 VITALS — BP 120/72 | Ht 65.0 in | Wt 310.0 lb

## 2011-10-05 DIAGNOSIS — N950 Postmenopausal bleeding: Secondary | ICD-10-CM

## 2011-10-05 MED ORDER — MEDROXYPROGESTERONE ACETATE 10 MG PO TABS: 10.0000 mg | ORAL_TABLET | Freq: Every day | ORAL | Status: DC

## 2011-10-05 NOTE — Patient Instructions (Signed)
Postmenopausal Bleeding Menopause is commonly referred to as the "change in life." It is a time when the fertile years, the time of ovulating and having menstrual periods, has come to an end. It is also determined by not having menstrual periods for 12 months.  Postmenopausal bleeding is any bleeding a woman has after she has entered into menopause. Any type of postmenopausal bleeding, even if it appears to be a typical menstrual period, is concerning. This should be evaluated by your caregiver.  CAUSES   Hormone therapy.   Cancer of the cervix or cancer of the lining of the uterus (endometrial cancer).   Thinning of the uterine lining (uterine atrophy).   Thyroid diseases.   Certain medicines.   Infection of the uterus or cervix.   Inflammation or irritation of the uterine lining (endometritis).   Estrogen-secreting tumors.   Growths (polyps) on the cervix, uterine lining, or uterus.   Uterine tumors (fibroids).   Being very overweight (obese).  DIAGNOSIS  Your caregiver will take a medical history and ask questions. A physical exam will also be performed. Further tests may include:   A transvaginal ultrasound. An ultrasound wand or probe is inserted into your vagina to view the pelvic organs.   A biopsy of the lining of the uterus (endometrium). A sample of the endometrium is removed and examined.   A hysteroscopy. Your caregiver may use an instrument with a light and a camera attached to it (hysteroscope). The hysteroscope is used to look inside the uterus for problems.   A dilation and curettage (D&C). Tissue is removed from the uterine lining to be examined for problems.  TREATMENT  Treatment depends on the cause of the bleeding. Some treatments include:   Surgery.   Medicines.   Hormones.   A hysteroscopy or D&C to remove polyps or fibroids.   Changing or stopping a current medicine you are taking.  Talk to your caregiver about your specific treatment. HOME CARE  INSTRUCTIONS   Maintain a healthy weight.   Keep regular pelvic exams and Pap tests.  SEEK MEDICAL CARE IF:   You have bleeding, even if it is light in comparison to your previous periods.   Your bleeding lasts more than 1 week.   You have abdominal pain.   You develop bleeding with sexual intercourse.  SEEK IMMEDIATE MEDICAL CARE IF:   You have a fever, chills, headache, dizziness, muscle aches, and bleeding.   You have severe pain with bleeding.   You are passing blood clots.   You have bleeding and need more than 1 pad an hour.   You feel faint.  MAKE SURE YOU:  Understand these instructions.   Will watch your condition.   Will get help right away if you are not doing well or get worse.  Document Released: 08/18/2005 Document Revised: 04/29/2011 Document Reviewed: 01/14/2011 [employer] Patient Information 2012 [employer]. 

## 2011-10-05 NOTE — Progress Notes (Signed)
When did bleeding start: 08/15/11, 09/29/11 How  Long: about 3 weeks How often changing pad/tampon: varies depending on flow. May change it two to three times a day  Bleeding Disorders: no Contraception: no Fibroids: no Hormone Therapy: no New Medications: no Menopausal Symptoms: yes, Pt stopped having periods for 1 year, then returned  Vag. Discharge: no Abdominal Pain: no Increased Stress: no Loralyn [place] Physical Examination: General appearance - alert, well appearing, and in no distress Chest - clear to auscultation, no wheezes, rales or rhonchi, symmetric air entry Heart - normal rate and regular rhythm Abdomen - soft, nontender, nondistended, no masses or organomegaly Breasts - breasts appear normal, no suspicious masses, no skin or nipple changes or axillary nodes, patient declines to have breast exam Pelvic - normal external genitalia, vulva, vagina, cervix, uterus and adnexa, dark spotting noted Rectal - normal rectal, no masses Musculoskeletal - no joint tenderness, deformity or swelling Extremities - peripheral pulses normal, no pedal edema, no clubbing or cyanosis Skin - normal coloration and turgor, no rashes, no suspicious skin lesions noted Menopausal Bleeding RTO for [place] with poss SHG and EMBX Provera 10 mg qd to stop bleeding Pt refused mammogram

## 2011-10-11 ENCOUNTER — Ambulatory Visit (HOSPITAL_COMMUNITY): Payer: Self-pay | Admitting: Psychiatry

## 2011-10-14 ENCOUNTER — Other Ambulatory Visit: Payer: Self-pay | Admitting: Physical Medicine and Rehabilitation

## 2011-10-16 ENCOUNTER — Other Ambulatory Visit (HOSPITAL_COMMUNITY): Payer: Self-pay | Admitting: Psychiatry

## 2011-10-18 ENCOUNTER — Other Ambulatory Visit (HOSPITAL_COMMUNITY): Payer: Self-pay | Admitting: Psychiatry

## 2011-10-19 ENCOUNTER — Ambulatory Visit (INDEPENDENT_AMBULATORY_CARE_PROVIDER_SITE_OTHER): Payer: Medicaid Other | Admitting: Psychology

## 2011-10-19 DIAGNOSIS — F3162 Bipolar disorder, current episode mixed, moderate: Secondary | ICD-10-CM

## 2011-10-19 NOTE — Progress Notes (Signed)
   THERAPIST PROGRESS NOTE  Session Time: 1610 - 1040  Participation Level: Active  Behavioral Response: NeatAlertDysphoric and Irritable  Type of Therapy: Individual Therapy  Treatment Goals addressed: Communication: with youngest daughter and Coping  Interventions: CBT and Psychosocial Skills: changing won response to daughter's angry words; lower voice, remain calm  Summary: Madison Wells is a 56 y.o. female who presents with having had a rough week and morning.  Late arriving because of lateness of family to get to house to care for the babies.  Angry when people "don't keep their word".  Having active conflict with youngest daughter, [name], over the graduation party she is helping to pay for after the ceremony where [name] will  "walk" in representation of completing HS, via GED that she earned last winter. Michon sees that "walk" as symbolic that she has done well with her children because she got them all through [employer].  Skyler "is so much like me, and she accuses me of caring more for her sisters and doing more for them and that isn't true and I can't get her to understand that, so we end up yelling at each other."   Affect is tearful, speech pressured at first until she has told her story. Thinking logical and goal directed without delusions of illusions.  Denies SI/HI or any hallucinations.  Reports she and husband are making good progress at reconciling and she is giving him a lot of credit for trying to be more helpful and considerate.    Suicidal/Homicidal: Nowithout intent/plan  Therapist Response: Challenged the importance of the act of walking across the stage as this is something that her daughter could also refuse to do if they are at odds that day.  Oniyah is adamant that this is very important to her, a sort of validation of her value as a mother, although she recognized that her daughter could possible decide not to do it.  Discussed the coming termination  with me and talked about using the next session to develop the treatment plan for continuation with the next therapist.     Plan: Return again in 2 weeks.  Diagnosis: Axis I: Bipolar, Depressed and Bipolar, mixed    Axis II: Cluster B Traits    Lyfe Reihl, RN 10/19/2011

## 2011-10-19 NOTE — Progress Notes (Signed)
Patient ID: Madison Wells, female   DOB: 1955-11-14, 56 y.o.   MRN: [id number]

## 2011-10-21 ENCOUNTER — Other Ambulatory Visit: Payer: Self-pay | Admitting: Physical Medicine and Rehabilitation

## 2011-10-21 ENCOUNTER — Other Ambulatory Visit (HOSPITAL_COMMUNITY): Payer: Self-pay | Admitting: Psychiatry

## 2011-10-21 DIAGNOSIS — F319 Bipolar disorder, unspecified: Secondary | ICD-10-CM

## 2011-10-25 ENCOUNTER — Ambulatory Visit (INDEPENDENT_AMBULATORY_CARE_PROVIDER_SITE_OTHER): Payer: Medicaid Other | Admitting: Obstetrics and Gynecology

## 2011-10-25 ENCOUNTER — Other Ambulatory Visit: Payer: Self-pay | Admitting: Obstetrics and Gynecology

## 2011-10-25 ENCOUNTER — Ambulatory Visit (INDEPENDENT_AMBULATORY_CARE_PROVIDER_SITE_OTHER): Payer: Medicaid Other

## 2011-10-25 DIAGNOSIS — N920 Excessive and frequent menstruation with regular cycle: Secondary | ICD-10-CM

## 2011-10-25 DIAGNOSIS — N924 Excessive bleeding in the premenopausal period: Secondary | ICD-10-CM

## 2011-10-25 DIAGNOSIS — N950 Postmenopausal bleeding: Secondary | ICD-10-CM

## 2011-10-25 MED ORDER — MEDROXYPROGESTERONE ACETATE 10 MG PO TABS: 10.0000 mg | ORAL_TABLET | Freq: Every day | ORAL | Status: DC

## 2011-10-25 NOTE — Patient Instructions (Signed)
Ovarian Cyst The ovaries are small organs that are on each side of the uterus. The ovaries are the organs that produce the female hormones, estrogen and progesterone. An ovarian cyst is a sac filled with fluid that can vary in its size. It is normal for a small cyst to form in women who are in the childbearing age and who have menstrual periods. This type of cyst is called a follicle cyst that becomes an ovulation cyst (corpus luteum cyst) after it produces the women's egg. It later goes away on its own if the woman does not become pregnant. There are other kinds of ovarian cysts that may cause problems and may need to be treated. The most serious problem is a cyst with cancer. It should be noted that menopausal women who have an ovarian cyst are at a higher risk of it being a cancer cyst. They should be evaluated very quickly, thoroughly and followed closely. This is especially true in menopausal women because of the high rate of ovarian cancer in women in menopause. CAUSES AND TYPES OF OVARIAN CYSTS:  FUNCTIONAL CYST: The follicle/corpus luteum cyst is a functional cyst that occurs every month during ovulation with the menstrual cycle. They go away with the next menstrual cycle if the woman does not get pregnant. Usually, there are no symptoms with a functional cyst.   ENDOMETRIOMA CYST: This cyst develops from the lining of the uterus tissue. This cyst gets in or on the ovary. It grows every month from the bleeding during the menstrual period. It is also called a "chocolate cyst" because it becomes filled with blood that turns brown. This cyst can cause pain in the lower abdomen during intercourse and with your menstrual period.   CYSTADENOMA CYST: This cyst develops from the cells on the outside of the ovary. They usually are not cancerous. They can get very big and cause lower abdomen pain and pain with intercourse. This type of cyst can twist on itself, cut off its blood supply and cause severe pain.  It also can easily rupture and cause a lot of pain.   DERMOID CYST: This type of cyst is sometimes found in both ovaries. They are found to have different kinds of body tissue in the cyst. The tissue includes skin, teeth, hair, and/or cartilage. They usually do not have symptoms unless they get very big. Dermoid cysts are rarely cancerous.   POLYCYSTIC OVARY: This is a rare condition with hormone problems that produces many small cysts on both ovaries. The cysts are follicle-like cysts that never produce an egg and become a corpus luteum. It can cause an increase in body weight, infertility, acne, increase in body and facial hair and lack of menstrual periods or rare menstrual periods. Many women with this problem develop type 2 diabetes. The exact cause of this problem is unknown. A polycystic ovary is rarely cancerous.   THECA LUTEIN CYST: Occurs when too much hormone (human chorionic gonadotropin) is produced and over-stimulates the ovaries to produce an egg. They are frequently seen when doctors stimulate the ovaries for invitro-fertilization (test tube babies).   LUTEOMA CYST: This cyst is seen during pregnancy. Rarely it can cause an obstruction to the birth canal during labor and delivery. They usually go away after delivery.  SYMPTOMS   Pelvic pain or pressure.   Pain during sexual intercourse.   Increasing girth (swelling) of the abdomen.   Abnormal menstrual periods.   Increasing pain with menstrual periods.   You stop having   menstrual periods and you are not pregnant.  DIAGNOSIS  The diagnosis can be made during:  Routine or annual pelvic examination (common).   Ultrasound.   X-ray of the pelvis.   CT Scan.   MRI.   Blood tests.  TREATMENT   Treatment may only be to follow the cyst monthly for 2 to 3 months with your caregiver. Many go away on their own, especially functional cysts.   May be aspirated (drained) with a long needle with ultrasound, or by laparoscopy  (inserting a tube into the pelvis through a small incision).   The whole cyst can be removed by laparoscopy.   Sometimes the cyst may need to be removed through an incision in the lower abdomen.   Hormone treatment is sometimes used to help dissolve certain cysts.   Birth control pills are sometimes used to help dissolve certain cysts.  HOME CARE INSTRUCTIONS  Follow your caregiver's advice regarding:  Medicine.   Follow up visits to evaluate and treat the cyst.   You may need to come back or make an appointment with another caregiver, to find the exact cause of your cyst, if your caregiver is not a gynecologist.   Get your yearly and recommended pelvic examinations and Pap tests.   Let your caregiver know if you have had an ovarian cyst in the past.  SEEK MEDICAL CARE IF:   Your periods are late, irregular, they stop, or are painful.   Your stomach (abdomen) or pelvic pain does not go away.   Your stomach becomes larger or swollen.   You have pressure on your bladder or trouble emptying your bladder completely.   You have painful sexual intercourse.   You have feelings of fullness, pressure, or discomfort in your stomach.   You lose weight for no apparent reason.   You feel generally ill.   You become constipated.   You lose your appetite.   You develop acne.   You have an increase in body and facial hair.   You are gaining weight, without changing your exercise and eating habits.   You think you are pregnant.  SEEK IMMEDIATE MEDICAL CARE IF:   You have increasing abdominal pain.   You feel sick to your stomach (nausea) and/or vomit.   You develop a fever that comes on suddenly.   You develop abdominal pain during a bowel movement.   Your menstrual periods become heavier than usual.  Document Released: 05/10/2005 Document Revised: 04/29/2011 Document Reviewed: 03/13/2009 [employer] Patient Information 2012 [employer]. 

## 2011-10-25 NOTE — Progress Notes (Signed)
Pt prepped and draped.  embx done per protocol.  Uterus sounded to 7 cm SHG done.  OS finder used. SHG WNL.   [place] sig for a simple right ovarian cyst 2.6 cm Uterus 7.85x 2.95 PMVB EMBX done Take provera 10mg  qd for ten day q month until no longer has a withdrawal bleed RT 3 months to follow up ovrian cyst

## 2011-10-28 ENCOUNTER — Telehealth: Payer: Self-pay

## 2011-10-28 NOTE — Telephone Encounter (Signed)
Spoke with pt rgd labs informed endo bx wnl pt voice understanding 

## 2011-10-28 NOTE — Telephone Encounter (Signed)
Message copied by Rolla Plate on Thu Oct 28, 2011 11:23 AM ------      Message from: Jaymes Graff      Created: Wed Oct 27, 2011 11:53 PM       Please call the patient and let her know her endometrial biopsy is normal

## 2011-11-01 ENCOUNTER — Encounter: Payer: Medicaid Other | Admitting: Physical Medicine and Rehabilitation

## 2011-11-24 ENCOUNTER — Ambulatory Visit (INDEPENDENT_AMBULATORY_CARE_PROVIDER_SITE_OTHER): Payer: Medicaid Other | Admitting: Psychiatry

## 2011-11-24 ENCOUNTER — Encounter (HOSPITAL_COMMUNITY): Payer: Self-pay | Admitting: Psychiatry

## 2011-11-24 VITALS — BP 141/88 | HR 80 | Wt 315.8 lb

## 2011-11-24 DIAGNOSIS — F329 Major depressive disorder, single episode, unspecified: Secondary | ICD-10-CM

## 2011-11-24 DIAGNOSIS — F319 Bipolar disorder, unspecified: Secondary | ICD-10-CM

## 2011-11-24 DIAGNOSIS — F315 Bipolar disorder, current episode depressed, severe, with psychotic features: Secondary | ICD-10-CM

## 2011-11-24 MED ORDER — TRAZODONE HCL 100 MG PO TABS: ORAL_TABLET | ORAL | Status: DC

## 2011-11-24 MED ORDER — ARIPIPRAZOLE 5 MG PO TABS: 5.0000 mg | ORAL_TABLET | Freq: Every day | ORAL | Status: DC

## 2011-11-24 MED ORDER — LAMOTRIGINE 200 MG PO TABS: 200.0000 mg | ORAL_TABLET | Freq: Every day | ORAL | Status: DC

## 2011-11-24 MED ORDER — DULOXETINE HCL 60 MG PO CPEP: 60.0000 mg | ORAL_CAPSULE | Freq: Every day | ORAL | Status: DC

## 2011-11-24 NOTE — Progress Notes (Signed)
Chief complaint I ran out of medication .  I'm feeling more stressed and agitated.  I start drinking .  I need my medication.    History of present illness Patient is 56 year old Caucasian female who came for her followup appointment.  She brought her grand kids and she is babysitting on them.  She had missed her last appointment .  She ran out of her medication.  She started to feel more depressed anxious and irritable.  She admitted drinking 4-6 glass of vodka on weekends.  Patient admitted that she was taking her medication she was not drinking .  Recently she is taking care of her grand kids who are out of the school and feel more stressed and overwhelmed.  She admitted some mood lability and agitation but denies any aggression violence or any suicidal thoughts.  She is hoping that she can go [facility] with her husband next month.  She is excited about it.  She did not remember any side effects of medication.  She likes Lamictal which is helping her mood and anger.  She admitted sometime she takes 2 trazodone for sleep .  She denies any active or passive suicidal thinking.  She has no tremors or shakes.  She is not using any illegal substance.  Her Lamictal was increased on her last visit.  Current psychiatric medication Abilify 5 mg daily Cymbalta 60 mg daily Neurontin 300 mg 3 times a day by primary care physician Lamictal 150 mg daily Trazodone 200 mg daily  Psychosocial history Patient lives with her the daughter, 4 grandchildren and one son.  She's not working and receives disability.  Medical history Patient has history of hypertension obesity, angina, chronic pain and knee arthroplasty.  She takes multiple medications for her pain.  Mental status examination Patient is casually dressed and fairly groomed.  She appears somewhat irritable .  She maintained fair eye contact .  Her speech is fast but coherent.  Her thought process is logical linear and goal-directed.  She described her  mood is irritable and frustrated .  She denies any active or passive suicidal thoughts or homicidal thoughts.  She denies any paranoia or delusional thinking.  Her attention and concentration is distracted at times.  She's alert and oriented x3.  There were no flight of idea or loose association.  Her insight judgment and pulse control is fair.  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 Depressive disorder with psychotic features Axis II deferred Axis III multiple medical problems Axis IV moderate Axis V 60-65  Plan I talked to the patient in length about her symptoms , noncompliance of medication and drinking.  Patient admitted that she's been drinking due to noncompliance with followup and medication.  She agreed to stop drinking but she had in the past when she was taking psychiatric medication.  I will restart her psychiatric medication and recommended to try Lamictal 200 mg at bedtime.  I will also increase her trazodone 200 mg at bedtime.  I recommend to call us if she is a question or concern about the medication .  I recommend to keep her appointment regularly to avoid any relapse.  Time spent 30 minutes.  I will see her again in 6 weeks.    Portion of this note is generated with voice recognition software and may contain typographical error.

## 2011-11-26 ENCOUNTER — Ambulatory Visit (INDEPENDENT_AMBULATORY_CARE_PROVIDER_SITE_OTHER): Payer: Medicaid Other | Admitting: Psychology

## 2011-11-26 DIAGNOSIS — F3177 Bipolar disorder, in partial remission, most recent episode mixed: Secondary | ICD-10-CM

## 2011-11-26 NOTE — Progress Notes (Signed)
THERAPIST PROGRESS NOTE  Session Time: 0830 - 0925  Participation Level: Active  Behavioral Response: Casual and NeatAlertEuthymic  Type of Therapy: Individual Therapy  Treatment Goals addressed: Coping  Interventions: Supportive and Reframing  Summary: Madison Wells is a 56 y.o. female who presents with smiles and report that things are going well.  Her husband had an accident at work and as a result, he has moved back home.  They had been moving in the direction of reconciliation, and so this is a permanent move.  She said that there has been a big change in his attitude, and that he sees how she "is his rock", holding things together for him and the family.  Overall, three children are doing very well getting along with each other, working and caring for their children when they are at home.  Her one daughter, Madison Wells, continues to distance herself and say negative things about her parents, but Madison Wells is able to let go of her need to change that for now.  The family is planning a trip to [facility] for a week at the end of August.  Their son, Madison Wells, 15, is doing much better at modulating his emotions and cooperating when she asks him to help in the house.  The two grandchildren that she cares for are growing and able to play together a bit more now that the baby is mobile.  Madison Wells reports that her mother is keeping in touch, as she said she would, so Madison Wells is feeling less like an outcast.  Suicidal/Homicidal: Nowithout intent/plan  Therapist Response: We talked about this being the last time I will see her.  She decided she needs a "mourning period" before she starts to see another therapist, so she will call for an appointment with Madison Wells for September, after her vacation.  We reviewed the progress toward previous goals and talked about ongoing goals to share with the new therapist.  Plan: Return again to see new therapist.  She elected to wait until September to come  back as things are going well and she will be taking a vacation with her family.  Diagnosis: Axis I: Bipolar, mixed in remission    Axis II: No diagnosis  Outpatient Counselor Treatment Plan                       First outpatient Visit:                          Case Summary:   1.  Problem: Easily overwhelmed by combination of her own health problems, caregiving for grandchildren, and household tasks.  Is in chronic pain from arthritis   Goal or Outcome:  Will maintain limitations on the amount of responsibility she takes on when other family members are available to help.  She will prioritize her activities to give herself the rest she needs each day.  Ongoing goal to be measured by her recognition of her degree of tiredness and irritability.    Interventions:  Review activities at each session, noting level of irritatility as reported by patient.  Help her with problem-solving skills to prioritize when she needs to limit herself.     Update/Progress:  2.  Problem:  Marital conflict and recent 9 month separation;  He gradually spent more time at the house, continued to pay bills, and has now returned home, as of June, 2013   Goal or outcome:  Couple  will be able to communicate clearly and productively, avoiding the conflicts of the past and Madison Wells will report that they have a good relationship and are doing well together still, 6 months from now.    Interventions:   Talk about the relationship status at each session, eliciting progress toward goal of working together to maintain the family harmony.  Help Madison Wells with clear and effective language to make her feelings known and to make requests.        Update/Progress:           3.  Problem:  Dealing with children with mental illness, treated and not treated.  Madison Wells, daughter 36, has been diagnosed with Bipolar Disorder, but refuses any treatment.  Son , Madison Wells, [name] carries several diagnoses, Bipolar, ODD, ADHD, but is currently  accepting medication without the former back-talk and doing well.  Madison Wells is not living in the home, and although Madison Wells disagrees with what she is doing, she has accepted that she cannot control her. She has run out of her own medication and noted her mood deteriorate.  Goal or outcome:  Will accept her children in spite of their provocative behavior, but maintain firm limits to protect her own mental health.  She will set the example of the importance of medication, by remaining compliant with her own.    Interventions:  Remind her of her resolves and of her role in being a model for them.  Help her to process the guilt and grief she feels over the loss of her daughter, Madison Wells, at least for now.  Reinforce her appropriate limit-setting activities.         Update/Progress:                 Madison Chock, RN 11/26/2011

## 2011-11-29 ENCOUNTER — Encounter
Payer: Medicaid Other | Attending: Physical Medicine and Rehabilitation | Admitting: Physical Medicine and Rehabilitation

## 2011-11-29 ENCOUNTER — Encounter: Payer: Self-pay | Admitting: Physical Medicine and Rehabilitation

## 2011-11-29 VITALS — BP 179/89 | HR 69 | Resp 16 | Ht 65.0 in | Wt 318.0 lb

## 2011-11-29 DIAGNOSIS — G8929 Other chronic pain: Secondary | ICD-10-CM | POA: Insufficient documentation

## 2011-11-29 DIAGNOSIS — M25569 Pain in unspecified knee: Secondary | ICD-10-CM

## 2011-11-29 DIAGNOSIS — M79609 Pain in unspecified limb: Secondary | ICD-10-CM | POA: Insufficient documentation

## 2011-11-29 DIAGNOSIS — M7661 Achilles tendinitis, right leg: Secondary | ICD-10-CM

## 2011-11-29 DIAGNOSIS — M25561 Pain in right knee: Secondary | ICD-10-CM

## 2011-11-29 DIAGNOSIS — M7660 Achilles tendinitis, unspecified leg: Secondary | ICD-10-CM

## 2011-11-29 DIAGNOSIS — M25562 Pain in left knee: Secondary | ICD-10-CM

## 2011-11-29 DIAGNOSIS — M545 Low back pain, unspecified: Secondary | ICD-10-CM

## 2011-11-29 DIAGNOSIS — G5600 Carpal tunnel syndrome, unspecified upper limb: Secondary | ICD-10-CM | POA: Insufficient documentation

## 2011-11-29 DIAGNOSIS — M129 Arthropathy, unspecified: Secondary | ICD-10-CM | POA: Insufficient documentation

## 2011-11-29 MED ORDER — DICLOFENAC SODIUM 1 % TD GEL: 1.0000 "application " | Freq: Four times a day (QID) | TRANSDERMAL | Status: DC

## 2011-11-29 MED ORDER — TRAMADOL HCL 50 MG PO TABS: 50.0000 mg | ORAL_TABLET | Freq: Two times a day (BID) | ORAL | Status: DC

## 2011-11-29 MED ORDER — MELOXICAM 7.5 MG PO TABS: 7.5000 mg | ORAL_TABLET | Freq: Every day | ORAL | Status: DC

## 2011-11-29 MED ORDER — GABAPENTIN 300 MG PO CAPS: 300.0000 mg | ORAL_CAPSULE | Freq: Three times a day (TID) | ORAL | Status: DC

## 2011-11-29 NOTE — Progress Notes (Signed)
Subjective:    Patient ID: Madison Wells, female    DOB: 10-31-1955, 56 y.o.   MRN: [id number]  HPI  Madison Wells is a pleasant 56 year old woman who is followed at  our [facility] for multiple pain  complaints including parascapular pain, bilateral hand numbness, and  pain worse on the left than on the right side, lumbago, and bilateral  knee pain. She has average pain scores of about a 5 or 6 with  medication and about a 7 without medication. She reports pain is worse  with activities such as prolonged standing, using her left upper  extremity. Pain is worse in the morning toward the end of the day.  Sleep is fair. She reports fair relief with current medications.  She takes tramadol 50 mg twice a day as well as gabapentin 300 mg 3  times a day.    Her chief complaint today is left hand numbness, tingling and  intermittent pain. She has been wearing a night splint at night.  Recent electrodiagnostic study showed moderate carpal tunnel on this  side. She has been using night splints for about 6 weeks now and she  Had left carpal tunnel injection a couple months  ago and notes some improvement in her pain.           Pain Inventory Average Pain 7 Pain Right Now 7 My pain is constant, sharp, burning, stabbing and aching  In the last 24 hours, has pain interfered with the following? General activity 6 Relation with others 6 Enjoyment of life 6 What TIME of day is your pain at its worst? daytime Sleep (in general) Poor  Pain is worse with: walking, bending, sitting, standing and some activites Pain improves with: rest, pacing activities and medication Relief from Meds: 5  Mobility walk without assistance how many minutes can you walk? 30 ability to climb steps?  yes do you drive?  yes  Function disabled: date disabled 2012 I need assistance with the following:  meal prep, household duties and  shopping  Neuro/Psych tingling trouble walking depression anxiety  Prior Studies Any changes since last visit?  no  Physicians involved in your care Any changes since last visit?  no   Family History  Problem Relation Age of Onset  . Alcohol abuse Mother   . Alcohol abuse Father   . Alcohol abuse Sister   . Alcohol abuse Brother    History   Social History  . Marital Status: Married    Spouse Name: N/A    Number of Children: N/A  . Years of Education: N/A   Social History Main Topics  . Smoking status: Current Everyday Smoker -- 0.5 packs/day for 40 years    Types: Cigarettes  . Smokeless tobacco: Never Used  . Alcohol Use: Yes     occassionally   . Drug Use: No  . Sexually Active: No   Other Topics Concern  . None   Social History Narrative  . None   Past Surgical History  Procedure Date  . Cesarean section   . Knee arthroplasty   . Tonsillectomy    Past Medical History  Diagnosis Date  . Obesity   . Hypertension   . Anxiety   . Bipolar 1 disorder   . Angina   . Cervicalgia   . Lumbago   . Enthesopathy of hip region   . Pain in joint, hand   . Disturbance of skin sensation   .  Chronic pain syndrome    BP 179/89  Pulse 69  Resp 16  Ht 5\' 5"  (1.651 m)  Wt 318 lb (144.244 kg)  BMI 52.92 kg/m2  SpO2 94%    Review of Systems  Musculoskeletal: Positive for back pain and gait problem.  Neurological:       Tingling  Psychiatric/Behavioral: Positive for dysphoric mood. The patient is nervous/anxious.   All other systems reviewed and are negative.       Objective:   Physical Exam  She is oriented x3. Speech is clear. Affect is bright.  She is alert, cooperative, and pleasant. Follows commands without  difficulty. Answers my questions appropriately.  Cranial nerves and  coordination are intact.  Her reflexes are 1+, bilateral upper and lower  extremities without abnormal tone, clonus, or tremors.  Motor strength is good in both  upper and both lower extremities. She  has some numbness in the median distribution on the left, intact on the  right. No focal weakness is noted in thumb abduction or intrinsics, or grip.  Motor strength in the lower extremities is good without focal deficit.  Straight leg raise is negative.  She transitions easily from sitting to standing.  Her gait is not  antalgic.    Tandem gait and Romberg test are performed adequately.   She is able to flex forward with some limitation. She does complain of some  low back pain with forward flexion.    Extension does not bother her quite  as much today.   Right ankle is examined today. Full range of motion noted.  Achilles tendon with mild swelling 1 cm proximal to insertion. The areas tender with palpation. Pain exacerbated with plantar flexion and passive dorsiflexion.     Assessment & Plan:    1. Left hand pain with diagnosis of moderate carpal tunnel syndrome  per electrodiagnostic studies done earlier this year. The  patient had been using hand splint without much improvement. She underwent carpal tunnel injection recently. She reports some improvement with this.    2. Low back pain, which is consistent with facet and degenerative disk  disease. I have asked the patient to maintain her current activity  level. I would like to see her walking on a daily basis. Avoid  prolonged sitting or standing. I would like her to lose weight and  stop smoking. I reviewed body mechanics again with her today especially regarding bending and lifting I gave her a brochure on medial branch blocks today she will read this over and think about lumbar injections.   3. Right Achilles tendinitis. Discussion with patient regarding rest, icing, beginning anti-inflammatory medication. I am also going to give her diclofenec gel to apply 4 times a day.   4. Bilateral knee pain. The patient reports improvement in knee pain and has discontinued use of her cane  recently.    We have discussed consideration of physical therapy for her knees.   At this time she declines but will let me know if she wishes to pursue this avenue of treatment.    Followup in 6 weeks

## 2011-11-29 NOTE — Patient Instructions (Signed)
  Followup with Madison Wells in 6 weeks  I have ordered voltaren gel to apply 4 times per day      Achilles Tendinitis Tendinitis a swelling and soreness of the tendon. The pain in the tendon (cord-like structure which attaches muscle to bone) is produced by tiny tears and the inflammation present in that tendon. It commonly occurs at the shoulders, heels, and elbows. It is usually caused by overusing the tendon and joint involved. Achilles tendinitis involves the Achilles tendon. This is the large tendon in the back of the leg just above the foot. It attaches the large muscles of the lower leg to the heel bone (called calcaneus).  This diagnosis (learning what is wrong) is made by examination. X-rays will be generally be normal if only tendinitis is present. HOME CARE INSTRUCTIONS   Apply ice to the injury for 15 to 20 minutes, 3 to 4 times per day. Put the ice in a plastic bag and place a towel between the bag of ice and your skin.   Try to avoid use other than gentle range of motion while the tendon is painful. Do not resume use until instructed by your caregiver. Then begin use gradually. Do not increase use to the point of pain. If pain does develop, decrease use and continue the above measures. Gradually increase activities that do not cause discomfort until you gradually achieve normal use.   Only take over-the-counter or prescription medicines for pain, discomfort, or fever as directed by your caregiver.  SEEK MEDICAL CARE IF:   Your pain and swelling increase or pain is uncontrolled with medications.   You develop new, unexplained problems (symptoms) or an increase of the symptoms that brought you to your caregiver.   You develop an inability to move your toes or foot, develop warmth and swelling in your foot, or begin running an unexplained temperature.  MAKE SURE YOU:   Understand these instructions.   Will watch your condition.   Will get help right away if you are not doing  well or get worse.  Document Released: 02/17/2005 Document Revised: 04/29/2011 Document Reviewed: 12/27/2007 [facility] Patient Information 2012 [place], [place].

## 2012-01-03 ENCOUNTER — Encounter: Payer: Medicaid Other | Admitting: Physical Medicine and Rehabilitation

## 2012-01-05 ENCOUNTER — Ambulatory Visit (HOSPITAL_COMMUNITY): Payer: Self-pay | Admitting: Psychiatry

## 2012-01-29 ENCOUNTER — Other Ambulatory Visit (HOSPITAL_COMMUNITY): Payer: Self-pay | Admitting: Psychiatry

## 2012-01-31 ENCOUNTER — Other Ambulatory Visit (HOSPITAL_COMMUNITY): Payer: Self-pay | Admitting: Psychiatry

## 2012-01-31 DIAGNOSIS — F319 Bipolar disorder, unspecified: Secondary | ICD-10-CM

## 2012-01-31 MED ORDER — ARIPIPRAZOLE 5 MG PO TABS: 5.0000 mg | ORAL_TABLET | Freq: Every day | ORAL | Status: DC

## 2012-01-31 MED ORDER — DULOXETINE HCL 60 MG PO CPEP: 60.0000 mg | ORAL_CAPSULE | Freq: Every day | ORAL | Status: DC

## 2012-02-02 ENCOUNTER — Other Ambulatory Visit: Payer: Self-pay | Admitting: Physical Medicine and Rehabilitation

## 2012-02-02 ENCOUNTER — Other Ambulatory Visit (HOSPITAL_COMMUNITY): Payer: Self-pay | Admitting: Psychiatry

## 2012-02-02 DIAGNOSIS — F319 Bipolar disorder, unspecified: Secondary | ICD-10-CM

## 2012-02-16 ENCOUNTER — Other Ambulatory Visit: Payer: Self-pay | Admitting: Physical Medicine and Rehabilitation

## 2012-03-05 ENCOUNTER — Other Ambulatory Visit: Payer: Self-pay | Admitting: Obstetrics and Gynecology

## 2012-03-05 ENCOUNTER — Other Ambulatory Visit (HOSPITAL_COMMUNITY): Payer: Self-pay | Admitting: Psychiatry

## 2012-03-05 ENCOUNTER — Other Ambulatory Visit: Payer: Self-pay | Admitting: Physical Medicine and Rehabilitation

## 2012-03-06 ENCOUNTER — Other Ambulatory Visit (HOSPITAL_COMMUNITY): Payer: Self-pay | Admitting: Psychiatry

## 2012-03-06 DIAGNOSIS — F319 Bipolar disorder, unspecified: Secondary | ICD-10-CM

## 2012-03-06 MED ORDER — DULOXETINE HCL 60 MG PO CPEP: 60.0000 mg | ORAL_CAPSULE | Freq: Every day | ORAL | Status: DC

## 2012-04-13 ENCOUNTER — Encounter (HOSPITAL_COMMUNITY): Payer: Self-pay | Admitting: Emergency Medicine

## 2012-04-13 ENCOUNTER — Emergency Department (HOSPITAL_COMMUNITY)
Admission: EM | Admit: 2012-04-13 | Discharge: 2012-04-13 | Disposition: A | Discharge status: Home / Self Care | Payer: Medicaid Other | Source: Home / Self Care | Attending: Emergency Medicine | Admitting: Emergency Medicine

## 2012-04-13 DIAGNOSIS — M546 Pain in thoracic spine: Secondary | ICD-10-CM

## 2012-04-13 DIAGNOSIS — M5414 Radiculopathy, thoracic region: Secondary | ICD-10-CM

## 2012-04-13 DIAGNOSIS — IMO0002 Reserved for concepts with insufficient information to code with codable children: Secondary | ICD-10-CM

## 2012-04-13 MED ORDER — CYCLOBENZAPRINE HCL 10 MG PO TABS: 10.0000 mg | ORAL_TABLET | Freq: Two times a day (BID) | ORAL | Status: DC | PRN

## 2012-04-13 MED ORDER — MELOXICAM 7.5 MG PO TABS: 7.5000 mg | ORAL_TABLET | Freq: Every day | ORAL | Status: DC

## 2012-04-13 NOTE — ED Provider Notes (Signed)
History     CSN: [id number]  Arrival date & time 04/13/12  1733   First MD Initiated Contact with Patient 04/13/12 1823      Chief Complaint  Patient presents with  . Numbness  . Back Pain    (Consider location/radiation/quality/duration/timing/severity/associated sxs/prior treatment) HPI Comments: Patient presents urgent care tonight describing for about 5 days should she's been noticing some discomfort and pain on the right upper back that worsens with movement and and bending. The pain shoots to her right upper extremity and has been also feeling numbness and tingling sensations all way down to her right fingers. She denies any weakness able to move her fingers and bend her wrist on and off he or you and.   Patient is a 56 y.o. female presenting with back pain. The history is provided by the patient.  Back Pain  This is a new problem. The current episode started more than 2 days ago. The problem occurs constantly. The problem has been gradually worsening. The pain is associated with no known injury. The pain is present in the thoracic spine. The quality of the pain is described as aching and shooting. The pain is at a severity of 8/10. The pain is moderate. The symptoms are aggravated by bending, twisting and certain positions. Associated symptoms include paresthesias and tingling. Pertinent negatives include no chest pain, no fever, no abdominal swelling, no bowel incontinence, no paresis and no weakness. She has tried nothing for the symptoms. The treatment provided mild relief.    Past Medical History  Diagnosis Date  . Obesity   . Hypertension   . Anxiety   . Bipolar 1 disorder   . Angina   . Cervicalgia   . Lumbago   . Enthesopathy of hip region   . Pain in joint, hand   . Disturbance of skin sensation   . Chronic pain syndrome     Past Surgical History  Procedure Date  . Cesarean section   . Knee arthroplasty   . Tonsillectomy     Family History  Problem  Relation Age of Onset  . Alcohol abuse Mother   . Alcohol abuse Father   . Alcohol abuse Sister   . Alcohol abuse Brother     History  Substance Use Topics  . Smoking status: Current Every Day Smoker -- 0.5 packs/day for 40 years    Types: Cigarettes  . Smokeless tobacco: Never Used  . Alcohol Use: Yes     Comment: occassionally     OB History    Grav Para Term Preterm Abortions TAB SAB Ect Mult Living   4 4 4       4       Review of Systems  Constitutional: Positive for activity change. Negative for fever, chills, diaphoresis and appetite change.  Cardiovascular: Negative for chest pain.  Gastrointestinal: Negative for bowel incontinence.  Musculoskeletal: Positive for back pain. Negative for myalgias, joint swelling and arthralgias.  Skin: Negative for color change, rash and wound.  Neurological: Positive for tingling and paresthesias. Negative for weakness.    Allergies  Review of patient's allergies indicates no known allergies.  Home Medications   Current Outpatient Rx  Name  Route  Sig  Dispense  Refill  . ALBUTEROL SULFATE HFA 108 (90 BASE) MCG/ACT IN AERS   Inhalation   Inhale 1-2 puffs into the lungs every 6 (six) hours as needed for wheezing.   1 Inhaler   0   . ARIPIPRAZOLE 5 MG PO  TABS   Oral   Take 1 tablet (5 mg total) by mouth daily.   30 tablet   0   . VITAMIN D PO   Oral   Take 1.25 mg by mouth 2 (two) times a week.         Marland Kitchen CLOTRIMAZOLE 1 % EX CREA   Topical   Apply 1 application topically 2 (two) times daily.         . CYCLOBENZAPRINE HCL 10 MG PO TABS   Oral   Take 1 tablet (10 mg total) by mouth 2 (two) times daily as needed for muscle spasms.   20 tablet   0   . DULOXETINE HCL 60 MG PO CPEP   Oral   Take 1 capsule (60 mg total) by mouth daily.   30 capsule   0     Must see clinician for future refills.   . FUROSEMIDE 40 MG PO TABS   Oral   Take 1 tablet (40 mg total) by mouth daily.   30 tablet   1   . GABAPENTIN  300 MG PO CAPS      TAKE 1 CAPSULE (300 MG TOTAL) BY MOUTH 3 (THREE) TIMES DAILY.   90 capsule   1   . MICROZIDE PO   Oral   Take 25 mg by mouth daily.         Marland Kitchen LAMOTRIGINE 200 MG PO TABS      TAKE 1 TABLET (200 MG TOTAL) BY MOUTH DAILY.   30 tablet   0     Please flag RX-Needs to make appointment for follo ...   . MEDROXYPROGESTERONE ACETATE 10 MG PO TABS   Oral   Take 1 tablet (10 mg total) by mouth daily.   30 tablet   1   . MEDROXYPROGESTERONE ACETATE 10 MG PO TABS   Oral   Take 1 tablet (10 mg total) by mouth daily.   30 tablet   1     Take one tablet daily each day for ten days every  ...   . MELOXICAM 7.5 MG PO TABS   Oral   Take 1 tablet (7.5 mg total) by mouth daily.   14 tablet   0   . MELOXICAM 7.5 MG PO TABS   Oral   Take 1 tablet (7.5 mg total) by mouth daily.   15 tablet   0   . NABUMETONE 750 MG PO TABS   Oral   Take 750 mg by mouth 2 (two) times daily as needed.         Marland Kitchen OLMESARTAN MEDOXOMIL-HCTZ 40-25 MG PO TABS   Oral   Take 1 tablet by mouth daily.         Marland Kitchen KLOR-CON M20 PO   Oral   Take 20 mEq by mouth daily.         . TRAZODONE HCL 100 MG PO TABS      TAKE 2 TABLETS AT BED TIME   60 tablet   0     Please flag RX-Needs to make appointment for follo ...   . TRIAMCINOLONE ACETONIDE 0.5 % EX CREA   Topical   Apply topically 2 (two) times daily.   30 g   0     BP 143/62  Pulse 73  Temp 99.1 F (37.3 C)  Resp 16  SpO2 95%  Physical Exam  Nursing note and vitals reviewed. Constitutional: She appears well-developed and well-nourished.  Non-toxic appearance. She  does not have a sickly appearance.  Neck: Normal range of motion. Neck supple.  Musculoskeletal: She exhibits tenderness.       Back:  Neurological: She is alert.  Skin: No rash noted. No erythema.    ED Course  Procedures (including critical care time)  Labs Reviewed - No data to display No results found.   1. Thoracic back pain   2.  Radiculopathy of thoracic region       MDM  Non-trauma, thoracic pain with radiculopathy pattern of pain. Patient has conserve muscular strength to her right upper extremity. Have prescribed her a course of meloxicam and name Flexeril have encouraged her to followup with her primary care doctor pain was to persist. I do not believe she has just symptomatology suggestive of a spinal cord injury or an infectious spinal process, have reviewed some of her previous imaging studies patient does have degenerative changes in her thoracic and lumbar spine. Have discussed with patient importance of followup with her primary care doctor she agrees with treatment plan follow-up care.      Jimmie Molly, MD 04/13/12 [phone]

## 2012-04-13 NOTE — ED Notes (Signed)
Pt here with c/o upper right back pain that worsens with head movement or bending x5 days unrelieved by otc advil,tylenol and heating pad.states has hx lower back pain.also c/o right arm numbness intermit but denies CP.HX HTN.noncompliant with meds

## 2013-01-13 ENCOUNTER — Encounter (HOSPITAL_COMMUNITY): Payer: Self-pay | Admitting: Emergency Medicine

## 2013-01-13 ENCOUNTER — Emergency Department (HOSPITAL_COMMUNITY)
Admission: EM | Admit: 2013-01-13 | Discharge: 2013-01-13 | Disposition: A | Discharge status: Home / Self Care | Payer: Medicaid Other | Attending: Emergency Medicine | Admitting: Emergency Medicine

## 2013-01-13 ENCOUNTER — Emergency Department (HOSPITAL_COMMUNITY): Payer: Medicaid Other

## 2013-01-13 DIAGNOSIS — I10 Essential (primary) hypertension: Secondary | ICD-10-CM | POA: Insufficient documentation

## 2013-01-13 DIAGNOSIS — F319 Bipolar disorder, unspecified: Secondary | ICD-10-CM | POA: Insufficient documentation

## 2013-01-13 DIAGNOSIS — R42 Dizziness and giddiness: Secondary | ICD-10-CM | POA: Insufficient documentation

## 2013-01-13 DIAGNOSIS — M545 Low back pain, unspecified: Secondary | ICD-10-CM | POA: Insufficient documentation

## 2013-01-13 DIAGNOSIS — R110 Nausea: Secondary | ICD-10-CM | POA: Insufficient documentation

## 2013-01-13 DIAGNOSIS — M542 Cervicalgia: Secondary | ICD-10-CM | POA: Insufficient documentation

## 2013-01-13 DIAGNOSIS — I209 Angina pectoris, unspecified: Secondary | ICD-10-CM | POA: Insufficient documentation

## 2013-01-13 DIAGNOSIS — Z79899 Other long term (current) drug therapy: Secondary | ICD-10-CM | POA: Insufficient documentation

## 2013-01-13 DIAGNOSIS — E669 Obesity, unspecified: Secondary | ICD-10-CM | POA: Insufficient documentation

## 2013-01-13 DIAGNOSIS — M76899 Other specified enthesopathies of unspecified lower limb, excluding foot: Secondary | ICD-10-CM | POA: Insufficient documentation

## 2013-01-13 DIAGNOSIS — G8929 Other chronic pain: Secondary | ICD-10-CM | POA: Insufficient documentation

## 2013-01-13 DIAGNOSIS — F17200 Nicotine dependence, unspecified, uncomplicated: Secondary | ICD-10-CM | POA: Insufficient documentation

## 2013-01-13 DIAGNOSIS — F411 Generalized anxiety disorder: Secondary | ICD-10-CM | POA: Insufficient documentation

## 2013-01-13 DIAGNOSIS — H938X9 Other specified disorders of ear, unspecified ear: Secondary | ICD-10-CM | POA: Insufficient documentation

## 2013-01-13 LAB — CBC WITH DIFFERENTIAL/PLATELET
Basophils Absolute: 0 10*3/uL (ref 0.0–0.1)
Eosinophils Absolute: 0.1 10*3/uL (ref 0.0–0.7)
Eosinophils Relative: 1 % (ref 0–5)
HCT: 46.8 % — ABNORMAL HIGH (ref 36.0–46.0)
MCH: 32.6 pg (ref 26.0–34.0)
MCV: 88.3 fL (ref 78.0–100.0)
Monocytes Absolute: 0.7 10*3/uL (ref 0.1–1.0)
Platelets: 230 10*3/uL (ref 150–400)
RDW: 13.4 % (ref 11.5–15.5)

## 2013-01-13 LAB — COMPREHENSIVE METABOLIC PANEL
ALT: 20 U/L (ref 0–35)
AST: 24 U/L (ref 0–37)
Calcium: 9.9 mg/dL (ref 8.4–10.5)
Creatinine, Ser: 0.58 mg/dL (ref 0.50–1.10)
GFR calc Af Amer: >90 mL/min (ref >90)
GFR calc non Af Amer: >90 mL/min (ref >90)
Glucose, Bld: 133 mg/dL — ABNORMAL HIGH (ref 70–99)
Sodium: 138 mEq/L (ref 135–145)
Total Protein: 7.2 g/dL (ref 6.0–8.3)

## 2013-01-13 MED ORDER — MECLIZINE HCL 25 MG PO TABS: 25.0000 mg | ORAL_TABLET | Freq: Three times a day (TID) | ORAL | Status: DC | PRN

## 2013-01-13 MED ORDER — POTASSIUM CHLORIDE CRYS ER 20 MEQ PO TBCR
40.0000 meq | EXTENDED_RELEASE_TABLET | Freq: Once | ORAL | Status: AC
  Administered 2013-01-13: 40 meq via ORAL
  Filled 2013-01-13: qty 2

## 2013-01-13 MED ORDER — MECLIZINE HCL 25 MG PO TABS
25.0000 mg | ORAL_TABLET | Freq: Once | ORAL | Status: AC
  Administered 2013-01-13: 25 mg via ORAL
  Filled 2013-01-13: qty 1

## 2013-01-13 NOTE — ED Provider Notes (Signed)
CSN: [id number]     Arrival date & time 01/13/13  0058 History     First MD Initiated Contact with Patient 01/13/13 [phone]     Chief Complaint  Patient presents with  . Dizziness   (Consider location/radiation/quality/duration/timing/severity/associated sxs/prior Treatment) HPI Comments: Patient is a 57 yo F presenting to the ED for three days of dizziness with associated bilateral ear congestion and nausea. Patient states it feels like she is "swimming around." Patient states her dizziness is made worse with movement and improved with laying still. Patient denies any other aggravating or alleviating factors. Patient states she has had previous episodes similar to this. Patient denies any fevers, chills, CP, SOB, abdominal pain, vomiting , numbness or weakness.    Past Medical History  Diagnosis Date  . Obesity   . Hypertension   . Anxiety   . Bipolar 1 disorder   . Angina   . Cervicalgia   . Lumbago   . Enthesopathy of hip region   . Pain in joint, hand   . Disturbance of skin sensation   . Chronic pain syndrome    Past Surgical History  Procedure Laterality Date  . Cesarean section    . Knee arthroplasty    . Tonsillectomy     Family History  Problem Relation Age of Onset  . Alcohol abuse Mother   . Alcohol abuse Father   . Alcohol abuse Sister   . Alcohol abuse Brother    History  Substance Use Topics  . Smoking status: Current Every Day Smoker -- 0.50 packs/day for 40 years    Types: Cigarettes  . Smokeless tobacco: Never Used  . Alcohol Use: Yes     Comment: occassionally    OB History   Grav Para Term Preterm Abortions TAB SAB Ect Mult Living   4 4 4       4      Review of Systems  Constitutional: Negative for fever.  HENT: Negative.   Eyes: Negative for pain.  Respiratory: Negative for shortness of breath.   Cardiovascular: Negative for chest pain.  Gastrointestinal: Positive for nausea. Negative for vomiting and abdominal pain.  Genitourinary:  Negative.   Musculoskeletal: Negative.   Skin: Negative.   Neurological: Positive for dizziness. Negative for syncope and speech difficulty.    Allergies  Review of patient's allergies indicates no known allergies.  Home Medications   Current Outpatient Rx  Name  Route  Sig  Dispense  Refill  . furosemide (LASIX) 40 MG tablet   Oral   Take 40 mg by mouth daily.         . hydrochlorothiazide (HYDRODIURIL) 25 MG tablet   Oral   Take 25 mg by mouth daily.         . Melatonin 5 MG CAPS   Oral   Take 10 mg by mouth at bedtime.         . potassium chloride SA (K-DUR,KLOR-CON) 20 MEQ tablet   Oral   Take 20 mEq by mouth daily.         . meclizine (ANTIVERT) 25 MG tablet   Oral   Take 1 tablet (25 mg total) by mouth 3 (three) times daily as needed.   30 tablet   0    BP 161/98  Pulse 65  Temp(Src) 97.2 F (36.2 C) (Oral)  Resp 16  SpO2 98%  LMP 08/13/2011 Physical Exam  Constitutional: She is oriented to person, place, and time. She appears well-developed and well-nourished. No  distress.  HENT:  Head: Normocephalic and atraumatic.  Right Ear: External ear normal.  Left Ear: External ear normal.  Nose: Nose normal.  Mouth/Throat: Oropharynx is clear and moist.  Eyes: Conjunctivae and EOM are normal. Pupils are equal, round, and reactive to light.  Neck: Normal range of motion. Neck supple.  Cardiovascular: Normal rate, regular rhythm, normal heart sounds and intact distal pulses.   Pulmonary/Chest: Effort normal and breath sounds normal.  Abdominal: Soft. There is no tenderness. There is no rebound and no guarding.  Neurological: She is alert and oriented to person, place, and time. She has normal strength. No cranial nerve deficit or sensory deficit. She displays a negative Romberg sign. Coordination and gait normal. GCS eye subscore is 4. GCS verbal subscore is 5. GCS motor subscore is 6.  No pronator drift.   Skin: Skin is warm and dry. She is not  diaphoretic. No erythema.    ED Course   Procedures (including critical care time)  Medications  meclizine (ANTIVERT) tablet 25 mg (25 mg Oral Given 01/13/13 0358)  potassium chloride SA (K-DUR,KLOR-CON) CR tablet 40 mEq (40 mEq Oral Given 01/13/13 0358)     Labs Reviewed  CBC WITH DIFFERENTIAL - Abnormal; Notable for the following:    WBC 10.6 (*)    RBC 5.30 (*)    Hemoglobin 17.3 (*)    HCT 46.8 (*)    MCHC 37.0 (*)    All other components within normal limits  COMPREHENSIVE METABOLIC PANEL - Abnormal; Notable for the following:    Potassium 2.8 (*)    Chloride 94 (*)    CO2 33 (*)    Glucose, Bld 133 (*)    All other components within normal limits   Ct Head Wo Contrast  01/13/2013   *RADIOLOGY REPORT*  Clinical Data: Dizziness.  Pain at the base of the skull.  CT HEAD WITHOUT CONTRAST  Technique:  Contiguous axial images were obtained from the base of the skull through the vertex without contrast.  Comparison: None.  Findings: The ventricles and sulci are symmetrical without significant effacement, displacement, or dilatation. No mass effect or midline shift. No abnormal extra-axial fluid collections. The grey-white matter junction is distinct. Basal cisterns are not effaced. No acute intracranial hemorrhage. No depressed skull fractures.  Visualized paranasal sinuses and mastoid air cells are not opacified.  IMPRESSION: No acute intracranial abnormalities.   Original Report Authenticated By: Burman Nieves, M.D.   1. Vertigo     MDM  Afebrile, NAD, non-toxic appearing, AAOx4. Pt with history of vertigo presenting with dizziness and nausea similar to previous episodes of vertigo. No neuro focal deficits on examination. Patient is able to ambulate. Negative Romberg and no pronator drift. I have Personally reviewed all laboratory findings, vital signs, nursing notes, imaging. Patient given potassium replacement. Dizziness improved after meclizine administration. Based on history  of vertigo, no neuro focal deficits on examination, negative CT scan, improvement after meclizine no concern for acute intracranial cause of dizziness, most likely benign positional vertigo like previous episodes patient has had. Return precautions discussed. Patient will be sent home with meclizine prescription. Advised PCP followup. Patient agreed to plan. Patient is stable at time of discharge. Patient d/w with Dr. Nicanor Alcon, agrees with plan.      Jeannetta Ellis, PA-C 01/13/13 2127

## 2013-01-13 NOTE — ED Notes (Signed)
Patient with history of vertigo, having dizziness that started three days ago and progressively getting worse.  Patient does have some nausea, no vomiting.

## 2013-01-15 NOTE — ED Provider Notes (Signed)
Medical screening examination/treatment/procedure(s) were performed by non-physician practitioner and as supervising physician I was immediately available for consultation/collaboration.  Jasmine Awe, MD 01/15/13 [phone]

## 2014-03-11 ENCOUNTER — Ambulatory Visit: Payer: Medicaid Other | Attending: Anesthesiology | Admitting: Physical Therapy

## 2014-03-11 DIAGNOSIS — M545 Low back pain: Secondary | ICD-10-CM | POA: Diagnosis not present

## 2014-03-11 DIAGNOSIS — M62830 Muscle spasm of back: Secondary | ICD-10-CM | POA: Diagnosis not present

## 2014-03-25 ENCOUNTER — Encounter (HOSPITAL_COMMUNITY): Payer: Self-pay | Admitting: Emergency Medicine

## 2015-01-01 ENCOUNTER — Encounter (HOSPITAL_COMMUNITY): Payer: Self-pay | Admitting: Emergency Medicine

## 2015-01-01 DIAGNOSIS — M79672 Pain in left foot: Secondary | ICD-10-CM | POA: Insufficient documentation

## 2015-01-01 DIAGNOSIS — Z8659 Personal history of other mental and behavioral disorders: Secondary | ICD-10-CM | POA: Insufficient documentation

## 2015-01-01 DIAGNOSIS — E669 Obesity, unspecified: Secondary | ICD-10-CM | POA: Diagnosis not present

## 2015-01-01 DIAGNOSIS — E876 Hypokalemia: Secondary | ICD-10-CM | POA: Insufficient documentation

## 2015-01-01 DIAGNOSIS — R000 Tachycardia, unspecified: Secondary | ICD-10-CM | POA: Insufficient documentation

## 2015-01-01 DIAGNOSIS — Z720 Tobacco use: Secondary | ICD-10-CM | POA: Insufficient documentation

## 2015-01-01 DIAGNOSIS — I10 Essential (primary) hypertension: Secondary | ICD-10-CM | POA: Insufficient documentation

## 2015-01-01 DIAGNOSIS — G894 Chronic pain syndrome: Secondary | ICD-10-CM | POA: Insufficient documentation

## 2015-01-01 NOTE — ED Notes (Signed)
Pt. woke up this morning with left foot pain/bruise denies injury / ambulatory .

## 2015-01-02 ENCOUNTER — Emergency Department (HOSPITAL_COMMUNITY): Payer: Medicaid Other

## 2015-01-02 ENCOUNTER — Emergency Department (HOSPITAL_COMMUNITY)
Admission: EM | Admit: 2015-01-02 | Discharge: 2015-01-02 | Disposition: A | Discharge status: Home / Self Care | Payer: Medicaid Other | Attending: Emergency Medicine | Admitting: Emergency Medicine

## 2015-01-02 DIAGNOSIS — E876 Hypokalemia: Secondary | ICD-10-CM

## 2015-01-02 DIAGNOSIS — M79672 Pain in left foot: Secondary | ICD-10-CM

## 2015-01-02 LAB — CBC WITH DIFFERENTIAL/PLATELET
Basophils Absolute: 0 10*3/uL (ref 0.0–0.1)
Basophils Relative: 0 % (ref 0–1)
Eosinophils Absolute: 0.1 10*3/uL (ref 0.0–0.7)
Eosinophils Relative: 1 % (ref 0–5)
HCT: 43.9 % (ref 36.0–46.0)
Hemoglobin: 15.5 g/dL — ABNORMAL HIGH (ref 12.0–15.0)
LYMPHS ABS: 2.8 10*3/uL (ref 0.7–4.0)
LYMPHS PCT: 28 % (ref 12–46)
MCH: 31.1 pg (ref 26.0–34.0)
MCHC: 35.3 g/dL (ref 30.0–36.0)
MCV: 88 fL (ref 78.0–100.0)
Monocytes Absolute: 0.9 10*3/uL (ref 0.1–1.0)
Monocytes Relative: 9 % (ref 3–12)
NEUTROS ABS: 6.1 10*3/uL (ref 1.7–7.7)
NEUTROS PCT: 62 % (ref 43–77)
PLATELETS: 202 10*3/uL (ref 150–400)
RBC: 4.99 MIL/uL (ref 3.87–5.11)
RDW: 13 % (ref 11.5–15.5)
WBC: 9.9 10*3/uL (ref 4.0–10.5)

## 2015-01-02 LAB — I-STAT CHEM 8, ED
BUN: 17 mg/dL (ref 6–20)
CALCIUM ION: 1.16 mmol/L (ref 1.12–1.23)
CREATININE: 0.7 mg/dL (ref 0.44–1.00)
Chloride: 100 mmol/L — ABNORMAL LOW (ref 101–111)
GLUCOSE: 137 mg/dL — AB (ref 65–99)
HEMATOCRIT: 44 % (ref 36.0–46.0)
Hemoglobin: 15 g/dL (ref 12.0–15.0)
Potassium: 3 mmol/L — ABNORMAL LOW (ref 3.5–5.1)
Sodium: 140 mmol/L (ref 135–145)
TCO2: 25 mmol/L (ref 0–100)

## 2015-01-02 MED ORDER — POTASSIUM CHLORIDE CRYS ER 20 MEQ PO TBCR: 40.0000 meq | EXTENDED_RELEASE_TABLET | Freq: Every day | ORAL | Status: DC

## 2015-01-02 MED ORDER — POTASSIUM CHLORIDE CRYS ER 20 MEQ PO TBCR
40.0000 meq | EXTENDED_RELEASE_TABLET | Freq: Once | ORAL | Status: AC
  Administered 2015-01-02: 40 meq via ORAL
  Filled 2015-01-02: qty 2

## 2015-01-02 MED ORDER — KETOROLAC TROMETHAMINE 30 MG/ML IJ SOLN
30.0000 mg | Freq: Once | INTRAMUSCULAR | Status: AC
  Administered 2015-01-02: 30 mg via INTRAMUSCULAR
  Filled 2015-01-02: qty 1

## 2015-01-02 MED ORDER — HYDROCODONE-ACETAMINOPHEN 5-325 MG PO TABS: 2.0000 | ORAL_TABLET | ORAL | Status: DC | PRN

## 2015-01-02 NOTE — Discharge Instructions (Signed)
Your xray is normal you have no sign of infection But you do have a low potassium and I added extra doses fro the next several days Please try to add more potassium in your diet Follow up with your PCP

## 2015-01-02 NOTE — ED Provider Notes (Signed)
CSN: [id number]     Arrival date & time 01/01/15  2314 History   First MD Initiated Contact with Patient 01/02/15 0049     Chief Complaint  Patient presents with  . Foot Pain     (Consider location/radiation/quality/duration/timing/severity/associated sxs/prior Treatment) HPI Comments: Patient states she woke up this morning with the discoloration to the lateral portion of her upper left foot that is been painful throughout the day.  She denies any trauma.  She does wear flip-flops with the band that crosses this area, but states that she did not sleep in the shoes and when she went to bed last night she had no discoloration.  She has tried taking her oral narcotics that she has for chronic pain without any relief of the discomfort in her foot. She states she tried sleeping for 2 hours and it was throbbing, some on to that she couldn't sleep and came to the emergency department for evaluation  Patient is a 59 y.o. female presenting with lower extremity pain. The history is provided by the patient.  Foot Pain This is a new problem. The current episode started today. The problem occurs constantly. The problem has been unchanged. Pertinent negatives include no chills, fever, joint swelling, rash or weakness. The symptoms are aggravated by walking. She has tried oral narcotics for the symptoms. The treatment provided no relief.    Past Medical History  Diagnosis Date  . Obesity   . Hypertension   . Anxiety   . Bipolar 1 disorder   . Angina   . Cervicalgia   . Lumbago   . Enthesopathy of hip region   . Pain in joint, hand   . Disturbance of skin sensation   . Chronic pain syndrome    Past Surgical History  Procedure Laterality Date  . Cesarean section    . Knee arthroplasty    . Tonsillectomy     Family History  Problem Relation Age of Onset  . Alcohol abuse Mother   . Alcohol abuse Father   . Alcohol abuse Sister   . Alcohol abuse Brother    Social History  Substance Use  Topics  . Smoking status: Current Every Day Smoker -- 0.00 packs/day for 40 years    Types: Cigarettes  . Smokeless tobacco: Never Used  . Alcohol Use: Yes   OB History    Gravida Para Term Preterm AB TAB SAB Ectopic Multiple Living   4 4 4       4      Review of Systems  Constitutional: Negative for fever and chills.  Musculoskeletal: Negative for joint swelling.  Skin: Positive for color change. Negative for rash.  Neurological: Negative for weakness.  All other systems reviewed and are negative.     Allergies  Review of patient's allergies indicates no known allergies.  Home Medications   Prior to Admission medications   Medication Sig Start Date End Date Taking? Authorizing Provider  furosemide (LASIX) 40 MG tablet Take 40 mg by mouth daily.    Historical Provider, MD  hydrochlorothiazide (HYDRODIURIL) 25 MG tablet Take 25 mg by mouth daily.    Historical Provider, MD  HYDROcodone-acetaminophen (NORCO/VICODIN) 5-325 MG per tablet Take 2 tablets by mouth every 4 (four) hours as needed. 01/02/15   Junius Creamer, NP  meclizine (ANTIVERT) 25 MG tablet Take 1 tablet (25 mg total) by mouth 3 (three) times daily as needed. 01/13/13   Baron Sane, PA-C  Melatonin 5 MG CAPS Take 10 mg by mouth  at bedtime.    Historical Provider, MD  potassium chloride SA (K-DUR,KLOR-CON) 20 MEQ tablet Take 2 tablets (40 mEq total) by mouth daily. 01/02/15   Junius Creamer, NP   BP 142/84 mmHg  Pulse 107  Temp(Src) 98 F (36.7 C) (Oral)  Resp 22  SpO2 96%  LMP 08/13/2011 Physical Exam  Constitutional: She appears well-developed and well-nourished. No distress.  HENT:  Head: Normocephalic.  Eyes: Pupils are equal, round, and reactive to light.  Neck: Normal range of motion.  Cardiovascular: Regular rhythm.  Tachycardia present.   Pulmonary/Chest: Effort normal.  Abdominal: Soft.  Musculoskeletal: She exhibits tenderness. She exhibits no edema.       Feet:  Neurological: She is alert.    Skin: Skin is warm. No rash noted. No erythema.  Nursing note and vitals reviewed.   ED Course  Procedures (including critical care time) Labs Review Labs Reviewed  CBC WITH DIFFERENTIAL/PLATELET - Abnormal; Notable for the following:    Hemoglobin 15.5 (*)    All other components within normal limits  I-STAT CHEM 8, ED - Abnormal; Notable for the following:    Potassium 3.0 (*)    Chloride 100 (*)    Glucose, Bld 137 (*)    All other components within normal limits    Imaging Review Dg Foot Complete Left  01/02/2015   CLINICAL DATA:  Pain and swelling.  No known trauma  EXAM: LEFT FOOT - COMPLETE 3+ VIEW  COMPARISON:  August 23, 2010  FINDINGS: Frontal, oblique, and lateral views obtained. There is no fracture or dislocation. Joint spaces appear intact. No erosive change. There is a small spur arising from the posterior calcaneus. No soft tissue mass or calcification.  IMPRESSION: No fracture or dislocation. No appreciable arthropathy. Posterior calcaneal spur.   Electronically Signed   By: Lowella Grip III M.D.   On: 01/02/2015 01:25     EKG Interpretation None      MDM   Final diagnoses:  Foot pain, left  Hypokalemia         Junius Creamer, NP 01/02/15 5329  Varney Biles, MD 01/02/15 2348

## 2015-01-17 ENCOUNTER — Emergency Department (HOSPITAL_COMMUNITY): Payer: No Typology Code available for payment source

## 2015-01-17 ENCOUNTER — Encounter (HOSPITAL_COMMUNITY): Payer: Self-pay | Admitting: Emergency Medicine

## 2015-01-17 ENCOUNTER — Emergency Department (HOSPITAL_COMMUNITY)
Admission: EM | Admit: 2015-01-17 | Discharge: 2015-01-17 | Disposition: A | Discharge status: Home / Self Care | Payer: No Typology Code available for payment source | Attending: Emergency Medicine | Admitting: Emergency Medicine

## 2015-01-17 DIAGNOSIS — Z720 Tobacco use: Secondary | ICD-10-CM | POA: Diagnosis not present

## 2015-01-17 DIAGNOSIS — Y998 Other external cause status: Secondary | ICD-10-CM | POA: Diagnosis not present

## 2015-01-17 DIAGNOSIS — I10 Essential (primary) hypertension: Secondary | ICD-10-CM | POA: Insufficient documentation

## 2015-01-17 DIAGNOSIS — Y92410 Unspecified street and highway as the place of occurrence of the external cause: Secondary | ICD-10-CM | POA: Insufficient documentation

## 2015-01-17 DIAGNOSIS — Z8659 Personal history of other mental and behavioral disorders: Secondary | ICD-10-CM | POA: Insufficient documentation

## 2015-01-17 DIAGNOSIS — E119 Type 2 diabetes mellitus without complications: Secondary | ICD-10-CM | POA: Insufficient documentation

## 2015-01-17 DIAGNOSIS — S20212A Contusion of left front wall of thorax, initial encounter: Secondary | ICD-10-CM | POA: Diagnosis not present

## 2015-01-17 DIAGNOSIS — I209 Angina pectoris, unspecified: Secondary | ICD-10-CM | POA: Insufficient documentation

## 2015-01-17 DIAGNOSIS — G894 Chronic pain syndrome: Secondary | ICD-10-CM | POA: Insufficient documentation

## 2015-01-17 DIAGNOSIS — S299XXA Unspecified injury of thorax, initial encounter: Secondary | ICD-10-CM | POA: Diagnosis present

## 2015-01-17 DIAGNOSIS — S29012A Strain of muscle and tendon of back wall of thorax, initial encounter: Secondary | ICD-10-CM | POA: Insufficient documentation

## 2015-01-17 DIAGNOSIS — Z79899 Other long term (current) drug therapy: Secondary | ICD-10-CM | POA: Insufficient documentation

## 2015-01-17 DIAGNOSIS — Y9389 Activity, other specified: Secondary | ICD-10-CM | POA: Diagnosis not present

## 2015-01-17 DIAGNOSIS — T148XXA Other injury of unspecified body region, initial encounter: Secondary | ICD-10-CM

## 2015-01-17 MED ORDER — HYDROMORPHONE HCL 1 MG/ML IJ SOLN
1.0000 mg | Freq: Once | INTRAMUSCULAR | Status: AC
Start: 2015-01-17 — End: 2015-01-17
  Administered 2015-01-17: 1 mg via INTRAMUSCULAR

## 2015-01-17 MED ORDER — KETOROLAC TROMETHAMINE 60 MG/2ML IM SOLN: 60.0000 mg | Freq: Once | INTRAMUSCULAR | Status: DC

## 2015-01-17 MED ORDER — HYDROMORPHONE HCL 1 MG/ML IJ SOLN
1.0000 mg | Freq: Once | INTRAMUSCULAR | Status: DC
  Filled 2015-01-17: qty 1
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
## 2015-01-22 ENCOUNTER — Encounter (HOSPITAL_COMMUNITY): Payer: Self-pay

## 2015-01-22 ENCOUNTER — Emergency Department (HOSPITAL_COMMUNITY)
Admission: EM | Admit: 2015-01-22 | Discharge: 2015-01-22 | Disposition: A | Discharge status: Home / Self Care | Payer: No Typology Code available for payment source | Attending: Emergency Medicine | Admitting: Emergency Medicine

## 2015-01-22 DIAGNOSIS — M545 Low back pain: Secondary | ICD-10-CM | POA: Diagnosis present

## 2015-01-22 DIAGNOSIS — M62830 Muscle spasm of back: Secondary | ICD-10-CM | POA: Diagnosis not present

## 2015-01-22 DIAGNOSIS — M791 Myalgia, unspecified site: Secondary | ICD-10-CM

## 2015-01-22 DIAGNOSIS — R2240 Localized swelling, mass and lump, unspecified lower limb: Secondary | ICD-10-CM | POA: Insufficient documentation

## 2015-01-22 DIAGNOSIS — G894 Chronic pain syndrome: Secondary | ICD-10-CM | POA: Diagnosis not present

## 2015-01-22 DIAGNOSIS — Z79899 Other long term (current) drug therapy: Secondary | ICD-10-CM | POA: Insufficient documentation

## 2015-01-22 DIAGNOSIS — I10 Essential (primary) hypertension: Secondary | ICD-10-CM | POA: Insufficient documentation

## 2015-01-22 DIAGNOSIS — Z720 Tobacco use: Secondary | ICD-10-CM | POA: Diagnosis not present

## 2015-01-22 DIAGNOSIS — Z8659 Personal history of other mental and behavioral disorders: Secondary | ICD-10-CM | POA: Diagnosis not present

## 2015-01-22 DIAGNOSIS — R0600 Dyspnea, unspecified: Secondary | ICD-10-CM | POA: Diagnosis not present

## 2015-01-22 DIAGNOSIS — E669 Obesity, unspecified: Secondary | ICD-10-CM | POA: Insufficient documentation

## 2015-01-22 MED ORDER — LIDOCAINE HCL (PF) 0.5 % IJ SOLN
20.0000 mL | Freq: Once | INTRAMUSCULAR | Status: DC
  Filled 2015-01-22: qty 50

## 2015-01-22 MED ORDER — LIDOCAINE 5 % EX PTCH: 1.0000 | MEDICATED_PATCH | CUTANEOUS | Status: DC

## 2015-01-22 MED ORDER — LIDOCAINE HCL 0.5 % IJ SOLN
20.0000 mL | Freq: Once | INTRAMUSCULAR | Status: DC
  Filled 2015-01-22: qty 20

## 2015-01-22 MED ORDER — NAPROXEN 500 MG PO TABS: 500.0000 mg | ORAL_TABLET | Freq: Two times a day (BID) | ORAL | Status: DC

## 2015-01-22 MED ORDER — LIDOCAINE HCL (PF) 0.5 % IJ SOLN
50.0000 mL | Freq: Once | INTRAMUSCULAR | Status: AC
  Administered 2015-01-22: 50 mL
  Filled 2015-01-22: qty 50
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
## 2015-01-22 NOTE — Discharge Instructions (Signed)
Your insurance may not cover the lidoderm 5% patches, if it doesn't you may purchase 4% LIDOCAINE PATCHES over the counter. SEEK IMMEDIATE MEDICAL ATTENTION IF: New numbness, tingling, weakness, or problem with the use of your arms or legs.  Severe back pain not relieved with medications.  Change in bowel or bladder control.  Increasing pain in any areas of the body (such as chest or abdominal pain).  Shortness of breath, dizziness or fainting.  Nausea (feeling sick to your stomach), vomiting, fever, or sweats.   Trigger Point Injection Trigger points are areas where you have muscle pain. A trigger point injection is a shot given in the trigger point to relieve that pain. A trigger point might feel like a knot in your muscle. It hurts to press on a trigger point. Sometimes the pain spreads out (radiates) to other parts of the body. For example, pressing on a trigger point in your shoulder might cause pain in your arm or neck. You might have one trigger point. Or, you might have more than one. People often have trigger points in their upper back and lower back. They also occur often in the neck and shoulders. Pain from a trigger point lasts for a long time. It can make it hard to keep moving. You might not be able to do the exercise or physical therapy that could help you deal with the pain. A trigger point injection may help. It does not work for everyone. But, it may relieve your pain for a few days or a few months. A trigger point injection does not cure long-lasting (chronic) pain. LET YOUR CAREGIVER KNOW ABOUT:  Any allergies (especially to latex, lidocaine, or steroids).  Blood-thinning medicines that you take. These drugs can lead to bleeding or bruising after an injection. They include:  Aspirin.  Ibuprofen.  Clopidogrel.  Warfarin.  Other medicines you take. This includes all vitamins, herbs, eyedrops, over-the-counter medicines, and creams.  Use of steroids.  Recent  infections.  Past problems with numbing medicines.  Bleeding problems.  Surgeries you have had.  Other health problems. RISKS AND COMPLICATIONS A trigger point injection is a safe treatment. However, problems may develop, such as:  Minor side effects usually go away in 1 to 2 days. These may include:  Soreness.  Bruising.  Stiffness.  More serious problems are rare. But, they may include:  Bleeding under the skin (hematoma).  Skin infection.  Breaking off of the needle under your skin.  Lung puncture.  The trigger point injection may not work for you. BEFORE THE PROCEDURE You may need to stop taking any medicine that thins your blood. This is to prevent bleeding and bruising. Usually these medicines are stopped several days before the injection. No other preparation is needed. PROCEDURE  A trigger point injection can be given in your caregiver's office or in a clinic. Each injection takes 2 minutes or less.  Your caregiver will feel for trigger points. The caregiver may use a marker to circle the area for the injection.  The skin over the trigger point will be washed with a germ-killing (antiseptic) solution.  The caregiver pinches the spot for the injection.  Then, a very thin needle is used for the shot. You may feel pain or a twitching feeling when the needle enters the trigger point.  A numbing solution may be injected into the trigger point. Sometimes a drug to keep down swelling, redness, and warmth (inflammation) is also injected.  Your caregiver moves the needle around  the trigger zone until the tightness and twitching goes away.  After the injection, your caregiver may put gentle pressure over the injection site.  Then it is covered with a bandage. AFTER THE PROCEDURE  You can go right home after the injection.  The bandage can be taken off after a few hours.  You may feel sore and stiff for 1 to 2 days.  Go back to your regular activities slowly.  Your caregiver may ask you to stretch your muscles. Do not do anything that takes extra energy for a few days.  Follow your caregiver's instructions to manage and treat other pain. Document Released: 04/29/2011 Document Revised: 09/04/2012 Document Reviewed: 04/29/2011 [facility] Patient Information 2015 [place], [place]. This information is not intended to replace advice given to you by your health care provider. Make sure you discuss any questions you have with your health care provider.  Musculoskeletal Pain Musculoskeletal pain is muscle and boney aches and pains. These pains can occur in any part of the body. Your caregiver may treat you without knowing the cause of the pain. They may treat you if blood or urine tests, X-rays, and other tests were normal.  CAUSES There is often not a definite cause or reason for these pains. These pains may be caused by a type of germ (virus). The discomfort may also come from overuse. Overuse includes working out too hard when your body is not fit. Boney aches also come from weather changes. Bone is sensitive to atmospheric pressure changes. HOME CARE INSTRUCTIONS   Ask when your test results will be ready. Make sure you get your test results.  Only take over-the-counter or prescription medicines for pain, discomfort, or fever as directed by your caregiver. If you were given medications for your condition, do not drive, operate machinery or power tools, or sign legal documents for 24 hours. Do not drink alcohol. Do not take sleeping pills or other medications that may interfere with treatment.  Continue all activities unless the activities cause more pain. When the pain lessens, slowly resume normal activities. Gradually increase the intensity and duration of the activities or exercise.  During periods of severe pain, bed rest may be helpful. Lay or sit in any position that is comfortable.  Putting ice on the injured area.  Put ice in a bag.  Place a  towel between your skin and the bag.  Leave the ice on for 15 to 20 minutes, 3 to 4 times a day.  Follow up with your caregiver for continued problems and no reason can be found for the pain. If the pain becomes worse or does not go away, it may be necessary to repeat tests or do additional testing. Your caregiver may need to look further for a possible cause. SEEK IMMEDIATE MEDICAL CARE IF:  You have pain that is getting worse and is not relieved by medications.  You develop chest pain that is associated with shortness or breath, sweating, feeling sick to your stomach (nauseous), or throw up (vomit).  Your pain becomes localized to the abdomen.  You develop any new symptoms that seem different or that concern you. MAKE SURE YOU:   Understand these instructions.  Will watch your condition.  Will get help right away if you are not doing well or get worse. Document Released: 05/10/2005 Document Revised: 08/02/2011 Document Reviewed: 01/12/2013 [facility] Patient Information 2015 [place], [place]. This information is not intended to replace advice given to you by your health care provider. Make sure you  discuss any questions you have with your health care provider.  Heat Therapy Heat therapy can help ease sore, stiff, injured, and tight muscles and joints. Heat relaxes your muscles, which may help ease your pain.  RISKS AND COMPLICATIONS If you have any of the following conditions, do not use heat therapy unless your health care provider has approved:  Poor circulation.  Healing wounds or scarred skin in the area being treated.  Diabetes, heart disease, or high blood pressure.  Not being able to feel (numbness) the area being treated.  Unusual swelling of the area being treated.  Active infections.  Blood clots.  Cancer.  Inability to communicate pain. This may include young children and people who have problems with their brain function (dementia).  Pregnancy. Heat therapy  should only be used on old, pre-existing, or long-lasting (chronic) injuries. Do not use heat therapy on new injuries unless directed by your health care provider. HOW TO USE HEAT THERAPY There are several different kinds of heat therapy, including:  Moist heat pack.  Warm water bath.  Hot water bottle.  Electric heating pad.  Heated gel pack.  Heated wrap.  Electric heating pad. Use the heat therapy method suggested by your health care provider. Follow your health care provider's instructions on when and how to use heat therapy. GENERAL HEAT THERAPY RECOMMENDATIONS  Do not sleep while using heat therapy. Only use heat therapy while you are awake.  Your skin may turn pink while using heat therapy. Do not use heat therapy if your skin turns red.  Do not use heat therapy if you have new pain.  High heat or long exposure to heat can cause burns. Be careful when using heat therapy to avoid burning your skin.  Do not use heat therapy on areas of your skin that are already irritated, such as with a rash or sunburn. SEEK MEDICAL CARE IF:  You have blisters, redness, swelling, or numbness.  You have new pain.  Your pain is worse. MAKE SURE YOU:  Understand these instructions.  Will watch your condition.  Will get help right away if you are not doing well or get worse. Document Released: 08/02/2011 Document Revised: 09/24/2013 Document Reviewed: 07/03/2013 [facility] Patient Information 2015 [place], [place]. This information is not intended to replace advice given to you by your health care provider. Make sure you discuss any questions you have with your health care provider.  Muscle Cramps and Spasms Muscle cramps and spasms occur when a muscle or muscles tighten and you have no control over this tightening (involuntary muscle contraction). They are a common problem and can develop in any muscle. The most common place is in the calf muscles of the leg. Both muscle cramps and  muscle spasms are involuntary muscle contractions, but they also have differences:   Muscle cramps are sporadic and painful. They may last a few seconds to a quarter of an hour. Muscle cramps are often more forceful and last longer than muscle spasms.  Muscle spasms may or may not be painful. They may also last just a few seconds or much longer. CAUSES  It is uncommon for cramps or spasms to be due to a serious underlying problem. In many cases, the cause of cramps or spasms is unknown. Some common causes are:   Overexertion.   Overuse from repetitive motions (doing the same thing over and over).   Remaining in a certain position for a long period of time.   Improper preparation, form, or technique  while performing a sport or activity.   Dehydration.   Injury.   Side effects of some medicines.   Abnormally low levels of the salts and ions in your blood (electrolytes), especially potassium and calcium. This could happen if you are taking water pills (diuretics) or you are pregnant.  Some underlying medical problems can make it more likely to develop cramps or spasms. These include, but are not limited to:   Diabetes.   Parkinson disease.   Hormone disorders, such as thyroid problems.   Alcohol abuse.   Diseases specific to muscles, joints, and bones.   Blood vessel disease where not enough blood is getting to the muscles.  HOME CARE INSTRUCTIONS   Stay well hydrated. Drink enough water and fluids to keep your urine clear or pale yellow.  It may be helpful to massage, stretch, and relax the affected muscle.  For tight or tense muscles, use a warm towel, heating pad, or hot shower water directed to the affected area.  If you are sore or have pain after a cramp or spasm, applying ice to the affected area may relieve discomfort.  Put ice in a plastic bag.  Place a towel between your skin and the bag.  Leave the ice on for 15-20 minutes, 03-04 times a  day.  Medicines used to treat a known cause of cramps or spasms may help reduce their frequency or severity. Only take over-the-counter or prescription medicines as directed by your caregiver. SEEK MEDICAL CARE IF:  Your cramps or spasms get more severe, more frequent, or do not improve over time.  MAKE SURE YOU:   Understand these instructions.  Will watch your condition.  Will get help right away if you are not doing well or get worse. Document Released: 10/30/2001 Document Revised: 09/04/2012 Document Reviewed: 04/26/2012 [facility] Patient Information 2015 [place], [place]. This information is not intended to replace advice given to you by your health care provider. Make sure you discuss any questions you have with your health care provider.
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
## 2015-03-24 ENCOUNTER — Other Ambulatory Visit: Payer: Self-pay | Admitting: Nurse Practitioner

## 2015-07-09 ENCOUNTER — Ambulatory Visit (INDEPENDENT_AMBULATORY_CARE_PROVIDER_SITE_OTHER): Payer: Medicaid Other | Admitting: Neurology

## 2015-07-09 ENCOUNTER — Encounter: Payer: Self-pay | Admitting: Neurology

## 2015-07-09 VITALS — BP 124/68 | HR 80 | Resp 20 | Ht 65.0 in | Wt 283.0 lb

## 2015-07-09 DIAGNOSIS — F1190 Opioid use, unspecified, uncomplicated: Secondary | ICD-10-CM

## 2015-07-09 DIAGNOSIS — G8929 Other chronic pain: Secondary | ICD-10-CM | POA: Diagnosis not present

## 2015-07-09 DIAGNOSIS — G2581 Restless legs syndrome: Secondary | ICD-10-CM | POA: Diagnosis not present

## 2015-07-09 DIAGNOSIS — F17210 Nicotine dependence, cigarettes, uncomplicated: Secondary | ICD-10-CM

## 2015-07-09 DIAGNOSIS — R351 Nocturia: Secondary | ICD-10-CM | POA: Diagnosis not present

## 2015-07-09 DIAGNOSIS — G4733 Obstructive sleep apnea (adult) (pediatric): Secondary | ICD-10-CM

## 2015-07-09 DIAGNOSIS — F17200 Nicotine dependence, unspecified, uncomplicated: Secondary | ICD-10-CM

## 2015-07-09 NOTE — Progress Notes (Signed)
Subjective:    Patient ID: Madison Wells is a 60 y.o. female.  HPI     Star Age, MD, PhD [facility] [address], [address] [place], [place] [address]  Dear Doreene Burke,   I saw your patient, Sereen Schnebly, upon your kind request in my neurologic clinic today for initial consultation of her sleep disorder, in particular, concern for underlying obstructive sleep apnea. The patient is accompanied by her little granddaughter today. As you know, Ms. Wainio is a 60 year old right-handed woman with an underlying medical history of hypertension, B12 deficiency, headaches, diabetes, mood disorder, followed by psychiatry, low back pain, neck pain, on narcotic pain medication, smoking and morbid obesity, who reports snoring and excessive daytime somnolence. I reviewed your office note from 06/24/2014, which you kindly included. She previously had a sleep study may be 10 years ago and was diagnosed with obstructive sleep apnea but has not been on CPAP therapy; as I understand, CPAP was recommended at the time, but she lost insurance coverage. Prior sleep test results are not available for my review.  She reports a bedtime between 9 and 10 PM. Rise time is 7 AM. She does not wake up rested. She has rare morning headaches, nocturia 1 on an average night and also endorses restless leg symptoms and leg twitching at night. She lives with her son and 2 daughters and one grandchild but also takes care of 3 other grandchildren. She smokes a half pack per day, drinks alcohol rarely, and drinks caffeine in the form of coffee, 2 cups per day, has reduced her soda intake. She is on disability. Her Epworth sleepiness score is 8 out of 24 today, her fatigue score is 46 out of 63. Of note, she takes gabapentin 300 mg 3 times a day, Flexeril 10 mg 3 times a day, oxycodone 10 mg 3 times a day, temazepam 30 mg each night.  Her Past Medical History Is Significant For: Past Medical  History  Diagnosis Date  . Obesity   . Hypertension   . Anxiety   . Bipolar 1 disorder ([place])   . Angina   . Cervicalgia   . Lumbago   . Enthesopathy of hip region   . Pain in joint, hand   . Disturbance of skin sensation   . Chronic pain syndrome   . Dizziness and giddiness   . Headache   . Diabetes mellitus without complication [facility])     Her Past Surgical History Is Significant For: Past Surgical History  Procedure Laterality Date  . Cesarean section    . Knee arthroplasty    . Tonsillectomy      Her Family History Is Significant For: Family History  Problem Relation Age of Onset  . Alcohol abuse Mother   . Alcohol abuse Father   . Alcohol abuse Sister   . Alcohol abuse Brother     Her Social History Is Significant For: Social History   Social History  . Marital Status: Married    Spouse Name: N/A  . Number of Children: 4  . Years of Education: HS   Social History Main Topics  . Smoking status: Current Every Day Smoker -- 0.50 packs/day for 40 years    Types: Cigarettes  . Smokeless tobacco: Never Used  . Alcohol Use: No  . Drug Use: No  . Sexual Activity: Not Asked   Other Topics Concern  . None   Social History Narrative   Drinks 2 cups of coffee  a day     Her Allergies Are:  No Known Allergies:   Her Current Medications Are:  Outpatient Encounter Prescriptions as of 07/09/2015  Medication Sig  . alendronate (FOSAMAX) 70 MG tablet Take 70 mg by mouth once a week. Take with a full glass of water on an empty stomach.  . cyclobenzaprine (FLEXERIL) 10 MG tablet Take 10 mg by mouth 3 (three) times daily as needed for muscle spasms.  . diclofenac sodium (VOLTAREN) 1 % GEL Apply topically 4 (four) times daily.  . furosemide (LASIX) 40 MG tablet Take 40 mg by mouth daily.  Marland Kitchen gabapentin (NEURONTIN) 300 MG capsule Take 300 mg by mouth 3 (three) times daily.  . hydrochlorothiazide (HYDRODIURIL) 25 MG tablet Take 25 mg by mouth daily.  Marland Kitchen ibuprofen  (ADVIL,MOTRIN) 800 MG tablet Take 800 mg by mouth 3 (three) times daily.  . Melatonin 5 MG CAPS Take 10 mg by mouth at bedtime.  . metFORMIN (GLUCOPHAGE) 500 MG tablet Take by mouth 2 (two) times daily with a meal.  . Oxycodone HCl 10 MG TABS Take 10 mg by mouth as directed. Take 1 tab every 3-6 hours as needed.  . potassium chloride SA (K-DUR,KLOR-CON) 20 MEQ tablet Take 2 tablets (40 mEq total) by mouth daily.   No facility-administered encounter medications on file as of 07/09/2015.  :  Review of Systems:  Out of a complete 14 point review of systems, all are reviewed and negative with the exception of these symptoms as listed below:   Review of Systems  Constitutional: Positive for fatigue.  Cardiovascular: Positive for leg swelling.  Endocrine: Positive for polydipsia.  Neurological: Positive for weakness and numbness.       Patient had sleep study done 10+ years ago. She was prescribed CPAP at the time but never started due to loss of insurance.   Patient has trouble falling and staying asleep even with medication, wakes up feeling tired, daytime tiredness, occasionally takes naps.   Hematological: Bruises/bleeds easily.  Psychiatric/Behavioral:       Not enough sleep, disinterest in activities    Epworth Sleepiness Scale 0= would never doze 1= slight chance of dozing 2= moderate chance of dozing 3= high chance of dozing  Sitting and reading:2 Watching TV:2 Sitting inactive in a public place (ex. Theater or meeting):0 As a passenger in a car for an hour without a break:2 Lying down to rest in the afternoon:2 Sitting and talking to someone:0 Sitting quietly after lunch (no alcohol):0 In a car, while stopped in traffic:0 Total:8  Objective:  Neurologic Exam  Physical Exam Physical Examination:   Filed Vitals:   07/09/15 0857  BP: 124/68  Pulse: 80  Resp: 20    General Examination: The patient is a very pleasant 60 y.o. female in no acute distress. She appears  well-developed and well-nourished and adequately groomed.   HEENT: Normocephalic, atraumatic, pupils are equal, round and reactive to light and accommodation. Funduscopic exam is normal with sharp disc margins noted. Extraocular tracking is good without limitation to gaze excursion or nystagmus noted. Normal smooth pursuit is noted. Hearing is grossly intact. Tympanic membranes are clear bilaterally. Face is symmetric with normal facial animation and normal facial sensation. Speech is clear with no dysarthria noted. There is no hypophonia. There is no lip, neck/head, jaw or voice tremor. Neck is supple with full range of passive and active motion. There are no carotid bruits on auscultation. Oropharynx exam reveals: moderate mouth dryness, adequate dental hygiene and moderate  airway crowding, due to redundant soft palate, larger appearing tongue. Mouth is moderate to severely dry. She has full dentures in place. Mallampati is class II. Tongue protrudes centrally and palate elevates symmetrically. Tonsils are absent. Neck size is 17 inches. She has a Absent overbite.    Chest: Clear to auscultation without wheezing, rhonchi or crackles noted.  Heart: S1+S2+0, regular and normal without murmurs, rubs or gallops noted.   Abdomen: Soft, non-tender and non-distended with normal bowel sounds appreciated on auscultation.  Extremities: There is trace pitting edema around the left ankle. Pedal pulses are intact.  Skin: Warm and dry without trophic changes noted. There are no varicose veins.  Musculoskeletal: exam reveals no obvious joint deformities, tenderness or joint swelling or erythema, except for L knee pain, and L hip pain, LBP.  Neurologically:  Mental status: The patient is awake, alert and oriented in all 4 spheres. Her immediate and remote memory, attention, language skills and fund of knowledge are appropriate. There is no evidence of aphasia, agnosia, apraxia or anomia. Speech is clear with  normal prosody and enunciation. Thought process is linear. Mood is normal and affect is constricted.  Cranial nerves II - XII are as described above under HEENT exam. In addition: shoulder shrug is normal with equal shoulder height noted. Motor exam: Normal bulk, strength and tone is noted. There is no drift, tremor or rebound. Romberg is negative. Reflexes are 2+ throughout. Babinski: Toes are flexor bilaterally. Fine motor skills and coordination: intact with normal finger taps, normal hand movements, normal rapid alternating patting, normal foot taps and normal foot agility.  Cerebellar testing: No dysmetria or intention tremor on finger to nose testing. Heel to shin is unremarkable bilaterally. There is no truncal or gait ataxia.  Sensory exam: intact to light touch, pinprick, vibration, temperature sense in the upper and lower extremities, with the exception of decrease in PP and vibration in the feet.  Gait, station and balance: She stands with difficulty. No veering to one side is noted. No leaning to one side is noted. Posture is age-appropriate and stance is narrow based. Gait shows normal stride length and normal pace. No problems turning are noted. She turns en bloc. Tandem walk is rather difficult for her.  Assessment and Plan:  In summary, PAM FINNIGAN is a very pleasant 60 y.o.-year old female with an underlying medical history of hypertension, B12 deficiency, headaches, diabetes, mood disorder, followed by psychiatry, low back pain, neck pain, on narcotic pain medication, smoking and morbid obesity, whose history and physical exam are in keeping with obstructive sleep apnea (OSA). I had a long chat with the patient about my findings and the diagnosis of OSA, its prognosis and treatment options. We talked about medical treatments, surgical interventions and non-pharmacological approaches. I explained in particular the risks and ramifications of untreated moderate to severe OSA,  especially with respect to developing cardiovascular disease down the Road, including congestive heart failure, difficult to treat hypertension, cardiac arrhythmias, or stroke. Even type 2 diabetes has, in part, been linked to untreated OSA. Symptoms of untreated OSA include daytime sleepiness, memory problems, mood irritability and mood disorder such as depression and anxiety, lack of energy, as well as recurrent headaches, especially morning headaches. We talked about smoking cessation and trying to maintain a healthy lifestyle in general, as well as the importance of weight control. I encouraged the patient to eat healthy, exercise daily and keep well hydrated, to keep a scheduled bedtime and wake time routine, to not  skip any meals and eat healthy snacks in between meals. I advised the patient not to drive when feeling sleepy. I recommended the following at this time: sleep study with potential positive airway pressure titration. (We will score hypopneas at 4% and split the sleep study into diagnostic and treatment portion, if the estimated. 2 hour AHI is >15/h).   I explained the sleep test procedure to the patient and also outlined possible surgical and non-surgical treatment options of OSA, including the use of a custom-made dental device (which would require a referral to a specialist dentist or oral surgeon), upper airway surgical options, such as pillar implants, radiofrequency surgery, tongue base surgery, and UPPP (which would involve a referral to an ENT surgeon). Rarely, jaw surgery such as mandibular advancement may be considered.  I also explained the CPAP treatment option to the patient, who indicated that she would be willing to try CPAP if the need arises. I explained the importance of being compliant with PAP treatment, not only for insurance purposes but primarily to improve Her symptoms, and for the patient's long term health benefit, including to reduce Her cardiovascular risks. I  answered all her questions today and the patient was in agreement. I would like to see her back after the sleep study is completed and encouraged her to call with any interim questions, concerns, problems or updates.   Thank you very much for allowing me to participate in the care of this nice patient. If I can be of any further assistance to you please do not hesitate to call me at [phone].  Sincerely,   Star Age, MD, PhD

## 2015-07-09 NOTE — Patient Instructions (Addendum)
Based on your symptoms and your exam I believe you are at risk for obstructive sleep apnea or OSA, and I think we should proceed with a sleep study to determine whether you do or do not have OSA and how severe it is. If you have more than mild OSA, I want you to consider treatment with CPAP. Please remember, the risks and ramifications of moderate to severe obstructive sleep apnea or OSA are: Cardiovascular disease, including congestive heart failure, stroke, difficult to control hypertension, arrhythmias, and even type 2 diabetes has been linked to untreated OSA. Sleep apnea causes disruption of sleep and sleep deprivation in most cases, which, in turn, can cause recurrent headaches, problems with memory, mood, concentration, focus, and vigilance. Most people with untreated sleep apnea report excessive daytime sleepiness, which can affect their ability to drive. Please do not drive if you feel sleepy.   I will likely see you back after your sleep study to go over the test results and where to go from there. We will call you after your sleep study to advise about the results (most likely, you will hear from Beverlee Nims, my nurse) and to set up an appointment at the time, as necessary.    Our sleep lab administrative assistant, Arrie Aran will meet with you or call you to schedule your sleep study. If you don't hear back from her by next week please feel free to call her at [phone]. This is her direct line and please leave a message with your phone number to call back if you get the voicemail box. She will call back as soon as possible.   Bring all your nighttime medication for the study, including the temazepam.

## 2015-07-16 ENCOUNTER — Ambulatory Visit (INDEPENDENT_AMBULATORY_CARE_PROVIDER_SITE_OTHER): Payer: Medicaid Other | Admitting: Neurology

## 2015-07-16 DIAGNOSIS — G4733 Obstructive sleep apnea (adult) (pediatric): Secondary | ICD-10-CM | POA: Diagnosis not present

## 2015-07-16 DIAGNOSIS — G4720 Circadian rhythm sleep disorder, unspecified type: Secondary | ICD-10-CM

## 2015-07-16 DIAGNOSIS — G4761 Periodic limb movement disorder: Secondary | ICD-10-CM

## 2015-07-16 DIAGNOSIS — G4734 Idiopathic sleep related nonobstructive alveolar hypoventilation: Secondary | ICD-10-CM

## 2015-07-17 ENCOUNTER — Telehealth: Payer: Self-pay | Admitting: Neurology

## 2015-07-17 NOTE — Sleep Study (Signed)
Please see the scanned sleep study interpretation located in the procedure tab within the chart review section.   

## 2015-07-17 NOTE — Telephone Encounter (Signed)
I spoke to patient and she is aware of results and recommendations. I was able to make patient f/u appt. I will fax report to referring doctor.

## 2015-07-17 NOTE — Telephone Encounter (Signed)
Patient referred by Ms. Moore, seen by me on 07/09/15, diagnostic PSG on 07/16/15.   Please call and notify the patient that the recent sleep study did not show any significant obstructive sleep apnea, she had snoring, REM related OSA and drops in oxygen saturations. For this, it is very important, that she STOP SMOKING AND LOSE WEIGHT. Mild PLMs/leg twitching was noted.  Please inform patient that I would like to go over the details of the study during a follow up appointment. Arrange a followup appointment. Also, route or fax report to PCP and referring MD, if other than PCP.  Once you have spoken to patient, you can close this encounter.   Thanks,  Star Age, MD, PhD [facility])

## 2015-07-31 ENCOUNTER — Ambulatory Visit: Payer: Self-pay | Admitting: Neurology

## 2015-09-23 ENCOUNTER — Encounter: Payer: Self-pay | Admitting: Neurology

## 2015-09-23 ENCOUNTER — Ambulatory Visit (INDEPENDENT_AMBULATORY_CARE_PROVIDER_SITE_OTHER): Payer: Medicaid Other | Admitting: Neurology

## 2015-09-23 VITALS — BP 128/72 | HR 80 | Resp 18 | Ht 65.0 in | Wt 283.0 lb

## 2015-09-23 DIAGNOSIS — F1190 Opioid use, unspecified, uncomplicated: Secondary | ICD-10-CM

## 2015-09-23 DIAGNOSIS — G8929 Other chronic pain: Secondary | ICD-10-CM

## 2015-09-23 DIAGNOSIS — G478 Other sleep disorders: Secondary | ICD-10-CM

## 2015-09-23 DIAGNOSIS — G4730 Sleep apnea, unspecified: Secondary | ICD-10-CM

## 2015-09-23 NOTE — Progress Notes (Signed)
Subjective:    Patient ID: Madison Wells is a 60 y.o. female.  HPI     Interim history:   Madison Wells is a 60 year old right-handed woman with an underlying medical history of hypertension, B12 deficiency, headaches, diabetes, mood disorder, followed by psychiatry, low back pain and neck pain, on narcotic pain medication, smoking and morbid obesity, who presents for follow-up consultation of her sleep disturbance, after her recent sleep study. The patient is unaccompanied today. I first met her on 07/09/2015 at the request of her primary care provider, at which time the patient reported snoring and excessive daytime somnolence. I invited her back for a sleep study. She had a baseline sleep study on 07/16/2015. I went over her test results with her in detail today. Sleep efficiency was 87% with a normal sleep latency of 12 minutes and wake after sleep onset of 38 minutes with mild sleep fragmentation noted. She had an increased percentage of stage II sleep, an increased percentage of slow-wave sleep at 33.6% and a decreased percentage of REM sleep at 4.9% with a markedly prolonged REM latency of 218 minutes. She had mild PLMS with an index of 10 per hour, resulting in minimal arousals of 4.1 per hour. Mild snoring was noted. Total AHI was high normal at 4.7 per hour, rising to 43.6 per hour during REM sleep. Average oxygen saturation was 92%, nadir was 82% during REM sleep. Time below 90% saturation was 55 minutes for the night, time below 88% saturation was 5 minutes and 40 seconds for the night.  Today, 09/23/2015: She reports a lot of stress. Her husband needs surgery, 84 yo son has mental health problems, she had an increase in her oxycodone to 10 mg. She still smokes, but would be willing to try to quit, but last time she tried to quit smoking she gained a lot of weight. She was tested for COPD in the past, and was told she did not have it. She has radiating back pain. She helps take care of 2  young grandchildren. She has 6 grown children (4 biological, 2 step).   Previously:  07/09/2015: She reports snoring and excessive daytime somnolence. I reviewed your office note from 06/24/2014, which you kindly included. She previously had a sleep study may be 10 years ago and was diagnosed with obstructive sleep apnea but has not been on CPAP therapy; as I understand, CPAP was recommended at the time, but she lost insurance coverage. Prior sleep test results are not available for my review.   She reports a bedtime between 9 and 10 PM. Rise time is 7 AM. She does not wake up rested. She has rare morning headaches, nocturia 1 on an average night and also endorses restless leg symptoms and leg twitching at night. She lives with her son and 2 daughters and one grandchild but also takes care of 3 other grandchildren. She smokes a half pack per day, drinks alcohol rarely, and drinks caffeine in the form of coffee, 2 cups per day, has reduced her soda intake. She is on disability. Her Epworth sleepiness score is 8 out of 24 today, her fatigue score is 46 out of 63. Of note, she takes gabapentin 300 mg 3 times a day, Flexeril 10 mg 3 times a day, oxycodone 10 mg 3 times a day, temazepam 30 mg each night.  Her Past Medical History Is Significant For: Past Medical History  Diagnosis Date  . Obesity   . Hypertension   . Anxiety   .  Bipolar 1 disorder ([place])   . Angina   . Cervicalgia   . Lumbago   . Enthesopathy of hip region   . Pain in joint, hand   . Disturbance of skin sensation   . Chronic pain syndrome   . Dizziness and giddiness   . Headache   . Diabetes mellitus without complication [facility])     Her Past Surgical History Is Significant For: Past Surgical History  Procedure Laterality Date  . Cesarean section    . Knee arthroplasty    . Tonsillectomy      Her Family History Is Significant For: Family History  Problem Relation Age of Onset  . Alcohol abuse Mother   . Alcohol abuse  Father   . Alcohol abuse Sister   . Alcohol abuse Brother     Her Social History Is Significant For: Social History   Social History  . Marital Status: Married    Spouse Name: N/A  . Number of Children: 4  . Years of Education: HS   Social History Main Topics  . Smoking status: Current Every Day Smoker -- 0.50 packs/day for 40 years    Types: Cigarettes  . Smokeless tobacco: Never Used  . Alcohol Use: No  . Drug Use: No  . Sexual Activity: Not Asked   Other Topics Concern  . None   Social History Narrative   Drinks 2 cups of coffee a day     Her Allergies Are:  No Known Allergies:   Her Current Medications Are:  Outpatient Encounter Prescriptions as of 09/23/2015  Medication Sig  . alendronate (FOSAMAX) 70 MG tablet Take 70 mg by mouth once a week. Take with a full glass of water on an empty stomach.  . citalopram (CELEXA) 10 MG tablet Take 10 mg by mouth daily.  . cyclobenzaprine (FLEXERIL) 10 MG tablet Take 10 mg by mouth 3 (three) times daily as needed for muscle spasms.  . diclofenac sodium (VOLTAREN) 1 % GEL Apply topically 4 (four) times daily.  . furosemide (LASIX) 40 MG tablet Take 40 mg by mouth daily.  Marland Kitchen gabapentin (NEURONTIN) 300 MG capsule Take 300 mg by mouth 3 (three) times daily.  . hydrochlorothiazide (HYDRODIURIL) 25 MG tablet Take 25 mg by mouth daily.  Marland Kitchen ibuprofen (ADVIL,MOTRIN) 800 MG tablet Take 800 mg by mouth 3 (three) times daily.  . metFORMIN (GLUCOPHAGE) 500 MG tablet Take by mouth 2 (two) times daily with a meal.  . Oxycodone HCl 10 MG TABS Take 10 mg by mouth as directed. Take 1 tab every 3-6 hours as needed.  . potassium chloride SA (K-DUR,KLOR-CON) 20 MEQ tablet Take 2 tablets (40 mEq total) by mouth daily.  . temazepam (RESTORIL) 15 MG capsule TAKE ONE CAPSULE BY MOUTH AT BEDTIME AS NEEDED MAY REPEAT ONE TIME  . [DISCONTINUED] Melatonin 5 MG CAPS Take 10 mg by mouth at bedtime.   No facility-administered encounter medications on file as of  09/23/2015.  :  Review of Systems:  Out of a complete 14 point review of systems, all are reviewed and negative with the exception of these symptoms as listed below:   Review of Systems  Neurological:       Patient is here to discuss sleep study     Objective:  Neurologic Exam  Physical Exam Physical Examination:   Filed Vitals:   09/23/15 1259  BP: 128/72  Pulse: 80  Resp: 18   General Examination: The patient is a very pleasant 60 y.o. female  in no acute distress. She appears well-developed and well-nourished and adequately groomed. She is somewhat tearful today.   HEENT: Normocephalic, atraumatic, pupils are equal, round and reactive to light and accommodation. Extraocular tracking is good without limitation to gaze excursion or nystagmus noted. Normal smooth pursuit is noted. Hearing is grossly intact. Face is symmetric with normal facial animation and normal facial sensation. Speech is clear with no dysarthria noted. There is no hypophonia. There is no lip, neck/head, jaw or voice tremor. Neck is supple with full range of passive and active motion. There are no carotid bruits on auscultation. Oropharynx exam reveals: moderate to severe mouth dryness, adequate dental hygiene and moderate airway crowding, due to redundant soft palate, larger appearing tongue. She has full dentures in place. Mallampati is class II. Tongue protrudes centrally and palate elevates symmetrically. Tonsils are absent.     Chest: Clear to auscultation without wheezing, rhonchi or crackles noted.  Heart: S1+S2+0, regular and normal without murmurs, rubs or gallops noted.   Abdomen: Soft, non-tender and non-distended with normal bowel sounds appreciated on auscultation.  Extremities: There is trace pitting edema around the left ankle. Pedal pulses are intact.  Skin: Warm and dry without trophic changes noted. There are no varicose veins.  Musculoskeletal: exam reveals no obvious joint deformities,  tenderness or joint swelling or erythema, except for L knee pain, and L hip pain, LBP, midline.  Neurologically:  Mental status: The patient is awake, alert and oriented in all 4 spheres. Her immediate and remote memory, attention, language skills and fund of knowledge are appropriate. There is no evidence of aphasia, agnosia, apraxia or anomia. Speech is clear with normal prosody and enunciation. Thought process is linear. Mood is somber and affect is constricted.  Cranial nerves II - XII are as described above under HEENT exam. In addition: shoulder shrug is normal with equal shoulder height noted. Motor exam: Normal bulk, strength and tone is noted. There is no drift, tremor or rebound. Romberg is negative. Reflexes are 1+ throughout. Fine motor skills and coordination: intact with normal finger taps, normal hand movements, normal rapid alternating patting, normal foot taps and normal foot agility.  Cerebellar testing: No dysmetria or intention tremor on finger to nose testing. Heel to shin is difficult for her. There is no truncal or gait ataxia.  Sensory exam: intact to light touch in the upper and lower extremities, decrease in PP and vibration in the feet.  Gait, station and balance: She stands with difficulty. No veering to one side is noted. No leaning to one side is noted. Posture is age-appropriate and stance is narrow based. Gait shows normal stride length and normal pace. No problems turning are noted.   Assessment and Plan:  In summary, COTINA FREEDMAN is a very pleasant 60 year old female with an underlying medical history of hypertension, B12 deficiency, headaches, diabetes, mood disorder, followed by psychiatry, low back pain, neck pain, on chronic narcotic pain medication, smoking and morbid obesity, who presents for follow up consultation of her sleep disturbance, in particular, problems with sleep initiation and sleep maintenance as well as a prior diagnosis of obstructive sleep  apnea. Her recent baseline sleep study showed a borderline AHI of 4.7 per hour, and lower oxygen saturations in sleep, REM related OSA. For this, she is advised to pursue weight loss, try to sleep of her back which she usually does, and stop smoking. In addition, due to her chronic narcotic pain medication she is at risk for lower oxygen  saturations and shallow breathing events while asleep. Narcotic pain medications can reduce the respiratory drive. She is advised to discuss this with her pain management doctor and also seek help for smoking cessation and weight loss with her primary care provider. She has an appointment with her PCP coming up soon. She had a recent increase in her oxycodone to 10 mg 4 times a day. Her weight has remained stable. CPAP therapy or oxygen therapy is not warranted at this time. I suggested an as needed follow up with me. I answered all her questions today and the patient was in agreement. She had mild PLMS with minimal arousals. She denies restless leg symptoms but is bothered by bilateral hip pain and back pain at night, causing her to change position from one side to another. I spent 25 minutes in total face-to-face time with the patient, more than 50% of which was spent in counseling and coordination of care, reviewing test results, reviewing medication and discussing or reviewing the diagnosis of OSA, hypoxemia in sleep, the prognosis and treatment options.

## 2015-09-23 NOTE — Patient Instructions (Signed)
Please try to lose weight and stop smoking, this will help with your borderline sleep apnea. Please talk to your primary provider about this.  Your pain medication may be contributing to episodes of shallow breathing.
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
## 2016-11-13 ENCOUNTER — Encounter (HOSPITAL_COMMUNITY): Payer: Self-pay

## 2016-11-13 ENCOUNTER — Emergency Department (HOSPITAL_COMMUNITY)
Admission: EM | Admit: 2016-11-13 | Discharge: 2016-11-14 | Disposition: A | Discharge status: Home / Self Care | Payer: Medicaid Other | Attending: Emergency Medicine | Admitting: Emergency Medicine

## 2016-11-13 DIAGNOSIS — M7989 Other specified soft tissue disorders: Secondary | ICD-10-CM | POA: Diagnosis not present

## 2016-11-13 DIAGNOSIS — E119 Type 2 diabetes mellitus without complications: Secondary | ICD-10-CM | POA: Diagnosis not present

## 2016-11-13 DIAGNOSIS — M79672 Pain in left foot: Secondary | ICD-10-CM | POA: Diagnosis present

## 2016-11-13 DIAGNOSIS — Z79899 Other long term (current) drug therapy: Secondary | ICD-10-CM | POA: Insufficient documentation

## 2016-11-13 DIAGNOSIS — Z794 Long term (current) use of insulin: Secondary | ICD-10-CM | POA: Insufficient documentation

## 2016-11-13 DIAGNOSIS — Z96659 Presence of unspecified artificial knee joint: Secondary | ICD-10-CM | POA: Diagnosis not present

## 2016-11-13 DIAGNOSIS — F17210 Nicotine dependence, cigarettes, uncomplicated: Secondary | ICD-10-CM | POA: Insufficient documentation

## 2016-11-13 DIAGNOSIS — I10 Essential (primary) hypertension: Secondary | ICD-10-CM | POA: Insufficient documentation

## 2016-11-13 MED ORDER — ENOXAPARIN SODIUM 150 MG/ML ~~LOC~~ SOLN
1.0000 mg/kg | Freq: Once | SUBCUTANEOUS | Status: AC
  Administered 2016-11-14: 130 mg via SUBCUTANEOUS
  Filled 2016-11-13: qty 0.87

## 2016-11-13 NOTE — Discharge Instructions (Addendum)
IMPORTANT PATIENT INSTRUCTIONS:  You have been scheduled for an Outpatient Vascular Study at [facility].   ° °If tomorrow is a Saturday, Sunday or holiday, please go to the [facility] Registration Desk at 8 am tomorrow morning and tell them you are there for a vascular study.  If tomorrow is a weekday (Monday-Friday), please go to [facility] at 8 am and tell them  °you are  °there for a vascular study. ° °

## 2016-11-13 NOTE — ED Triage Notes (Signed)
Pt complaining of L foot pain and swelling. Pt states seen by PCP for same, given steroids. Told to come to ED if no improvement to rule out blood clots. Pt denies any chest pain or SOB. Pt ambulatory at triage.

## 2016-11-13 NOTE — ED Provider Notes (Signed)
[facility] DEPT Provider Note   CSN: [id number] Arrival date & time: 11/13/16  2048  By signing my name below, I, Ny'Kea Lewis, attest that this documentation has been prepared under the direction and in the presence of Sherwood Gambler, MD. Electronically Signed: Lise Auer, ED Scribe. 11/13/16. 11:29 PM.  History   Chief Complaint Chief Complaint  Patient presents with  . Foot Pain   The history is provided by the patient. No language interpreter was used.    HPI Comments: Madison Wells is a 61 y.o. female with a PMHx of bipolar disorder, anxiety, DM, HTN, lumbago, and obesity who presents to the Emergency Department complaining of gradually worsening left leg swelling that began two weeks ago. Pt notes associated redness to the area for 2 weeks.. She reports two weeks ago she began to experience left leg swelling that has not improved. Three days ago she went to her PCP and she was given a Prednisone shot and prescription that she has been taking it since with no relief of her swelling. No h/o PE/DVT or recent long travel. She has tried RICE treatment with no improvement.  No recent trauma injuries reported. Denies chest pain, shortness of breath, fever or chills.   Past Medical History:  Diagnosis Date  . Angina   . Anxiety   . Bipolar 1 disorder ([place])   . Cervicalgia   . Chronic pain syndrome   . Diabetes mellitus without complication ([place])   . Disturbance of skin sensation   . Dizziness and giddiness   . Enthesopathy of hip region   . Headache   . Hypertension   . Lumbago   . Obesity   . Pain in joint, hand    Patient Active Problem List   Diagnosis Date Noted  . Right Achilles tendinitis 11/29/2011  . Bilateral knee pain 09/13/2011  . Low back pain 09/13/2011  . Carpal tunnel syndrome 08/13/2011  . Leg swelling 04/21/2011  . Obesity   . Hypertension   . Anxiety   . Bipolar 1 disorder [facility])    Past Surgical History:  Procedure Laterality Date  .  CESAREAN SECTION    . KNEE ARTHROPLASTY    . TONSILLECTOMY     OB History    Gravida Para Term Preterm AB Living   4 4 4     4    SAB TAB Ectopic Multiple Live Births                 Home Medications    Prior to Admission medications   Medication Sig Start Date End Date Taking? Authorizing Provider  alendronate (FOSAMAX) 70 MG tablet Take 70 mg by mouth once a week. Take with a full glass of water on an empty stomach.    [provider]  citalopram (CELEXA) 10 MG tablet Take 10 mg by mouth daily. 09/15/15   [provider]  cyclobenzaprine (FLEXERIL) 10 MG tablet Take 10 mg by mouth 3 (three) times daily as needed for muscle spasms.    [provider]  diclofenac sodium (VOLTAREN) 1 % GEL Apply topically 4 (four) times daily.    [provider]  furosemide (LASIX) 40 MG tablet Take 40 mg by mouth daily.    [provider]  gabapentin (NEURONTIN) 300 MG capsule Take 300 mg by mouth 3 (three) times daily.    [provider]  hydrochlorothiazide (HYDRODIURIL) 25 MG tablet Take 25 mg by mouth daily.    [provider]  ibuprofen (  ADVIL,MOTRIN) 800 MG tablet Take 800 mg by mouth 3 (three) times daily.    [provider]  metFORMIN (GLUCOPHAGE) 500 MG tablet Take by mouth 2 (two) times daily with a meal.    [provider]  Oxycodone HCl 10 MG TABS Take 10 mg by mouth as directed. Take 1 tab every 3-6 hours as needed.    [provider]  potassium chloride SA (K-DUR,KLOR-CON) 20 MEQ tablet Take 2 tablets (40 mEq total) by mouth daily. 01/02/15   Junius Creamer, NP  temazepam (RESTORIL) 15 MG capsule TAKE ONE CAPSULE BY MOUTH AT BEDTIME AS NEEDED MAY REPEAT ONE TIME 08/24/15   [provider]   Family History Family History  Problem Relation Age of Onset  . Alcohol abuse Mother   . Alcohol abuse Father   . Alcohol abuse Sister   . Alcohol abuse Brother     Social History Social History    Substance Use Topics  . Smoking status: Current Every Day Smoker    Packs/day: 0.50    Years: 40.00    Types: Cigarettes  . Smokeless tobacco: Never Used  . Alcohol use No   Allergies   Patient has no known allergies.  Review of Systems Review of Systems  Constitutional: Negative for chills and fever.  Respiratory: Negative for shortness of breath.   Cardiovascular: Positive for leg swelling. Negative for chest pain.  All other systems reviewed and are negative.   Physical Exam Updated Vital Signs BP (!) 154/96   Pulse 70   Temp 98.3 F (36.8 C) (Oral)   Resp 16   Ht 5\' 5"  (1.651 m)   Wt 130.2 kg (287 lb)   LMP 08/13/2011   SpO2 97%   BMI 47.76 kg/m   Physical Exam  Constitutional: She is oriented to person, place, and time. She appears well-developed and well-nourished.  HENT:  Head: Normocephalic and atraumatic.  Right Ear: External ear normal.  Left Ear: External ear normal.  Nose: Nose normal.  Eyes: Right eye exhibits no discharge. Left eye exhibits no discharge.  Cardiovascular: Normal rate and intact distal pulses.   2+ DP pulses bilaterally.   Pulmonary/Chest: Effort normal.  Musculoskeletal: She exhibits edema.  Left foot and ankle swelling. No increased warmth or induration. Mild light erythema to distal dorsal foot  Neurological: She is alert and oriented to person, place, and time.  Skin: Skin is warm and dry.  Nursing note and vitals reviewed.   ED Treatments / Results  DIAGNOSTIC STUDIES: Oxygen Saturation is 98% on RA, normal by my interpretation.   COORDINATION OF CARE: 11:14 PM-Discussed next steps with pt. Pt verbalized understanding and is agreeable with the plan.   Labs (all labs ordered are listed, but only abnormal results are displayed) Labs Reviewed - No data to display  EKG  EKG Interpretation None       Radiology No results found.  Procedures Procedures (including critical care time)  Medications Ordered in  ED Medications  enoxaparin (LOVENOX) injection 130 mg (130 mg Subcutaneous Given 11/14/16 0011)     Initial Impression / Assessment and Plan / ED Course  I have reviewed the triage vital signs and the nursing notes.  Pertinent labs & imaging results that were available during my care of the patient were reviewed by me and considered in my medical decision making (see chart for details).     Unfortunately for patient, we do not have DVT ultrasound at this time of night. Thus, she  has been given a dose of subcutaneous Lovenox and will come back in the morning for ultrasound. Given length of symptoms with no significant worsening of the mild redness at the distal aspect of her foot I think cellulitis is unlikely. No signs or symptoms of deep space infection. No chest pain or shortness of breath. Discussed return precautions with the time she appears stable for close outpatient workup.  Final Clinical Impressions(s) / ED Diagnoses   Final diagnoses:  Left leg swelling    New Prescriptions Discharge Medication List as of 11/13/2016 11:33 PM     I personally performed the services described in this documentation, which was scribed in my presence. The recorded information has been reviewed and is accurate.     Sherwood Gambler, MD 11/14/16 [phone]

## 2016-11-14 ENCOUNTER — Ambulatory Visit (HOSPITAL_COMMUNITY)
Admission: RE | Admit: 2016-11-14 | Discharge: 2016-11-14 | Disposition: A | Discharge status: Home / Self Care | Payer: Medicaid Other | Source: Ambulatory Visit | Attending: Emergency Medicine | Admitting: Emergency Medicine

## 2016-11-14 DIAGNOSIS — M7989 Other specified soft tissue disorders: Secondary | ICD-10-CM

## 2016-11-14 NOTE — Progress Notes (Signed)
VASCULAR LAB PRELIMINARY  PRELIMINARY  PRELIMINARY  PRELIMINARY  Left lower extremity venous duplex completed.    Preliminary report:  There is no DVT or SVT noted in the left lower extremity.   Takyah Ciaramitaro, RVT 11/14/2016, 9:32 AM

## 2016-11-16 ENCOUNTER — Other Ambulatory Visit: Payer: Self-pay | Admitting: Nurse Practitioner

## 2016-11-16 DIAGNOSIS — R609 Edema, unspecified: Secondary | ICD-10-CM

## 2017-02-02 ENCOUNTER — Inpatient Hospital Stay (HOSPITAL_COMMUNITY)
Admission: EM | Admit: 2017-02-02 | Discharge: 2017-02-04 | DRG: 641 | Disposition: A | Discharge status: Home / Self Care | Payer: Medicaid Other | Attending: Family Medicine | Admitting: Family Medicine

## 2017-02-02 ENCOUNTER — Encounter (HOSPITAL_COMMUNITY): Payer: Self-pay | Admitting: *Deleted

## 2017-02-02 ENCOUNTER — Emergency Department (HOSPITAL_COMMUNITY): Payer: Medicaid Other

## 2017-02-02 DIAGNOSIS — N390 Urinary tract infection, site not specified: Secondary | ICD-10-CM | POA: Diagnosis present

## 2017-02-02 DIAGNOSIS — N179 Acute kidney failure, unspecified: Secondary | ICD-10-CM | POA: Diagnosis present

## 2017-02-02 DIAGNOSIS — E871 Hypo-osmolality and hyponatremia: Secondary | ICD-10-CM | POA: Diagnosis not present

## 2017-02-02 DIAGNOSIS — Z7984 Long term (current) use of oral hypoglycemic drugs: Secondary | ICD-10-CM

## 2017-02-02 DIAGNOSIS — E876 Hypokalemia: Secondary | ICD-10-CM | POA: Diagnosis not present

## 2017-02-02 DIAGNOSIS — D72829 Elevated white blood cell count, unspecified: Secondary | ICD-10-CM | POA: Diagnosis present

## 2017-02-02 DIAGNOSIS — Z7983 Long term (current) use of bisphosphonates: Secondary | ICD-10-CM

## 2017-02-02 DIAGNOSIS — I10 Essential (primary) hypertension: Secondary | ICD-10-CM | POA: Diagnosis present

## 2017-02-02 DIAGNOSIS — Z23 Encounter for immunization: Secondary | ICD-10-CM

## 2017-02-02 DIAGNOSIS — Z79899 Other long term (current) drug therapy: Secondary | ICD-10-CM

## 2017-02-02 DIAGNOSIS — R7989 Other specified abnormal findings of blood chemistry: Secondary | ICD-10-CM | POA: Diagnosis present

## 2017-02-02 DIAGNOSIS — E860 Dehydration: Secondary | ICD-10-CM | POA: Diagnosis present

## 2017-02-02 DIAGNOSIS — W19XXXA Unspecified fall, initial encounter: Secondary | ICD-10-CM | POA: Diagnosis present

## 2017-02-02 DIAGNOSIS — R531 Weakness: Secondary | ICD-10-CM

## 2017-02-02 DIAGNOSIS — D72825 Bandemia: Secondary | ICD-10-CM

## 2017-02-02 DIAGNOSIS — F17210 Nicotine dependence, cigarettes, uncomplicated: Secondary | ICD-10-CM | POA: Diagnosis present

## 2017-02-02 DIAGNOSIS — T501X5A Adverse effect of loop [high-ceiling] diuretics, initial encounter: Secondary | ICD-10-CM | POA: Diagnosis present

## 2017-02-02 DIAGNOSIS — E119 Type 2 diabetes mellitus without complications: Secondary | ICD-10-CM

## 2017-02-02 DIAGNOSIS — T503X6A Underdosing of electrolytic, caloric and water-balance agents, initial encounter: Secondary | ICD-10-CM | POA: Diagnosis present

## 2017-02-02 DIAGNOSIS — R778 Other specified abnormalities of plasma proteins: Secondary | ICD-10-CM | POA: Diagnosis present

## 2017-02-02 DIAGNOSIS — Z96659 Presence of unspecified artificial knee joint: Secondary | ICD-10-CM | POA: Diagnosis present

## 2017-02-02 DIAGNOSIS — T502X5A Adverse effect of carbonic-anhydrase inhibitors, benzothiadiazides and other diuretics, initial encounter: Secondary | ICD-10-CM | POA: Diagnosis present

## 2017-02-02 DIAGNOSIS — F319 Bipolar disorder, unspecified: Secondary | ICD-10-CM | POA: Diagnosis not present

## 2017-02-02 HISTORY — DX: Hypokalemia: E87.6

## 2017-02-02 HISTORY — DX: Acute kidney failure, unspecified: N17.9

## 2017-02-02 LAB — CBC
HCT: 45.4 % (ref 36.0–46.0)
Hemoglobin: 17.1 g/dL — ABNORMAL HIGH (ref 12.0–15.0)
MCH: 30.6 pg (ref 26.0–34.0)
MCHC: 37.1 g/dL — ABNORMAL HIGH (ref 30.0–36.0)
MCV: 83.2 fL (ref 78.0–100.0)
Platelets: 299 K/uL (ref 150–400)
RBC: 5.46 MIL/uL — ABNORMAL HIGH (ref 3.87–5.11)
RDW: 13.9 % (ref 11.5–15.5)
WBC: 14.2 K/uL — ABNORMAL HIGH (ref 4.0–10.5)

## 2017-02-02 LAB — CBG MONITORING, ED: Glucose-Capillary: 225 mg/dL — ABNORMAL HIGH (ref 65–99)

## 2017-02-02 LAB — BASIC METABOLIC PANEL WITH GFR
Anion gap: 17 — ABNORMAL HIGH (ref 5–15)
BUN: 20 mg/dL (ref 6–20)
CO2: 25 mmol/L (ref 22–32)
Calcium: 9.8 mg/dL (ref 8.9–10.3)
Chloride: 88 mmol/L — ABNORMAL LOW (ref 101–111)
Creatinine, Ser: 1.61 mg/dL — ABNORMAL HIGH (ref 0.44–1.00)
GFR calc Af Amer: 39 mL/min — ABNORMAL LOW
GFR calc non Af Amer: 34 mL/min — ABNORMAL LOW
Glucose, Bld: 193 mg/dL — ABNORMAL HIGH (ref 65–99)
Potassium: <2 mmol/L — CL (ref 3.5–5.1)
Sodium: 130 mmol/L — ABNORMAL LOW (ref 135–145)

## 2017-02-02 LAB — I-STAT CG4 LACTIC ACID, ED: Lactic Acid, Venous: 3.42 mmol/L (ref 0.5–1.9)

## 2017-02-02 MED ORDER — SODIUM CHLORIDE 0.9 % IV BOLUS (SEPSIS)
500.0000 mL | Freq: Once | INTRAVENOUS | Status: AC
  Administered 2017-02-02: 500 mL via INTRAVENOUS

## 2017-02-02 MED ORDER — SODIUM CHLORIDE 0.9 % IV BOLUS (SEPSIS)
1000.0000 mL | Freq: Once | INTRAVENOUS | Status: AC
  Administered 2017-02-02: 1000 mL via INTRAVENOUS

## 2017-02-02 MED ORDER — POTASSIUM CHLORIDE 10 MEQ/100ML IV SOLN
10.0000 meq | INTRAVENOUS | Status: AC
  Administered 2017-02-02 – 2017-02-03 (×4): 10 meq via INTRAVENOUS
  Filled 2017-02-02 (×3): qty 100

## 2017-02-02 MED ORDER — POTASSIUM CHLORIDE CRYS ER 20 MEQ PO TBCR
60.0000 meq | EXTENDED_RELEASE_TABLET | Freq: Once | ORAL | Status: AC
  Administered 2017-02-02: 60 meq via ORAL
  Filled 2017-02-02: qty 3

## 2017-02-02 NOTE — H&P (Addendum)
Madison Wells VHQ:[id number] DOB: 1956/05/08 DOA: 02/02/2017     PCP: [name]   Outpatient Specialists: [facility] Patient coming from:   home Lives  With family    Chief Complaint: light - Headedness  HPI: Madison Wells is a 61 y.o. female with medical history significant of hypertension, B12 deficiency, headaches, diabetes, mood disorder, followed by psychiatry    Presented with 1 month history of worsening lightheadedness when she tries to stand up sometime she feels that she is about a pass out she has been trying to drink plenty of water and has been urinating normal amounts. On arrival to emergency department patient was diaphoretic tachycardic this improves with oxygenation Denies chest pain.  states it felt like she could not take a deep breath.  The patient is been taking Lasix 40 mg a day for history of leg edema although none known history of CHF. She is also being on hydrochlorothiazide 25 mg a day she supposed to take potassium supplements and reports compliance. Denies any recent nausea vomiting or diarrhea patient is constipated NO fever or chills no cough.  Denies any Black stools or blood in stools. Reports she has been taking care of her grand-daughter and has not been able to take care of her self as much she has not been eating well.    Regarding pertinent Chronic problems:  prediabetic been maintaining on metformin Last echogram was in 2012 showed mild LVH with EF of 55% No hx of blood clots or cancer IN ER:  Temp (24hrs), Avg:97.4 F (36.3 C), Min:97.4 F (36.3 C), Max:97.4 F (36.3 C)      on arrival  ED Triage Vitals  Enc Vitals Group     BP 02/02/17 1904 (!) 80/39     Pulse Rate 02/02/17 1904 (!) 116     Resp 02/02/17 1904 (!) 22     Temp 02/02/17 1904 (!) 97.4 F (36.3 C)     Temp Source 02/02/17 1904 Oral     SpO2 02/02/17 1904 95 %     Weight 02/02/17 2112 287 lb (130.2 kg)     Height 02/02/17 2112 5\' 5"  (1.651 m)     Head  Circumference --      Peak Flow --      Pain Score 02/02/17 1904 0     Pain Loc --      Pain Edu? --      Excl. in [place]? --     Latest RR 14 95% BP initially on arrival 80/39 currently up to 99/82 patient initially orthostatic and symptomatic when standing up  Lactic acid 3.42 NA 130, K less than 2.0, glucose 193, BUN 20, CR 1.61 up from 0.7 at baseline  AG17  WBC 14.2 HG 17.1 Patient technically meeting SIRS criteria Following Medications were ordered in ER: Medications  potassium chloride 10 mEq in 100 mL IVPB (10 mEq Intravenous New Bag/Given 02/02/17 2107)  sodium chloride 0.9 % bolus 500 mL (not administered)  sodium chloride 0.9 % bolus 1,000 mL (1,000 mLs Intravenous New Bag/Given 02/02/17 2002)  potassium chloride SA (K-DUR,KLOR-CON) CR tablet 60 mEq (60 mEq Oral Given 02/02/17 2109)      Hospitalist was called for admission for Severe dehydration and hypokalemia secondary to overdiuresis  Review of Systems:    Pertinent positives include: Palpitations,  lightheadedness  Constitutional:  No weight loss, night sweats, Fevers, chills, fatigue, weight loss  HEENT:  No headaches, Difficulty swallowing,Tooth/dental problems,Sore throat,  No  sneezing, itching, ear ache, nasal congestion, post nasal drip,  Cardio-vascular:  No chest pain, Orthopnea, PND, anasarca, dizziness, palpitations.no Bilateral lower extremity swelling  GI:  No heartburn, indigestion, abdominal pain, nausea, vomiting, diarrhea, change in bowel habits, loss of appetite, melena, blood in stool, hematemesis Resp:  no shortness of breath at rest. No dyspnea on exertion, No excess mucus, no productive cough, No non-productive cough, No coughing up of blood.No change in color of mucus.No wheezing. Skin:  no rash or lesions. No jaundice GU:  no dysuria, change in color of urine, no urgency or frequency. No straining to urinate.  No flank pain.  Musculoskeletal:  No joint pain or no joint swelling. No decreased  range of motion. No back pain.  Psych:  No change in mood or affect. No depression or anxiety. No memory loss.  Neuro: no localizing neurological complaints, no tingling, no weakness, no double vision, no gait abnormality, no slurred speech, no confusion  As per HPI otherwise 10 point review of systems negative.   Past Medical History: Past Medical History:  Diagnosis Date  . Angina   . Anxiety   . Bipolar 1 disorder ([place])   . Cervicalgia   . Chronic pain syndrome   . Diabetes mellitus without complication ([place])   . Disturbance of skin sensation   . Dizziness and giddiness   . Enthesopathy of hip region   . Headache   . Hypertension   . Lumbago   . Obesity   . Pain in joint, hand    Past Surgical History:  Procedure Laterality Date  . CESAREAN SECTION    . KNEE ARTHROPLASTY    . TONSILLECTOMY       Social History:  Ambulatory   Independently or cane    reports that she has been smoking Cigarettes.  She has a 20.00 pack-year smoking history. She has never used smokeless tobacco. She reports that she does not drink alcohol or use drugs.  Allergies:  No Known Allergies     Family History:   Family History  Problem Relation Age of Onset  . Alcohol abuse Mother   . Alcohol abuse Father   . Alcohol abuse Sister   . Alcohol abuse Brother     Medications: Prior to Admission medications   Medication Sig Start Date End Date Taking? Authorizing Provider  alendronate (FOSAMAX) 70 MG tablet Take 70 mg by mouth once a week. Take with a full glass of water on an empty stomach.    [provider]  citalopram (CELEXA) 10 MG tablet Take 10 mg by mouth daily. 09/15/15   [provider]  cyclobenzaprine (FLEXERIL) 10 MG tablet Take 10 mg by mouth 3 (three) times daily as needed for muscle spasms.    [provider]  diclofenac sodium (VOLTAREN) 1 % GEL Apply topically 4 (four) times daily.    [provider]  furosemide (LASIX) 40 MG tablet  Take 40 mg by mouth daily.    [provider]  gabapentin (NEURONTIN) 300 MG capsule Take 300 mg by mouth 3 (three) times daily.    [provider]  hydrochlorothiazide (HYDRODIURIL) 25 MG tablet Take 25 mg by mouth daily.    [provider]  ibuprofen (ADVIL,MOTRIN) 800 MG tablet Take 800 mg by mouth 3 (three) times daily.    [provider]  metFORMIN (GLUCOPHAGE) 500 MG tablet Take by mouth 2 (two) times daily with a meal.    [provider]  Oxycodone HCl 10  MG TABS Take 10 mg by mouth as directed. Take 1 tab every 3-6 hours as needed.    [provider]  potassium chloride SA (K-DUR,KLOR-CON) 20 MEQ tablet Take 2 tablets (40 mEq total) by mouth daily. 01/02/15   Junius Creamer, NP  temazepam (RESTORIL) 15 MG capsule TAKE ONE CAPSULE BY MOUTH AT BEDTIME AS NEEDED MAY REPEAT ONE TIME 08/24/15   [provider]    Physical Exam: Patient Vitals for the past 24 hrs:  BP Temp Temp src Pulse Resp SpO2 Height Weight  02/02/17 2112 - - - - - - 5\' 5"  (1.651 m) 130.2 kg (287 lb)  02/02/17 2100 99/82 - - 95 14 95 % - -  02/02/17 2015 (!) 103/57 - - 97 12 96 % - -  02/02/17 2000 103/75 - - (!) 101 14 96 % - -  02/02/17 1904 (!) 80/39 (!) 97.4 F (36.3 C) Oral (!) 116 (!) 22 95 % - -    1. General:  in No Acute distress  well  -appearing 2. Psychological: Alert and   Oriented 3. Head/ENT:     Dry Mucous Membranes                          Head Non traumatic, neck supple                            Poor Dentition 4. SKIN:  decreased Skin turgor,  Skin clean Dry and intact no rash 5. Heart: Regular rate and rhythm no  Murmur, no Rub or gallop 6. Lungs: Clear to auscultation bilaterally, no wheezes or crackles   7. Abdomen: Soft,  non-tender, Non distended  Obese bowel sounds present 8. Lower extremities: no clubbing, cyanosis, or edema 9. Neurologically Grossly intact, moving all 4 extremities equally  10. MSK: Normal range of  motion   body mass index is 47.76 kg/m.  Labs on Admission:   Labs on Admission: I have personally reviewed following labs and imaging studies  CBC:  Recent Labs Lab 02/02/17 1913  WBC 14.2*  HGB 17.1*  HCT 45.4  MCV 83.2  PLT 354   Basic Metabolic Panel:  Recent Labs Lab 02/02/17 1913  NA 130*  K <2.0*  CL 88*  CO2 25  GLUCOSE 193*  BUN 20  CREATININE 1.61*  CALCIUM 9.8   GFR: Estimated Creatinine Clearance: 50.6 mL/min (A) (by C-G formula based on SCr of 1.61 mg/dL (H)). Liver Function Tests: No results for input(s): AST, ALT, ALKPHOS, BILITOT, PROT, ALBUMIN in the last 168 hours. No results for input(s): LIPASE, AMYLASE in the last 168 hours. No results for input(s): AMMONIA in the last 168 hours. Coagulation Profile: No results for input(s): INR, PROTIME in the last 168 hours. Cardiac Enzymes: No results for input(s): CKTOTAL, CKMB, CKMBINDEX, TROPONINI in the last 168 hours. BNP (last 3 results) No results for input(s): PROBNP in the last 8760 hours. HbA1C: No results for input(s): HGBA1C in the last 72 hours. CBG:  Recent Labs Lab 02/02/17 1911  GLUCAP 225*   Lipid Profile: No results for input(s): CHOL, HDL, LDLCALC, TRIG, CHOLHDL, LDLDIRECT in the last 72 hours. Thyroid Function Tests: No results for input(s): TSH, T4TOTAL, FREET4, T3FREE, THYROIDAB in the last 72 hours. Anemia Panel: No results for input(s): VITAMINB12, FOLATE, FERRITIN, TIBC, IRON, RETICCTPCT in the last 72 hours. Urine analysis:    Component Value Date/Time  LABSPEC 1.010 07/03/2007 1114   PHURINE 5.5 07/03/2007 1114   GLUCOSEU NEGATIVE 07/03/2007 1114   HGBUR NEGATIVE 07/03/2007 1114   BILIRUBINUR NEGATIVE 07/03/2007 1114   KETONESUR NEGATIVE 07/03/2007 1114   PROTEINUR NEGATIVE 07/03/2007 1114   UROBILINOGEN 0.2 07/03/2007 1114   NITRITE NEGATIVE 07/03/2007 1114   LEUKOCYTESUR  07/03/2007 1114    NEGATIVE Biochemical Testing Only. Please order routine urinalysis  from main lab if confirmatory testing is needed.   Sepsis Labs: @LABRCNTIP (procalcitonin:4,lacticidven:4) )No results found for this or any previous visit (from the past 240 hour(s)).    UA  ordered  Lab Results  Component Value Date   HGBA1C 5.8 (H) 04/13/2011    Estimated Creatinine Clearance: 50.6 mL/min (A) (by C-G formula based on SCr of 1.61 mg/dL (H)).  BNP (last 3 results) No results for input(s): PROBNP in the last 8760 hours.   ECG REPORT  Independently reviewed Rate: 88  Rhythm: NSR ST&T Change: No acute ischemic changes  QTC 486  Filed Weights   02/02/17 2112  Weight: 130.2 kg (287 lb)     Cultures: No results found for: SDES, SPECREQUEST, CULT, REPTSTATUS   Radiological Exams on Admission: Dg Chest 2 View  Result Date: 02/02/2017 CLINICAL DATA:  61 year old female with weakness and hypotension for 1 day. EXAM: CHEST  2 VIEW COMPARISON:  Chest radiographs 01/17/2015 and earlier. FINDINGS: Upright AP and lateral views of the chest. Lung volumes are stable and within normal limits. Mild eventration of the right hemidiaphragm is unchanged. Mediastinal contours are within normal limits. Visualized tracheal air column is within normal limits. No pneumothorax, pulmonary edema, pleural effusion or acute pulmonary opacity. There is chronic curvilinear anterior lung scarring more visible on the lateral view. No acute osseous abnormality identified. Negative visible bowel gas pattern. IMPRESSION: No acute cardiopulmonary abnormality. Electronically Signed   By: Genevie Ann M.D.   On: 02/02/2017 20:35    Chart has been reviewed    Assessment/Plan  61 y.o. female with medical history significant of hypertension, B12 deficiency, headaches, diabetes, mood disorder, followed by psychiatry Admitted for Severe dehydration and hypokalemia secondary to overdiuresis   Present on Admission:  . Hypertension - For tonight hold Lasix and hydrochlorothiazide given severe electrolyte  abnormalities and hypotension. She has no known history of coronary artery disease or CHF unsure why on Lasix. Marland Kitchen AKI (acute kidney injury) (HCC)Most likely secondary to dehydration hold Lasix and hydrochlorothiazide rehydrate . DehydrationRehydrate check orthostatics prior to discharge . Leukocytosis - Most likely hemoconcentration no evidence of infection to this point hold antibiotics . Hypokalemia -severe we'll replete, order magnesium and phosphate level hold Lasix . Elevated lactic acid level Currently improving with IV fluid resuscitation . Hyponatremia - - likely secondary to dehydration, will give IVF, check Urine Na, Cr, Osmolarity. Monitor Na levels to avoid over aggressive correction. Check TSH. Stop offending medications. If no improvement with IVF will initiate further work up for SIADH if appropriate.  Patient median sirs criteria but no evidence of infection will hold off on antibiotics for now . Bipolar 1 disorder (HCC) - Currently stable continue to monitor DM 2 -  - Order Sensitive  SSI     -  check TSH and HgA1C  - Hold by mouth medication  Elevated troponin - no chest pain d-dimer negative most likely demand from hypotension. Obtain echogram, will continue to cycle check CK  Other plan as per orders.  DVT prophylaxis:    Lovenox     Code Status:  FULL CODE  as per patient    Family Communication:   Family not  at  Bedside    Disposition Plan:    To home once workup is complete and patient is stable                       Would benefit from PT/OT eval prior to DC  ordered                                                 Consults called: none  Admission status: obs   Level of care        SDU      I have spent a total of 56 min on this admission Madison Wells 02/03/2017, 1:09 AM   [facility]  Pager [phone]   after 2 AM please page floor coverage PA If 7AM-7PM, please contact the day team taking care of the patient  [email or web address]  Password [email or web address]

## 2017-02-02 NOTE — ED Notes (Signed)
Pt attempted urine sample and was unsuccessful. Will try again

## 2017-02-02 NOTE — ED Provider Notes (Signed)
[facility] DEPT Provider Note   CSN: [id number] Arrival date & time: 02/02/17  1821     History   Chief Complaint Chief Complaint  Patient presents with  . Dizziness  . Near Syncope    HPI Madison Wells is a 61 y.o. female.  Patient with history of diabetes, bipolar disorder, hypertension, peripheral edema -- presents with complaint of malaise and lightheadedness ongoing for approximately one month. Symptoms were worse today prompting emergency department evaluation. Patient has had intermittent episodes of near syncope describes as lightheadedness. No full syncope. She feels generally weak and states that she fell when she tried to sit down the other day because of feeling weak. Today she was having worsening trouble breathing and shortness of breath. No chest pain. Upon arrival patient found to be tachycardic and hypotensive. This improved with rest. Patient denies recent fevers, URI symptoms, vomiting, diarrhea. No blood bloody or black stools reported. Patient takes Lasix at home for lower extremity swelling, she denies a history of congestive heart failure. She is also on HCTZ. The onset of this condition was gradual. The course is worsening. Aggravating factors: none. Alleviating factors: none.        Past Medical History:  Diagnosis Date  . Angina   . Anxiety   . Bipolar 1 disorder ([place])   . Cervicalgia   . Chronic pain syndrome   . Diabetes mellitus without complication ([place])   . Disturbance of skin sensation   . Dizziness and giddiness   . Enthesopathy of hip region   . Headache   . Hypertension   . Lumbago   . Obesity   . Pain in joint, hand     Patient Active Problem List   Diagnosis Date Noted  . Right Achilles tendinitis 11/29/2011  . Bilateral knee pain 09/13/2011  . Low back pain 09/13/2011  . Carpal tunnel syndrome 08/13/2011  . Leg swelling 04/21/2011  . Obesity   . Hypertension   . Anxiety   . Bipolar 1 disorder [facility])     Past  Surgical History:  Procedure Laterality Date  . CESAREAN SECTION    . KNEE ARTHROPLASTY    . TONSILLECTOMY      OB History    Gravida Para Term Preterm AB Living   4 4 4     4    SAB TAB Ectopic Multiple Live Births                   Home Medications    Prior to Admission medications   Medication Sig Start Date End Date Taking? Authorizing Provider  alendronate (FOSAMAX) 70 MG tablet Take 70 mg by mouth once a week. Take with a full glass of water on an empty stomach.    [provider]  citalopram (CELEXA) 10 MG tablet Take 10 mg by mouth daily. 09/15/15   [provider]  cyclobenzaprine (FLEXERIL) 10 MG tablet Take 10 mg by mouth 3 (three) times daily as needed for muscle spasms.    [provider]  diclofenac sodium (VOLTAREN) 1 % GEL Apply topically 4 (four) times daily.    [provider]  furosemide (LASIX) 40 MG tablet Take 40 mg by mouth daily.    [provider]  gabapentin (NEURONTIN) 300 MG capsule Take 300 mg by mouth 3 (three) times daily.    [provider]  hydrochlorothiazide (HYDRODIURIL) 25 MG tablet Take 25 mg by mouth daily.    [provider]  ibuprofen (ADVIL,MOTRIN) 800  MG tablet Take 800 mg by mouth 3 (three) times daily.    [provider]  metFORMIN (GLUCOPHAGE) 500 MG tablet Take by mouth 2 (two) times daily with a meal.    [provider]  Oxycodone HCl 10 MG TABS Take 10 mg by mouth as directed. Take 1 tab every 3-6 hours as needed.    [provider]  potassium chloride SA (K-DUR,KLOR-CON) 20 MEQ tablet Take 2 tablets (40 mEq total) by mouth daily. 01/02/15   Junius Creamer, NP  temazepam (RESTORIL) 15 MG capsule TAKE ONE CAPSULE BY MOUTH AT BEDTIME AS NEEDED MAY REPEAT ONE TIME 08/24/15   [provider]    Family History Family History  Problem Relation Age of Onset  . Alcohol abuse Mother   . Alcohol abuse Father   . Alcohol abuse Sister   . Alcohol  abuse Brother     Social History Social History  Substance Use Topics  . Smoking status: Current Every Day Smoker    Packs/day: 0.50    Years: 40.00    Types: Cigarettes  . Smokeless tobacco: Never Used  . Alcohol use No     Allergies   Patient has no known allergies.   Review of Systems Review of Systems  Constitutional: Positive for diaphoresis and fatigue. Negative for fever.  HENT: Negative for rhinorrhea and sore throat.   Eyes: Negative for redness.  Respiratory: Negative for cough.   Cardiovascular: Positive for leg swelling (no significant swelling at current time). Negative for chest pain.  Gastrointestinal: Negative for abdominal pain, diarrhea, nausea and vomiting.  Genitourinary: Negative for dysuria.  Musculoskeletal: Negative for myalgias.  Skin: Negative for rash.  Neurological: Positive for weakness (generalized) and light-headedness. Negative for syncope and headaches.     Physical Exam Updated Vital Signs BP (!) 80/39 (BP Location: Left Arm)   Pulse (!) 116   Temp (!) 97.4 F (36.3 C) (Oral)   Resp (!) 22   LMP 08/13/2011   SpO2 95%   Physical Exam  Constitutional: She is oriented to person, place, and time. She appears well-developed and well-nourished.  HENT:  Head: Normocephalic and atraumatic.  Mouth/Throat: Oropharynx is clear and moist. Mucous membranes are dry.  Eyes: Conjunctivae are normal. Right eye exhibits no discharge. Left eye exhibits no discharge.  Neck: Normal range of motion. Neck supple.  Cardiovascular: Normal rate, regular rhythm and normal heart sounds.   Pulmonary/Chest: Effort normal and breath sounds normal. No respiratory distress. She has no wheezes. She has no rales.  Abdominal: Soft. There is no tenderness. There is no rebound and no guarding.  Neurological: She is alert and oriented to person, place, and time. She has normal strength. No cranial nerve deficit or sensory deficit.  Skin: Skin is warm and dry.    Psychiatric: She has a normal mood and affect.  Nursing note and vitals reviewed.    ED Treatments / Results  Labs (all labs ordered are listed, but only abnormal results are displayed) Labs Reviewed  BASIC METABOLIC PANEL - Abnormal; Notable for the following:       Result Value   Sodium 130 (*)    Potassium <2.0 (*)    Chloride 88 (*)    Glucose, Bld 193 (*)    Creatinine, Ser 1.61 (*)    GFR calc non Af Amer 34 (*)    GFR calc Af Amer 39 (*)    Anion gap 17 (*)    All other components within normal  limits  CBC - Abnormal; Notable for the following:    WBC 14.2 (*)    RBC 5.46 (*)    Hemoglobin 17.1 (*)    MCHC 37.1 (*)    All other components within normal limits  CBG MONITORING, ED - Abnormal; Notable for the following:    Glucose-Capillary 225 (*)    All other components within normal limits  I-STAT CG4 LACTIC ACID, ED - Abnormal; Notable for the following:    Lactic Acid, Venous 3.42 (*)    All other components within normal limits  CULTURE, BLOOD (ROUTINE X 2)  CULTURE, BLOOD (ROUTINE X 2)  URINALYSIS, ROUTINE W REFLEX MICROSCOPIC  MAGNESIUM  PHOSPHORUS  CREATININE, URINE, RANDOM  SODIUM, URINE, RANDOM  OSMOLALITY, URINE  HEPATIC FUNCTION PANEL  D-DIMER, QUANTITATIVE (NOT AT [facility])  TROPONIN I  TROPONIN I  TROPONIN I  LACTIC ACID, PLASMA    EKG  EKG Interpretation  Date/Time:  Wednesday February 02 2017 19:00:25 EDT Ventricular Rate:  106 PR Interval:  166 QRS Duration: 102 QT Interval:  384 QTC Calculation: 510 R Axis:   -123 Text Interpretation:  Sinus tachycardia Inferior infarct , age undetermined Possible Anterolateral infarct , age undetermined Abnormal ECG Confirmed by Virgel Manifold [phone]) on 02/02/2017 7:16:56 PM       Radiology Dg Chest 2 View  Result Date: 02/02/2017 CLINICAL DATA:  61 year old female with weakness and hypotension for 1 day. EXAM: CHEST  2 VIEW COMPARISON:  Chest radiographs 01/17/2015 and earlier. FINDINGS:  Upright AP and lateral views of the chest. Lung volumes are stable and within normal limits. Mild eventration of the right hemidiaphragm is unchanged. Mediastinal contours are within normal limits. Visualized tracheal air column is within normal limits. No pneumothorax, pulmonary edema, pleural effusion or acute pulmonary opacity. There is chronic curvilinear anterior lung scarring more visible on the lateral view. No acute osseous abnormality identified. Negative visible bowel gas pattern. IMPRESSION: No acute cardiopulmonary abnormality. Electronically Signed   By: Genevie Ann M.D.   On: 02/02/2017 20:35    Procedures Procedures (including critical care time)  Medications Ordered in ED Medications  sodium chloride 0.9 % bolus 1,000 mL (1,000 mLs Intravenous New Bag/Given 02/02/17 2002)     Initial Impression / Assessment and Plan / ED Course  I have reviewed the triage vital signs and the nursing notes.  Pertinent labs & imaging results that were available during my care of the patient were reviewed by me and considered in my medical decision making (see chart for details).     Patient seen and examined. Work-up initiated. Medications ordered.   Vital signs reviewed and are as follows: BP 99/82   Pulse 95   Temp (!) 97.4 F (36.3 C) (Oral)   Resp 14   Ht 5\' 5"  (1.651 m)   Wt 130.2 kg (287 lb)   LMP 08/13/2011   SpO2 95%   BMI 47.76 kg/m   Will need admission for symptomatic hypokalemia. Repletion started. Pending Mag. BP improving with fluids, she appears dehydrated.   Discussed with Dr. Wilson Singer.   10:00 PM Spoke with Dr. Roel Cluck who will see.   CRITICAL CARE Performed by: Faustino Congress Total critical care time: 35 minutes Critical care time was exclusive of separately billable procedures and treating other patients. Critical care was necessary to treat or prevent imminent or life-threatening deterioration. Critical care was time spent personally by me on the following  activities: development of treatment plan with patient and/or surrogate as well as  nursing, discussions with consultants, evaluation of patient's response to treatment, examination of patient, obtaining history from patient or surrogate, ordering and performing treatments and interventions, ordering and review of laboratory studies, ordering and review of radiographic studies, pulse oximetry and re-evaluation of patient's condition.   Final Clinical Impressions(s) / ED Diagnoses   Final diagnoses:  Hypokalemia  Dehydration  Generalized weakness   Patient with hypokalemia, hypotension responding to fluids. Admit for treatment.  New Prescriptions New Prescriptions   No medications on file     Carlisle Cater, Hershal Coria 02/02/17 2245    Virgel Manifold, MD 02/11/17 [phone]

## 2017-02-02 NOTE — ED Triage Notes (Signed)
Pt states head dizzy times one month and worse today to the point she feels she is going to pass out.  Pt ambulated to room and set down and then immediately said she was going to pass out.  Pt was diaphoretic, tachycardiac, and diaphoretic.  Pt the felt like she could not breath, applied oxygen and laid back.  Pt feeling better.  Attempting to place patient in a room immediately

## 2017-02-03 ENCOUNTER — Observation Stay (HOSPITAL_COMMUNITY): Payer: Medicaid Other

## 2017-02-03 DIAGNOSIS — T503X6A Underdosing of electrolytic, caloric and water-balance agents, initial encounter: Secondary | ICD-10-CM | POA: Diagnosis present

## 2017-02-03 DIAGNOSIS — T501X5A Adverse effect of loop [high-ceiling] diuretics, initial encounter: Secondary | ICD-10-CM | POA: Diagnosis present

## 2017-02-03 DIAGNOSIS — D72825 Bandemia: Secondary | ICD-10-CM | POA: Diagnosis not present

## 2017-02-03 DIAGNOSIS — R748 Abnormal levels of other serum enzymes: Secondary | ICD-10-CM

## 2017-02-03 DIAGNOSIS — N179 Acute kidney failure, unspecified: Secondary | ICD-10-CM | POA: Diagnosis present

## 2017-02-03 DIAGNOSIS — E871 Hypo-osmolality and hyponatremia: Secondary | ICD-10-CM | POA: Diagnosis present

## 2017-02-03 DIAGNOSIS — F319 Bipolar disorder, unspecified: Secondary | ICD-10-CM | POA: Diagnosis not present

## 2017-02-03 DIAGNOSIS — Z23 Encounter for immunization: Secondary | ICD-10-CM | POA: Diagnosis not present

## 2017-02-03 DIAGNOSIS — R778 Other specified abnormalities of plasma proteins: Secondary | ICD-10-CM | POA: Diagnosis present

## 2017-02-03 DIAGNOSIS — W19XXXA Unspecified fall, initial encounter: Secondary | ICD-10-CM | POA: Diagnosis present

## 2017-02-03 DIAGNOSIS — Z7984 Long term (current) use of oral hypoglycemic drugs: Secondary | ICD-10-CM | POA: Diagnosis not present

## 2017-02-03 DIAGNOSIS — R0600 Dyspnea, unspecified: Secondary | ICD-10-CM

## 2017-02-03 DIAGNOSIS — Z79899 Other long term (current) drug therapy: Secondary | ICD-10-CM | POA: Diagnosis not present

## 2017-02-03 DIAGNOSIS — E876 Hypokalemia: Secondary | ICD-10-CM | POA: Diagnosis present

## 2017-02-03 DIAGNOSIS — E119 Type 2 diabetes mellitus without complications: Secondary | ICD-10-CM | POA: Diagnosis present

## 2017-02-03 DIAGNOSIS — N390 Urinary tract infection, site not specified: Secondary | ICD-10-CM | POA: Diagnosis present

## 2017-02-03 DIAGNOSIS — F17210 Nicotine dependence, cigarettes, uncomplicated: Secondary | ICD-10-CM | POA: Diagnosis present

## 2017-02-03 DIAGNOSIS — T502X5A Adverse effect of carbonic-anhydrase inhibitors, benzothiadiazides and other diuretics, initial encounter: Secondary | ICD-10-CM | POA: Diagnosis present

## 2017-02-03 DIAGNOSIS — Z7983 Long term (current) use of bisphosphonates: Secondary | ICD-10-CM | POA: Diagnosis not present

## 2017-02-03 DIAGNOSIS — R7989 Other specified abnormal findings of blood chemistry: Secondary | ICD-10-CM | POA: Diagnosis not present

## 2017-02-03 DIAGNOSIS — I10 Essential (primary) hypertension: Secondary | ICD-10-CM | POA: Diagnosis present

## 2017-02-03 DIAGNOSIS — E860 Dehydration: Secondary | ICD-10-CM | POA: Diagnosis present

## 2017-02-03 DIAGNOSIS — Z96659 Presence of unspecified artificial knee joint: Secondary | ICD-10-CM | POA: Diagnosis present

## 2017-02-03 LAB — COMPREHENSIVE METABOLIC PANEL
ALK PHOS: 89 U/L (ref 38–126)
ALT: 25 U/L (ref 14–54)
ANION GAP: 11 (ref 5–15)
AST: 25 U/L (ref 15–41)
Albumin: 3.2 g/dL — ABNORMAL LOW (ref 3.5–5.0)
BILIRUBIN TOTAL: 0.6 mg/dL (ref 0.3–1.2)
BUN: 21 mg/dL — ABNORMAL HIGH (ref 6–20)
CALCIUM: 8.5 mg/dL — AB (ref 8.9–10.3)
CO2: 26 mmol/L (ref 22–32)
Chloride: 94 mmol/L — ABNORMAL LOW (ref 101–111)
Creatinine, Ser: 1.14 mg/dL — ABNORMAL HIGH (ref 0.44–1.00)
GFR, EST AFRICAN AMERICAN: 59 mL/min — AB (ref >60)
GFR, EST NON AFRICAN AMERICAN: 51 mL/min — AB (ref >60)
GLUCOSE: 125 mg/dL — AB (ref 65–99)
Potassium: <2 mmol/L — CL (ref 3.5–5.1)
Sodium: 131 mmol/L — ABNORMAL LOW (ref 135–145)
TOTAL PROTEIN: 6 g/dL — AB (ref 6.5–8.1)

## 2017-02-03 LAB — CBC
HCT: 38.9 % (ref 36.0–46.0)
HEMOGLOBIN: 13.9 g/dL (ref 12.0–15.0)
MCH: 29.8 pg (ref 26.0–34.0)
MCHC: 35.7 g/dL (ref 30.0–36.0)
MCV: 83.3 fL (ref 78.0–100.0)
Platelets: 264 10*3/uL (ref 150–400)
RBC: 4.67 MIL/uL (ref 3.87–5.11)
RDW: 14 % (ref 11.5–15.5)
WBC: 11.3 10*3/uL — AB (ref 4.0–10.5)

## 2017-02-03 LAB — BASIC METABOLIC PANEL
ANION GAP: 11 (ref 5–15)
BUN: 20 mg/dL (ref 6–20)
CALCIUM: 8.4 mg/dL — AB (ref 8.9–10.3)
CO2: 25 mmol/L (ref 22–32)
Chloride: 97 mmol/L — ABNORMAL LOW (ref 101–111)
Creatinine, Ser: 1.05 mg/dL — ABNORMAL HIGH (ref 0.44–1.00)
GFR, EST NON AFRICAN AMERICAN: 57 mL/min — AB (ref >60)
Glucose, Bld: 133 mg/dL — ABNORMAL HIGH (ref 65–99)
Potassium: 2.5 mmol/L — CL (ref 3.5–5.1)
Sodium: 133 mmol/L — ABNORMAL LOW (ref 135–145)

## 2017-02-03 LAB — URINALYSIS, ROUTINE W REFLEX MICROSCOPIC
BILIRUBIN URINE: NEGATIVE
Glucose, UA: NEGATIVE mg/dL
HGB URINE DIPSTICK: NEGATIVE
Ketones, ur: NEGATIVE mg/dL
Nitrite: NEGATIVE
PROTEIN: 100 mg/dL — AB
SPECIFIC GRAVITY, URINE: 1.021 (ref 1.005–1.030)
pH: 5 (ref 5.0–8.0)

## 2017-02-03 LAB — ECHOCARDIOGRAM COMPLETE
HEIGHTINCHES: 65 in
WEIGHTICAEL: 4444.47 [oz_av]

## 2017-02-03 LAB — MAGNESIUM
MAGNESIUM: 2.1 mg/dL (ref 1.7–2.4)
Magnesium: 2.2 mg/dL (ref 1.7–2.4)

## 2017-02-03 LAB — CREATININE, URINE, RANDOM: Creatinine, Urine: 230.56 mg/dL

## 2017-02-03 LAB — PROTIME-INR
INR: 0.93
Prothrombin Time: 12.4 seconds (ref 11.4–15.2)

## 2017-02-03 LAB — GLUCOSE, CAPILLARY
GLUCOSE-CAPILLARY: 142 mg/dL — AB (ref 65–99)
GLUCOSE-CAPILLARY: 158 mg/dL — AB (ref 65–99)
Glucose-Capillary: 121 mg/dL — ABNORMAL HIGH (ref 65–99)
Glucose-Capillary: 209 mg/dL — ABNORMAL HIGH (ref 65–99)
Glucose-Capillary: 137 mg/dL — ABNORMAL HIGH (ref 65–99)

## 2017-02-03 LAB — HEPATIC FUNCTION PANEL
ALBUMIN: 3.3 g/dL — AB (ref 3.5–5.0)
ALK PHOS: 86 U/L (ref 38–126)
ALT: 26 U/L (ref 14–54)
AST: 27 U/L (ref 15–41)
BILIRUBIN DIRECT: 0.1 mg/dL (ref 0.1–0.5)
BILIRUBIN TOTAL: 0.6 mg/dL (ref 0.3–1.2)
Indirect Bilirubin: 0.5 mg/dL (ref 0.3–0.9)
Total Protein: 6.3 g/dL — ABNORMAL LOW (ref 6.5–8.1)

## 2017-02-03 LAB — CBC WITH DIFFERENTIAL/PLATELET
Basophils Absolute: 0 10*3/uL (ref 0.0–0.1)
Basophils Relative: 0 %
EOS ABS: 0.1 10*3/uL (ref 0.0–0.7)
Eosinophils Relative: 1 %
HCT: 39.4 % (ref 36.0–46.0)
Hemoglobin: 14.3 g/dL (ref 12.0–15.0)
LYMPHS ABS: 3.4 10*3/uL (ref 0.7–4.0)
Lymphocytes Relative: 29 %
MCH: 30.4 pg (ref 26.0–34.0)
MCHC: 36.3 g/dL — AB (ref 30.0–36.0)
MCV: 83.8 fL (ref 78.0–100.0)
MONOS PCT: 6 %
Monocytes Absolute: 0.7 10*3/uL (ref 0.1–1.0)
Neutro Abs: 7.5 10*3/uL (ref 1.7–7.7)
Neutrophils Relative %: 64 %
PLATELETS: 241 10*3/uL (ref 150–400)
RBC: 4.7 MIL/uL (ref 3.87–5.11)
RDW: 14 % (ref 11.5–15.5)
WBC: 11.6 10*3/uL — AB (ref 4.0–10.5)

## 2017-02-03 LAB — TROPONIN I
Troponin I: 0.04 ng/mL (ref <0.03)
Troponin I: 0.05 ng/mL (ref <0.03)
Troponin I: 0.03 ng/mL (ref <0.03)

## 2017-02-03 LAB — LACTIC ACID, PLASMA
LACTIC ACID, VENOUS: 1.2 mmol/L (ref 0.5–1.9)
Lactic Acid, Venous: 2.9 mmol/L (ref 0.5–1.9)

## 2017-02-03 LAB — PHOSPHORUS
Phosphorus: 3.4 mg/dL (ref 2.5–4.6)
Phosphorus: 2.7 mg/dL (ref 2.5–4.6)

## 2017-02-03 LAB — OSMOLALITY, URINE: Osmolality, Ur: 431 mOsm/kg (ref 300–900)

## 2017-02-03 LAB — CK: Total CK: 88 U/L (ref 38–234)

## 2017-02-03 LAB — TSH: TSH: 1.558 u[IU]/mL (ref 0.350–4.500)

## 2017-02-03 LAB — HIV ANTIBODY (ROUTINE TESTING W REFLEX): HIV SCREEN 4TH GENERATION: NONREACTIVE

## 2017-02-03 LAB — APTT: aPTT: 26 seconds (ref 24–36)

## 2017-02-03 LAB — HEMOGLOBIN A1C
HEMOGLOBIN A1C: 6.5 % — AB (ref 4.8–5.6)
MEAN PLASMA GLUCOSE: 139.85 mg/dL

## 2017-02-03 LAB — PROCALCITONIN: Procalcitonin: <0.1 ng/mL

## 2017-02-03 LAB — SODIUM, URINE, RANDOM: Sodium, Ur: <10 mmol/L

## 2017-02-03 LAB — MRSA PCR SCREENING: MRSA by PCR: NEGATIVE

## 2017-02-03 LAB — D-DIMER, QUANTITATIVE: D-Dimer, Quant: 0.32 ug/mL-FEU (ref 0.00–0.50)

## 2017-02-03 MED ORDER — POTASSIUM CHLORIDE CRYS ER 20 MEQ PO TBCR
40.0000 meq | EXTENDED_RELEASE_TABLET | Freq: Once | ORAL | Status: AC
  Administered 2017-02-03: 40 meq via ORAL
  Filled 2017-02-03: qty 2

## 2017-02-03 MED ORDER — POTASSIUM CHLORIDE CRYS ER 20 MEQ PO TBCR: 20.0000 meq | EXTENDED_RELEASE_TABLET | Freq: Two times a day (BID) | ORAL | Status: DC

## 2017-02-03 MED ORDER — SODIUM CHLORIDE 0.9 % IV SOLN
INTRAVENOUS | Status: DC
  Administered 2017-02-03: 06:00:00 via INTRAVENOUS

## 2017-02-03 MED ORDER — GABAPENTIN 300 MG PO CAPS
300.0000 mg | ORAL_CAPSULE | Freq: Three times a day (TID) | ORAL | Status: DC
  Administered 2017-02-03 – 2017-02-04 (×5): 300 mg via ORAL
  Filled 2017-02-03 (×5): qty 1

## 2017-02-03 MED ORDER — POTASSIUM CHLORIDE 10 MEQ/100ML IV SOLN
INTRAVENOUS | Status: AC
  Filled 2017-02-03: qty 100

## 2017-02-03 MED ORDER — PAROXETINE HCL 10 MG PO TABS
10.0000 mg | ORAL_TABLET | Freq: Every day | ORAL | Status: DC
  Administered 2017-02-03 – 2017-02-04 (×2): 10 mg via ORAL
  Filled 2017-02-03 (×2): qty 1

## 2017-02-03 MED ORDER — SENNA 8.6 MG PO TABS
1.0000 | ORAL_TABLET | Freq: Two times a day (BID) | ORAL | Status: DC
  Administered 2017-02-03 – 2017-02-04 (×2): 8.6 mg via ORAL
  Filled 2017-02-03 (×3): qty 1

## 2017-02-03 MED ORDER — ENOXAPARIN SODIUM 40 MG/0.4ML ~~LOC~~ SOLN
40.0000 mg | SUBCUTANEOUS | Status: DC
  Administered 2017-02-04: 40 mg via SUBCUTANEOUS
  Filled 2017-02-03: qty 0.4

## 2017-02-03 MED ORDER — DEXTROSE 5 % IV SOLN
1.0000 g | INTRAVENOUS | Status: DC
  Administered 2017-02-03 – 2017-02-04 (×2): 1 g via INTRAVENOUS
  Filled 2017-02-03 (×3): qty 10

## 2017-02-03 MED ORDER — ENOXAPARIN SODIUM 30 MG/0.3ML ~~LOC~~ SOLN
30.0000 mg | SUBCUTANEOUS | Status: DC
  Administered 2017-02-03: 30 mg via SUBCUTANEOUS
  Filled 2017-02-03: qty 0.3

## 2017-02-03 MED ORDER — INFLUENZA VAC SPLIT QUAD 0.5 ML IM SUSY
0.5000 mL | PREFILLED_SYRINGE | INTRAMUSCULAR | Status: AC
  Administered 2017-02-04: 0.5 mL via INTRAMUSCULAR
  Filled 2017-02-03: qty 0.5

## 2017-02-03 MED ORDER — ONDANSETRON HCL 4 MG/2ML IJ SOLN: 4.0000 mg | Freq: Four times a day (QID) | INTRAMUSCULAR | Status: DC | PRN

## 2017-02-03 MED ORDER — INSULIN ASPART 100 UNIT/ML ~~LOC~~ SOLN
0.0000 [IU] | Freq: Three times a day (TID) | SUBCUTANEOUS | Status: DC
Start: 2017-02-03 — End: 2017-02-04
  Administered 2017-02-03: 2 [IU] via SUBCUTANEOUS
  Administered 2017-02-03: 1 [IU] via SUBCUTANEOUS
  Administered 2017-02-03: 3 [IU] via SUBCUTANEOUS
  Administered 2017-02-04 (×2): 1 [IU] via SUBCUTANEOUS

## 2017-02-03 MED ORDER — POTASSIUM CHLORIDE 10 MEQ/100ML IV SOLN
10.0000 meq | INTRAVENOUS | Status: AC
  Administered 2017-02-03 – 2017-02-04 (×5): 10 meq via INTRAVENOUS
  Filled 2017-02-03 (×4): qty 100

## 2017-02-03 MED ORDER — ACETAMINOPHEN 325 MG PO TABS: 650.0000 mg | ORAL_TABLET | Freq: Four times a day (QID) | ORAL | Status: DC | PRN

## 2017-02-03 MED ORDER — POLYETHYLENE GLYCOL 3350 17 G PO PACK: 17.0000 g | PACK | Freq: Every day | ORAL | Status: DC | PRN

## 2017-02-03 MED ORDER — PNEUMOCOCCAL VAC POLYVALENT 25 MCG/0.5ML IJ INJ
0.5000 mL | INJECTION | INTRAMUSCULAR | Status: AC
  Administered 2017-02-04: 0.5 mL via INTRAMUSCULAR
  Filled 2017-02-03: qty 0.5

## 2017-02-03 MED ORDER — POTASSIUM CHLORIDE 10 MEQ/100ML IV SOLN
10.0000 meq | INTRAVENOUS | Status: AC
  Administered 2017-02-03 (×5): 10 meq via INTRAVENOUS
  Filled 2017-02-03 (×5): qty 100

## 2017-02-03 MED ORDER — HYDROCODONE-ACETAMINOPHEN 5-325 MG PO TABS
1.0000 | ORAL_TABLET | ORAL | Status: DC | PRN
  Administered 2017-02-03: 2 via ORAL
  Filled 2017-02-03: qty 2

## 2017-02-03 MED ORDER — INSULIN ASPART 100 UNIT/ML ~~LOC~~ SOLN: 0.0000 [IU] | Freq: Every day | SUBCUTANEOUS | Status: DC

## 2017-02-03 MED ORDER — ONDANSETRON HCL 4 MG PO TABS: 4.0000 mg | ORAL_TABLET | Freq: Four times a day (QID) | ORAL | Status: DC | PRN

## 2017-02-03 MED ORDER — ZOLPIDEM TARTRATE 5 MG PO TABS
5.0000 mg | ORAL_TABLET | Freq: Once | ORAL | Status: AC
  Administered 2017-02-03: 5 mg via ORAL
  Filled 2017-02-03: qty 1

## 2017-02-03 MED ORDER — BISACODYL 10 MG RE SUPP: 10.0000 mg | Freq: Every day | RECTAL | Status: DC | PRN

## 2017-02-03 MED ORDER — OXYCODONE HCL 5 MG PO TABS
10.0000 mg | ORAL_TABLET | Freq: Four times a day (QID) | ORAL | Status: DC
  Administered 2017-02-03 – 2017-02-04 (×6): 10 mg via ORAL
  Filled 2017-02-03 (×6): qty 2

## 2017-02-03 MED ORDER — ACETAMINOPHEN 650 MG RE SUPP: 650.0000 mg | Freq: Four times a day (QID) | RECTAL | Status: DC | PRN

## 2017-02-03 MED ORDER — SODIUM CHLORIDE 0.9 % IV SOLN
INTRAVENOUS | Status: AC
  Administered 2017-02-03 (×2): 75 mL/h via INTRAVENOUS

## 2017-02-03 MED ORDER — POTASSIUM CHLORIDE CRYS ER 20 MEQ PO TBCR
40.0000 meq | EXTENDED_RELEASE_TABLET | Freq: Three times a day (TID) | ORAL | Status: AC
  Administered 2017-02-03 (×3): 40 meq via ORAL
  Filled 2017-02-03 (×3): qty 2

## 2017-02-03 NOTE — Progress Notes (Signed)
  Echocardiogram 2D Echocardiogram has been performed.  Pharell Rolfson 02/03/2017, 2:45 PM

## 2017-02-03 NOTE — Evaluation (Signed)
Physical Therapy Evaluation Patient Details Name: Madison Wells MRN: [id number] DOB: [birth year] Today's Date: 02/03/2017   History of Present Illness  Madison Wells is a 61 y.o. female with medical history significant of hypertension, B12 deficiency, headaches, diabetes, mood disorder, followed by psychiatry who presented with severe dehydration and severe hypokalemia  Clinical Impression  Pt admitted with above diagnosis. Pt currently with functional limitations due to the deficits listed below (see PT Problem List). Pt was able to ambulate with one UE support in halls with min guard assist.  No LOB with min challenges to balance. Uses cane at home.  Will follow to ensure progression of ambulation and pt has 12 steps to get into home.   Pt will benefit from skilled PT to increase their independence and safety with mobility to allow discharge to the venue listed below.    Follow Up Recommendations No PT follow up;Supervision/Assistance - 24 hour    Equipment Recommendations  None recommended by PT    Recommendations for Other Services       Precautions / Restrictions Precautions Precautions: Fall Restrictions Weight Bearing Restrictions: No      Mobility  Bed Mobility Overal bed mobility: Independent                Transfers Overall transfer level: Independent                  Ambulation/Gait Ambulation/Gait assistance: Min guard Ambulation Distance (Feet): 350 Feet Assistive device: 1 person hand held assist Gait Pattern/deviations: Step-through pattern;Decreased stride length;Wide base of support   Gait velocity interpretation: Below normal speed for age/gender General Gait Details: Overall pt with safe gait.  Was able to ambulate in halls.  no LOB but did have 1 UE support.  Has cane that she uses at home.   Stairs            Wheelchair Mobility    Modified Rankin (Stroke Patients Only)       Balance                                              Pertinent Vitals/Pain Pain Assessment: No/denies pain  VSS  Home Living Family/patient expects to be discharged to:: Private residence Living Arrangements: Spouse/significant other Available Help at Discharge: Family;Available 24 hours/day Type of Home: Apartment (lives in 2nd floor apartment) Home Access: Stairs to enter Entrance Stairs-Rails: Right Entrance Stairs-Number of Steps: 12 Home Layout: One level Home Equipment: Cane - single point      Prior Function Level of Independence: Independent with assistive device(s)               Hand Dominance        Extremity/Trunk Assessment   Upper Extremity Assessment Upper Extremity Assessment: Defer to OT evaluation    Lower Extremity Assessment Lower Extremity Assessment: Generalized weakness    Cervical / Trunk Assessment Cervical / Trunk Assessment: Normal  Communication   Communication: No difficulties  Cognition Arousal/Alertness: Awake/alert Behavior During Therapy: WFL for tasks assessed/performed Overall Cognitive Status: Within Functional Limits for tasks assessed                                        General Comments      Exercises  Assessment/Plan    PT Assessment Patient needs continued PT services  PT Problem List Decreased activity tolerance;Decreased balance;Decreased strength;Decreased mobility;Decreased knowledge of use of DME;Decreased safety awareness;Decreased knowledge of precautions       PT Treatment Interventions DME instruction;Functional mobility training;Therapeutic activities;Therapeutic exercise;Balance training;Stair training;Gait training;Patient/family education    PT Goals (Current goals can be found in the Care Plan section)  Acute Rehab PT Goals Patient Stated Goal: to go home PT Goal Formulation: With patient Time For Goal Achievement: 02/17/17 Potential to Achieve Goals: Good    Frequency Min 3X/week    Barriers to discharge        Co-evaluation               AM-PAC PT "6 Clicks" Daily Activity  Outcome Measure Difficulty turning over in bed (including adjusting bedclothes, sheets and blankets)?: None Difficulty moving from lying on back to sitting on the side of the bed? : None Difficulty sitting down on and standing up from a chair with arms (e.g., wheelchair, bedside commode, etc,.)?: None Help needed moving to and from a bed to chair (including a wheelchair)?: None Help needed walking in hospital room?: A Little Help needed climbing 3-5 steps with a railing? : A Lot 6 Click Score: 21    End of Session Equipment Utilized During Treatment: Gait belt Activity Tolerance: Patient limited by fatigue Patient left: with call bell/phone within reach;in bed;with family/visitor present (at EOB) Nurse Communication: Mobility status PT Visit Diagnosis: Muscle weakness (generalized) (M62.81)    Time: 1000-1025 PT Time Calculation (min) (ACUTE ONLY): 25 min   Charges:   PT Evaluation $PT Eval Low Complexity: 1 Low PT Treatments $Gait Training: 8-22 mins   PT G Codes:   PT G-Codes **NOT FOR INPATIENT CLASS** Functional Assessment Tool Used: AM-PAC 6 Clicks Basic Mobility Functional Limitation: Mobility: Walking and moving around Mobility: Walking and Moving Around Current Status (T1572): At least 1 percent but less than 20 percent impaired, limited or restricted Mobility: Walking and Moving Around Goal Status [phone]): At least 1 percent but less than 20 percent impaired, limited or restricted    [place] [phone] (pager)   Denice Paradise 02/03/2017, 10:51 AM

## 2017-02-03 NOTE — Care Management Note (Signed)
Case Management Note  Patient Details  Name: Madison Wells MRN: [id number] Date of Birth: 04/03/1956  Subjective/Objective:        Pt admitted with severe dehydration and hypokalemia             Action/Plan:  PTA independent from home - uses cane for ambulation assistance "sometimes".  CM will continue to follow for discharge needs   Expected Discharge Date:                  Expected Discharge Plan:  Home/Self Care  In-House Referral:     Discharge planning Services  CM Consult  Post Acute Care Choice:    Choice offered to:     DME Arranged:    DME Agency:     HH Arranged:    HH Agency:     Status of Service:     If discussed at [employer] of Stay Meetings, dates discussed:    Additional Comments:  Maryclare Labrador, RN 02/03/2017, 3:45 PM

## 2017-02-03 NOTE — Progress Notes (Signed)
Purse

## 2017-02-03 NOTE — Progress Notes (Signed)
Inpatient Diabetes Program Recommendations  AACE/ADA: New Consensus Statement on Inpatient Glycemic Control (2015)  Target Ranges:  Prepandial:   less than 140 mg/dL      Peak postprandial:   less than 180 mg/dL (1-2 hours)      Critically ill patients:  140 - 180 mg/dL   Lab Results  Component Value Date   GLUCAP 209 (H) 02/03/2017   HGBA1C 6.5 (H) 02/03/2017    Review of Glycemic Control  Results for KARIANA, WILES (MRN [id number]) as of 02/03/2017 09:16  Ref. Range 02/02/2017 19:11 02/03/2017 04:13 02/03/2017 07:11  Glucose-Capillary Latest Ref Range: 65 - 99 mg/dL 225 (H) 121 (H) 209 (H)    Diabetes history: Type 2 Outpatient Diabetes medications: Metformin 500mg  bid Current orders for Inpatient glycemic control: Novolog 0-9 units tid, Novolog 0-5 units qhs  Inpatient Diabetes Program Recommendations:  Elevated CBG this am- no medication adjustment suggested- question whether the patient ate something in the early am.  Gentry Fitz, RN, BA, MHA, CDE Diabetes Coordinator Inpatient Diabetes Program  [phone] (Team Pager) [phone] ([place]) 02/03/2017 9:24 AM

## 2017-02-03 NOTE — Progress Notes (Signed)
PROGRESS NOTE   Madison Wells  JJK:[id number]  DOB: 09-20-1955  DOA: 02/02/2017 PCP: [name]  Brief Admission Hx: Madison Wells is a 61 y.o. female with medical history significant of hypertension, B12 deficiency, headaches, diabetes, mood disorder, followed by psychiatry who presented with severe dehydration and severe hypokalemia.   MDM/Assessment & Plan:   Severe hypokalemia - from taking 2 diuretics, poor compliance with oral potassium supplementation, still with severe total body potassium depletion.  Continue to replace, follow magnesium closely.  Monitor on telemetry.    AKI - prerenal, improving with IVF hydration.   Hyponatremia - from dehydration, improving with IVFs.   UTI - ceftriaxone x 3 doses ordered.   DM type 2 - SSI ordered.  Follow CBG.   Leukocytosis - follow CBC, treating UTI, get CXR in AM.    Chronic pain syndrome - resumed home pain medications.   Bipolar disorder - stable.   DVT prophylaxis: lovenox Code Status: full  Family Communication: none present at bedside Disposition Plan: Home   Subjective: Pt without complaints.   Objective: Vitals:   02/03/17 0230 02/03/17 0300 02/03/17 0408 02/03/17 0711  BP: 122/86 113/73 115/80 108/81  Pulse: 78 76 84 78  Resp: 13 17 16 16   Temp:   97.8 F (36.6 C) 97.8 F (36.6 C)  TempSrc:   Oral Oral  SpO2: 99% 99% 97% 100%  Weight:   126 kg (277 lb 12.5 oz)   Height:   5\' 5"  (1.651 m)     Intake/Output Summary (Last 24 hours) at 02/03/17 0745 Last data filed at 02/03/17 0111  Gross per 24 hour  Intake             1800 ml  Output                0 ml  Net             1800 ml   Filed Weights   02/02/17 2112 02/03/17 0408  Weight: 130.2 kg (287 lb) 126 kg (277 lb 12.5 oz)   REVIEW OF SYSTEMS  As per history otherwise all reviewed and reported negative  Exam:  General exam: awake, alert, NAD.   Respiratory system: Clear. No increased work of breathing. Cardiovascular  system: S1 & S2 heard, RRR.  Gastrointestinal system: Abdomen is nondistended, soft and nontender. Normal bowel sounds heard. Central nervous system: Alert and oriented. No focal neurological deficits. Extremities: no clubbing or cyanosis.  Data Reviewed: Basic Metabolic Panel:  Recent Labs Lab 02/02/17 1913 02/02/17 2357 02/03/17 0428 02/03/17 0541  NA 130*  --  131*  --   K <2.0*  --  <2.0*  --   CL 88*  --  94*  --   CO2 25  --  26  --   GLUCOSE 193*  --  125*  --   BUN 20  --  21*  --   CREATININE 1.61*  --  1.14*  --   CALCIUM 9.8  --  8.5*  --   MG  --  2.1  --  2.2  PHOS  --  3.4 2.7  --    Liver Function Tests:  Recent Labs Lab 02/02/17 2357 02/03/17 0428  AST 27 25  ALT 26 25  ALKPHOS 86 89  BILITOT 0.6 0.6  PROT 6.3* 6.0*  ALBUMIN 3.3* 3.2*   No results for input(s): LIPASE, AMYLASE in the last 168 hours. No results for input(s): AMMONIA in the last 168  hours. CBC:  Recent Labs Lab 02/02/17 1913 02/03/17 0428 02/03/17 0541  WBC 14.2* 11.3* 11.6*  NEUTROABS  --   --  7.5  HGB 17.1* 13.9 14.3  HCT 45.4 38.9 39.4  MCV 83.2 83.3 83.8  PLT 299 264 241   Cardiac Enzymes:  Recent Labs Lab 02/02/17 2357 02/03/17 0428  CKTOTAL  --  88  TROPONINI 0.04* 0.05*   CBG (last 3)   Recent Labs  02/02/17 1911 02/03/17 0413 02/03/17 0711  GLUCAP 225* 121* 209*   Recent Results (from the past 240 hour(s))  MRSA PCR Screening     Status: None   Collection Time: 02/03/17  4:06 AM  Result Value Ref Range Status   MRSA by PCR NEGATIVE NEGATIVE Final    Comment:        The GeneXpert MRSA Assay (FDA approved for NASAL specimens only), is one component of a comprehensive MRSA colonization surveillance program. It is not intended to diagnose MRSA infection nor to guide or monitor treatment for MRSA infections.     Studies: Dg Chest 2 View  Result Date: 02/02/2017 CLINICAL DATA:  61 year old female with weakness and hypotension for 1 day. EXAM:  CHEST  2 VIEW COMPARISON:  Chest radiographs 01/17/2015 and earlier. FINDINGS: Upright AP and lateral views of the chest. Lung volumes are stable and within normal limits. Mild eventration of the right hemidiaphragm is unchanged. Mediastinal contours are within normal limits. Visualized tracheal air column is within normal limits. No pneumothorax, pulmonary edema, pleural effusion or acute pulmonary opacity. There is chronic curvilinear anterior lung scarring more visible on the lateral view. No acute osseous abnormality identified. Negative visible bowel gas pattern. IMPRESSION: No acute cardiopulmonary abnormality. Electronically Signed   By: Genevie Ann M.D.   On: 02/02/2017 20:35   Scheduled Meds: . enoxaparin (LOVENOX) injection  30 mg Subcutaneous Q24H  . gabapentin  300 mg Oral TID  . [START ON 02/04/2017] Influenza vac split quadrivalent PF  0.5 mL Intramuscular Tomorrow-1000  . insulin aspart  0-5 Units Subcutaneous QHS  . insulin aspart  0-9 Units Subcutaneous TID WC  . oxyCODONE  10 mg Oral QID  . PARoxetine  10 mg Oral Daily  . [START ON 02/04/2017] pneumococcal 23 valent vaccine  0.5 mL Intramuscular Tomorrow-1000  . potassium chloride SA  40 mEq Oral TID  . senna  1 tablet Oral BID   Continuous Infusions: . sodium chloride    . potassium chloride 10 mEq (02/03/17 0647)  . potassium chloride      Active Problems:   Hypertension   Bipolar 1 disorder (HCC)   AKI (acute kidney injury) ([place])   Dehydration   Leukocytosis   DM (diabetes mellitus), type 2 (HCC)   Hypokalemia   Elevated lactic acid level   Hyponatremia   Elevated troponin   UTI (urinary tract infection)  Critical Care Time spent: 38 mins  Irwin Brakeman, MD, FAAFP [facility] Pager [phone]  If 7PM-7AM, please contact night-coverage [email or web address] Password [email or web address] 02/03/2017, 7:45 AM    LOS: 0 days

## 2017-02-03 NOTE — Progress Notes (Signed)
CRITICAL VALUE ALERT  Critical Value: K+ <2.0  Date & Time Notied: 02/03/17 5248  Provider Notified: Schorr NP  Orders Received/Actions taken. K+ replacement orders written and implemented

## 2017-02-03 NOTE — Progress Notes (Signed)
CRITICAL VALUE ALERT  Critical Value: K+=2.5  Date & Time Notied:  02/03/17   1425  Provider Notified:Dr Wynetta Emery paged  Orders Received/Actions taken:awaiting orders

## 2017-02-03 NOTE — ED Notes (Signed)
CRITICAL VALUE ALERT  Critical Value:  Lactic 2.9 trop 0.04

## 2017-02-03 NOTE — ED Notes (Signed)
Report attempted, RN to call back. 

## 2017-02-04 LAB — BASIC METABOLIC PANEL
ANION GAP: 8 (ref 5–15)
Anion gap: 8 (ref 5–15)
BUN: 15 mg/dL (ref 6–20)
BUN: 14 mg/dL (ref 6–20)
CALCIUM: 8 mg/dL — AB (ref 8.9–10.3)
CO2: 24 mmol/L (ref 22–32)
CO2: 19 mmol/L — AB (ref 22–32)
CREATININE: 0.77 mg/dL (ref 0.44–1.00)
Calcium: 7.9 mg/dL — ABNORMAL LOW (ref 8.9–10.3)
Chloride: 102 mmol/L (ref 101–111)
Chloride: 107 mmol/L (ref 101–111)
Creatinine, Ser: 0.88 mg/dL (ref 0.44–1.00)
GFR calc Af Amer: >60 mL/min (ref >60)
GFR calc Af Amer: >60 mL/min (ref >60)
GFR calc non Af Amer: >60 mL/min (ref >60)
GLUCOSE: 133 mg/dL — AB (ref 65–99)
GLUCOSE: 163 mg/dL — AB (ref 65–99)
POTASSIUM: 3.5 mmol/L (ref 3.5–5.1)
Potassium: 2.7 mmol/L — CL (ref 3.5–5.1)
Sodium: 134 mmol/L — ABNORMAL LOW (ref 135–145)
Sodium: 134 mmol/L — ABNORMAL LOW (ref 135–145)

## 2017-02-04 LAB — GLUCOSE, CAPILLARY
GLUCOSE-CAPILLARY: 172 mg/dL — AB (ref 65–99)
GLUCOSE-CAPILLARY: 125 mg/dL — AB (ref 65–99)
Glucose-Capillary: 136 mg/dL — ABNORMAL HIGH (ref 65–99)

## 2017-02-04 LAB — MAGNESIUM: Magnesium: 2.6 mg/dL — ABNORMAL HIGH (ref 1.7–2.4)

## 2017-02-04 MED ORDER — POTASSIUM CHLORIDE CRYS ER 20 MEQ PO TBCR: 20.0000 meq | EXTENDED_RELEASE_TABLET | Freq: Every day | ORAL | 0 refills | Status: DC

## 2017-02-04 MED ORDER — HYDROCHLOROTHIAZIDE 12.5 MG PO TABS: 12.5000 mg | ORAL_TABLET | Freq: Every day | ORAL | 0 refills | Status: DC

## 2017-02-04 MED ORDER — POTASSIUM CHLORIDE CRYS ER 10 MEQ PO TBCR
EXTENDED_RELEASE_TABLET | ORAL | Status: AC
  Filled 2017-02-04: qty 4

## 2017-02-04 MED ORDER — POTASSIUM CHLORIDE CRYS ER 20 MEQ PO TBCR
60.0000 meq | EXTENDED_RELEASE_TABLET | Freq: Once | ORAL | Status: AC
  Administered 2017-02-04: 60 meq via ORAL
  Filled 2017-02-04: qty 3

## 2017-02-04 MED ORDER — POTASSIUM CHLORIDE 10 MEQ/100ML IV SOLN
10.0000 meq | INTRAVENOUS | Status: AC
  Administered 2017-02-04 (×3): 10 meq via INTRAVENOUS
  Filled 2017-02-04 (×2): qty 100

## 2017-02-04 MED ORDER — POTASSIUM CHLORIDE 10 MEQ/100ML IV SOLN
INTRAVENOUS | Status: AC
  Filled 2017-02-04: qty 100

## 2017-02-04 NOTE — Progress Notes (Signed)
Physical Therapy Treatment Patient Details Name: Madison Wells MRN: [id number] DOB: [birth year] Today's Date: 02/04/2017    History of Present Illness Madison Wells is a 61 y.o. female with medical history significant of hypertension, B12 deficiency, headaches, diabetes, mood disorder, followed by psychiatry who presented with severe dehydration and severe hypokalemia    PT Comments    Patient is making good progress with PT.  From a mobility standpoint anticipate patient will be ready for DC home when medically ready.   Follow Up Recommendations  No PT follow up;Supervision/Assistance - 24 hour     Equipment Recommendations  None recommended by PT    Recommendations for Other Services       Precautions / Restrictions Precautions Precautions: Fall Restrictions Weight Bearing Restrictions: No    Mobility  Bed Mobility Overal bed mobility: Independent                Transfers Overall transfer level: Independent                  Ambulation/Gait Ambulation/Gait assistance: Supervision Ambulation Distance (Feet): 200 Feet Assistive device: None Gait Pattern/deviations: Step-through pattern;Decreased stride length;Wide base of support Gait velocity: decreased   General Gait Details: supervision for safety; pt with grossly steady gait and decreased cadence; one LOB in room when reaching outside BOS with horizontal head turn but no assist needed to recover   Stairs Stairs: Yes   Stair Management: One rail Left;Step to pattern;Alternating pattern;Forwards Number of Stairs:  (flight) General stair comments: cues for step pattern for energy conservation; pt with SOB with stair negoiation and SpO2 96% on RA  Wheelchair Mobility    Modified Rankin (Stroke Patients Only)       Balance                                            Cognition Arousal/Alertness: Awake/alert Behavior During Therapy: WFL for tasks  assessed/performed Overall Cognitive Status: Within Functional Limits for tasks assessed                                        Exercises      General Comments        Pertinent Vitals/Pain Pain Assessment: No/denies pain    Home Living Family/patient expects to be discharged to:: Private residence Living Arrangements: Spouse/significant other Available Help at Discharge: Family;Available 24 hours/day Type of Home: Apartment Home Access: Stairs to enter Entrance Stairs-Rails: Right Home Layout: One level Home Equipment: Cane - single point      Prior Function Level of Independence: Independent with assistive device(s)          PT Goals (current goals can now be found in the care plan section) Acute Rehab PT Goals Patient Stated Goal: to go home PT Goal Formulation: With patient Time For Goal Achievement: 02/17/17 Potential to Achieve Goals: Good Progress towards PT goals: Progressing toward goals    Frequency    Min 3X/week      PT Plan Current plan remains appropriate    Co-evaluation              AM-PAC PT "6 Clicks" Daily Activity  Outcome Measure  Difficulty turning over in bed (including adjusting bedclothes, sheets and blankets)?: None Difficulty moving from lying on back  to sitting on the side of the bed? : None Difficulty sitting down on and standing up from a chair with arms (e.g., wheelchair, bedside commode, etc,.)?: None Help needed moving to and from a bed to chair (including a wheelchair)?: None Help needed walking in hospital room?: A Little Help needed climbing 3-5 steps with a railing? : A Little 6 Click Score: 22    End of Session Equipment Utilized During Treatment: Gait belt Activity Tolerance: Patient tolerated treatment well Patient left: with call bell/phone within reach (sitting EOB) Nurse Communication: Mobility status PT Visit Diagnosis: Muscle weakness (generalized) (M62.81)     Time: 4765-4650 PT  Time Calculation (min) (ACUTE ONLY): 10 min  Charges:  $Gait Training: 8-22 mins                    G Codes:       Earney Navy, PTA Pager: [phone]     Darliss Cheney 02/04/2017, 3:18 PM

## 2017-02-04 NOTE — Discharge Instructions (Signed)
Follow with Primary MD  [name]  and other consultant's as instructed your Hospitalist MD  Please get a complete blood count and chemistry panel checked by your Primary MD at your next visit, and again as instructed by your Primary MD.  Get Medicines reviewed and adjusted: Please take all your medications with you for your next visit with your Primary MD  Laboratory/radiological data: Please request your Primary MD to go over all hospital tests and procedure/radiological results at the follow up, please ask your Primary MD to get all Hospital records sent to his/her office.  In some cases, they will be blood work, cultures and biopsy results pending at the time of your discharge. Please request that your primary care M.D. follows up on these results.  Also Note the following: If you experience worsening of your admission symptoms, develop shortness of breath, life threatening emergency, suicidal or homicidal thoughts you must seek medical attention immediately by calling 911 or calling your MD immediately  if symptoms less severe.  You must read complete instructions/literature along with all the possible adverse reactions/side effects for all the Medicines you take and that have been prescribed to you. Take any new Medicines after you have completely understood and accpet all the possible adverse reactions/side effects.   Do not drive when taking Pain medications or sleeping medications (Benzodaizepines)  Do not take more than prescribed Pain, Sleep and Anxiety Medications. It is not advisable to combine anxiety,sleep and pain medications without talking with your primary care practitioner  Special Instructions: If you have smoked or chewed Tobacco  in the last 2 yrs please stop smoking, stop any regular Alcohol  and or any Recreational drug use.  Wear Seat belts while driving.  Please note: You were cared for by a hospitalist during your hospital stay. Once you are discharged, your  primary care physician will handle any further medical issues. Please note that NO REFILLS for any discharge medications will be authorized once you are discharged, as it is imperative that you return to your primary care physician (or establish a relationship with a primary care physician if you do not have one) for your post hospital discharge needs so that they can reassess your need for medications and monitor your lab values.

## 2017-02-04 NOTE — Discharge Summary (Signed)
Physician Discharge Summary  Madison Wells ATF:[id number] DOB: 1955-10-24 DOA: 02/02/2017  PCP: [name]  Admit date: 02/02/2017 Discharge date: 02/04/2017  Admitted From: Home  Disposition: Home   Recommendations for Outpatient Follow-up:  1. Follow up with PCP in 1 weeks 2. Please obtain BMP/CBC in one week  Discharge Condition: STABLE   CODE STATUS: FULL    Brief Hospitalization Summary: Please see all hospital notes, images, labs for full details of the hospitalization.  Brief Admission Hx: Madison Depaulo McFaddenis a 61 y.o.femalewith medical history significant of hypertension, B12 deficiency, headaches, diabetes, mood disorder, followed by psychiatry who presented with severe dehydration and severe hypokalemia taking 2 diuretics at the same time.   MDM/Assessment & Plan:   Severe hypokalemia - from taking 2 diuretics, poor compliance with oral potassium supplementation, she presented with severe total body potassium depletion.  I have given additional 90 meq this morning of potassium and K has improved to 3.5.  I have discontinued the lasix and reduced the HCTZ to 12.5 mg daily.  Magnesium has been normal.  Pt will be discharged home with close outpatient follow up and repeat BMP. Monitored on telemetry.  I tried to counsel patient that it was the 2 diuretics that she was taking that is the cause of her admission but she doesn't believe that and would prefer to discuss with her PCP which she is planning to do.  I'm fearful that she is HIGH RISK for ending up back in the ED if she does not change what she is taking or at least have her labs monitored more regularly.  Her potassium was critically low this time.    AKI - prerenal, improved with IVF hydration.   Hyponatremia - from dehydration, improved with IVFs.   UTI - ceftriaxone IV was given in hospital and treated.   DM type 2 - SSI ordered.  Follow CBG. Resume home meds at discharge.   Chronic pain syndrome -  resumed home pain medications.   Bipolar disorder - stable.   DVT prophylaxis: lovenox Code Status: full  Family Communication: present at bedside Disposition Plan: Home    Discharge Diagnoses:  Active Problems:   Hypertension   Bipolar 1 disorder (HCC)   AKI (acute kidney injury) ([place])   Dehydration   Leukocytosis   DM (diabetes mellitus), type 2 (HCC)   Hypokalemia   Elevated lactic acid level   Hyponatremia   Elevated troponin   UTI (urinary tract infection)    Discharge Instructions: Discharge Instructions    Call MD for:  difficulty breathing, headache or visual disturbances    Complete by:  As directed    Call MD for:  extreme fatigue    Complete by:  As directed    Call MD for:  persistant dizziness or light-headedness    Complete by:  As directed    Call MD for:  persistant nausea and vomiting    Complete by:  As directed    Call MD for:  temperature >100.4    Complete by:  As directed    Diet - low sodium heart healthy    Complete by:  As directed    Increase activity slowly    Complete by:  As directed      Allergies as of 02/04/2017   No Known Allergies     Medication List    STOP taking these medications   furosemide 40 MG tablet Commonly known as:  LASIX     TAKE these medications  alendronate 70 MG tablet Commonly known as:  FOSAMAX Take 70 mg by mouth once a week. Take with a full glass of water on an empty stomach.   BELSOMRA 10 MG Tabs Generic drug:  Suvorexant Take 10 mg by mouth at bedtime.   cyclobenzaprine 10 MG tablet Commonly known as:  FLEXERIL Take 10 mg by mouth 3 (three) times daily.   gabapentin 300 MG capsule Commonly known as:  NEURONTIN Take 300 mg by mouth 3 (three) times daily.   hydrochlorothiazide 12.5 MG tablet Commonly known as:  HYDRODIURIL Take 1 tablet (12.5 mg total) by mouth daily. What changed:  medication strength  how much to take   ibuprofen 800 MG tablet Commonly known as:   ADVIL,MOTRIN Take 800 mg by mouth 3 (three) times daily.   metFORMIN 500 MG tablet Commonly known as:  GLUCOPHAGE Take 500 mg by mouth 2 (two) times daily with a meal.   Oxycodone HCl 10 MG Tabs Take 10 mg by mouth 4 (four) times daily.   PARoxetine 10 MG tablet Commonly known as:  PAXIL Take 10 mg by mouth daily.   potassium chloride SA 20 MEQ tablet Commonly known as:  K-DUR,KLOR-CON Take 1 tablet (20 mEq total) by mouth daily.            Discharge Care Instructions        Start     Ordered   02/04/17 0000  hydrochlorothiazide (HYDRODIURIL) 12.5 MG tablet  Daily     02/04/17 1411   02/04/17 0000  potassium chloride SA (K-DUR,KLOR-CON) 20 MEQ tablet  Daily     02/04/17 1411   02/04/17 0000  Increase activity slowly     02/04/17 1411   02/04/17 0000  Diet - low sodium heart healthy     02/04/17 1411   02/04/17 0000  Call MD for:  temperature >100.4     02/04/17 1411   02/04/17 0000  Call MD for:  persistant nausea and vomiting     02/04/17 1411   02/04/17 0000  Call MD for:  extreme fatigue     02/04/17 1411   02/04/17 0000  Call MD for:  persistant dizziness or light-headedness     02/04/17 1411   02/04/17 0000  Call MD for:  difficulty breathing, headache or visual disturbances     02/04/17 1411     Follow-up Information    [name]. Schedule an appointment as soon as possible for a visit in 1 week(s).   Specialty:  General Practice Why:  Hospital Follow Up  Contact information: [address] [place] [address] [phone]          No Known Allergies Current Discharge Medication List    CONTINUE these medications which have CHANGED   Details  hydrochlorothiazide (HYDRODIURIL) 12.5 MG tablet Take 1 tablet (12.5 mg total) by mouth daily. Qty: 30 tablet, Refills: 0    potassium chloride SA (K-DUR,KLOR-CON) 20 MEQ tablet Take 1 tablet (20 mEq total) by mouth daily. Qty: 30 tablet, Refills: 0      CONTINUE these medications  which have NOT CHANGED   Details  alendronate (FOSAMAX) 70 MG tablet Take 70 mg by mouth once a week. Take with a full glass of water on an empty stomach.    cyclobenzaprine (FLEXERIL) 10 MG tablet Take 10 mg by mouth 3 (three) times daily.     gabapentin (NEURONTIN) 300 MG capsule Take 300 mg by mouth 3 (three) times daily.  ibuprofen (ADVIL,MOTRIN) 800 MG tablet Take 800 mg by mouth 3 (three) times daily.    metFORMIN (GLUCOPHAGE) 500 MG tablet Take 500 mg by mouth 2 (two) times daily with a meal.     Oxycodone HCl 10 MG TABS Take 10 mg by mouth 4 (four) times daily.     Suvorexant (BELSOMRA) 10 MG TABS Take 10 mg by mouth at bedtime.    PARoxetine (PAXIL) 10 MG tablet Take 10 mg by mouth daily. Refills: 2      STOP taking these medications     furosemide (LASIX) 40 MG tablet         Procedures/Studies: Dg Chest 2 View  Result Date: 02/02/2017 CLINICAL DATA:  61 year old female with weakness and hypotension for 1 day. EXAM: CHEST  2 VIEW COMPARISON:  Chest radiographs 01/17/2015 and earlier. FINDINGS: Upright AP and lateral views of the chest. Lung volumes are stable and within normal limits. Mild eventration of the right hemidiaphragm is unchanged. Mediastinal contours are within normal limits. Visualized tracheal air column is within normal limits. No pneumothorax, pulmonary edema, pleural effusion or acute pulmonary opacity. There is chronic curvilinear anterior lung scarring more visible on the lateral view. No acute osseous abnormality identified. Negative visible bowel gas pattern. IMPRESSION: No acute cardiopulmonary abnormality. Electronically Signed   By: Genevie Ann M.D.   On: 02/02/2017 20:35      Subjective: Pt says she feels better and wants to go home.   Discharge Exam: Vitals:   02/04/17 0818 02/04/17 1116  BP: 102/60 (!) 101/56  Pulse: 70   Resp:    Temp: 97.9 F (36.6 C) 98.4 F (36.9 C)  SpO2:     Vitals:   02/04/17 0003 02/04/17 0452 02/04/17 0818  02/04/17 1116  BP: 111/61 119/82 102/60 (!) 101/56  Pulse: 79 79 70   Resp: 11 15    Temp: 97.8 F (36.6 C) 97.8 F (36.6 C) 97.9 F (36.6 C) 98.4 F (36.9 C)  TempSrc: Oral Oral Oral Oral  SpO2: 95% 97%    Weight:      Height:       General: Pt is alert, awake, not in acute distress Cardiovascular: RRR, S1/S2 +, no rubs, no gallops Respiratory: CTA bilaterally, no wheezing, no rhonchi Abdominal: Soft, NT, ND, bowel sounds + Extremities: no edema, no cyanosis   The results of significant diagnostics from this hospitalization (including imaging, microbiology, ancillary and laboratory) are listed below for reference.     Microbiology: Recent Results (from the past 240 hour(s))  Culture, blood (Routine X 2) w Reflex to ID Panel     Status: None (Preliminary result)   Collection Time: 02/02/17  7:46 PM  Result Value Ref Range Status   Specimen Description BLOOD RIGHT ANTECUBITAL  Final   Special Requests   Final    BOTTLES DRAWN AEROBIC AND ANAEROBIC Blood Culture adequate volume   Culture NO GROWTH 2 DAYS  Final   Report Status PENDING  Incomplete  Culture, blood (Routine X 2) w Reflex to ID Panel     Status: None (Preliminary result)   Collection Time: 02/02/17  8:47 PM  Result Value Ref Range Status   Specimen Description BLOOD LEFT ANTECUBITAL  Final   Special Requests   Final    BOTTLES DRAWN AEROBIC AND ANAEROBIC Blood Culture adequate volume   Culture NO GROWTH 1 DAY  Final   Report Status PENDING  Incomplete  MRSA PCR Screening     Status: None  Collection Time: 02/03/17  4:06 AM  Result Value Ref Range Status   MRSA by PCR NEGATIVE NEGATIVE Final    Comment:        The GeneXpert MRSA Assay (FDA approved for NASAL specimens only), is one component of a comprehensive MRSA colonization surveillance program. It is not intended to diagnose MRSA infection nor to guide or monitor treatment for MRSA infections.      Labs: BNP (last 3 results) No results for  input(s): BNP in the last 8760 hours. Basic Metabolic Panel:  Recent Labs Lab 02/02/17 1913 02/02/17 2357 02/03/17 0428 02/03/17 0541 02/03/17 1331 02/04/17 0358 02/04/17 1143  NA 130*  --  131*  --  133* 134* 134*  K <2.0*  --  <2.0*  --  2.5* 2.7* 3.5  CL 88*  --  94*  --  97* 102 107  CO2 25  --  26  --  25 24 19*  GLUCOSE 193*  --  125*  --  133* 133* 163*  BUN 20  --  21*  --  20 15 14   CREATININE 1.61*  --  1.14*  --  1.05* 0.77 0.88  CALCIUM 9.8  --  8.5*  --  8.4* 8.0* 7.9*  MG  --  2.1  --  2.2  --  2.6*  --   PHOS  --  3.4 2.7  --   --   --   --    Liver Function Tests:  Recent Labs Lab 02/02/17 2357 02/03/17 0428  AST 27 25  ALT 26 25  ALKPHOS 86 89  BILITOT 0.6 0.6  PROT 6.3* 6.0*  ALBUMIN 3.3* 3.2*   No results for input(s): LIPASE, AMYLASE in the last 168 hours. No results for input(s): AMMONIA in the last 168 hours. CBC:  Recent Labs Lab 02/02/17 1913 02/03/17 0428 02/03/17 0541  WBC 14.2* 11.3* 11.6*  NEUTROABS  --   --  7.5  HGB 17.1* 13.9 14.3  HCT 45.4 38.9 39.4  MCV 83.2 83.3 83.8  PLT 299 264 241   Cardiac Enzymes:  Recent Labs Lab 02/02/17 2357 02/03/17 0428 02/03/17 1111  CKTOTAL  --  88  --   TROPONINI 0.04* 0.05* 0.03*   BNP: Invalid input(s): POCBNP CBG:  Recent Labs Lab 02/03/17 1630 02/03/17 2011 02/04/17 0814 02/04/17 1157 02/04/17 1405  GLUCAP 158* 137* 136* 172* 125*   D-Dimer  Recent Labs  02/02/17 2357  DDIMER 0.32   Hgb A1c  Recent Labs  02/03/17 0428  HGBA1C 6.5*   Lipid Profile No results for input(s): CHOL, HDL, LDLCALC, TRIG, CHOLHDL, LDLDIRECT in the last 72 hours. Thyroid function studies  Recent Labs  02/03/17 0428  TSH 1.558   Anemia work up No results for input(s): VITAMINB12, FOLATE, FERRITIN, TIBC, IRON, RETICCTPCT in the last 72 hours. Urinalysis    Component Value Date/Time   COLORURINE YELLOW 02/02/2017 2339   APPEARANCEUR CLOUDY (A) 02/02/2017 2339   LABSPEC 1.021  02/02/2017 2339   PHURINE 5.0 02/02/2017 2339   GLUCOSEU NEGATIVE 02/02/2017 2339   HGBUR NEGATIVE 02/02/2017 2339   BILIRUBINUR NEGATIVE 02/02/2017 2339   KETONESUR NEGATIVE 02/02/2017 2339   PROTEINUR 100 (A) 02/02/2017 2339   UROBILINOGEN 0.2 07/03/2007 1114   NITRITE NEGATIVE 02/02/2017 2339   LEUKOCYTESUR SMALL (A) 02/02/2017 2339   Sepsis Labs Invalid input(s): PROCALCITONIN,  WBC,  LACTICIDVEN Microbiology Recent Results (from the past 240 hour(s))  Culture, blood (Routine X 2) w Reflex to ID Panel  Status: None (Preliminary result)   Collection Time: 02/02/17  7:46 PM  Result Value Ref Range Status   Specimen Description BLOOD RIGHT ANTECUBITAL  Final   Special Requests   Final    BOTTLES DRAWN AEROBIC AND ANAEROBIC Blood Culture adequate volume   Culture NO GROWTH 2 DAYS  Final   Report Status PENDING  Incomplete  Culture, blood (Routine X 2) w Reflex to ID Panel     Status: None (Preliminary result)   Collection Time: 02/02/17  8:47 PM  Result Value Ref Range Status   Specimen Description BLOOD LEFT ANTECUBITAL  Final   Special Requests   Final    BOTTLES DRAWN AEROBIC AND ANAEROBIC Blood Culture adequate volume   Culture NO GROWTH 1 DAY  Final   Report Status PENDING  Incomplete  MRSA PCR Screening     Status: None   Collection Time: 02/03/17  4:06 AM  Result Value Ref Range Status   MRSA by PCR NEGATIVE NEGATIVE Final    Comment:        The GeneXpert MRSA Assay (FDA approved for NASAL specimens only), is one component of a comprehensive MRSA colonization surveillance program. It is not intended to diagnose MRSA infection nor to guide or monitor treatment for MRSA infections.    Time coordinating discharge: 34 minutes  SIGNED:  Irwin Brakeman, MD  [facility] 02/04/2017, 2:15 PM Pager [phone]  If 7PM-7AM, please contact night-coverage [email or web address] Password [email or web address]

## 2017-02-04 NOTE — Evaluation (Signed)
Occupational Therapy Evaluation Patient Details Name: Madison Wells MRN: [id number] DOB: Apr 24, 1956 Today's Date: 02/04/2017    History of Present Illness Madison Wells is a 61 y.o. female with medical history significant of hypertension, B12 deficiency, headaches, diabetes, mood disorder, followed by psychiatry who presented with severe dehydration and severe hypokalemia   Clinical Impression   Pt at baseline independent level of function with ADLs and ADL mobility. All education completed and no further acute OT indicated    Follow Up Recommendations  No OT follow up    Equipment Recommendations  None recommended by OT    Recommendations for Other Services       Precautions / Restrictions Precautions Precautions: Fall Restrictions Weight Bearing Restrictions: No      Mobility Bed Mobility Overal bed mobility: Independent                Transfers Overall transfer level: Independent                    Balance                                           ADL either performed or assessed with clinical judgement   ADL Overall ADL's : Independent;At baseline                                             Vision Patient Visual Report: No change from baseline       Perception     Praxis      Pertinent Vitals/Pain Pain Assessment: 0-10     Hand Dominance Right   Extremity/Trunk Assessment Upper Extremity Assessment Upper Extremity Assessment: Overall WFL for tasks assessed   Lower Extremity Assessment Lower Extremity Assessment: Defer to PT evaluation   Cervical / Trunk Assessment Cervical / Trunk Assessment: Normal   Communication Communication Communication: No difficulties   Cognition Arousal/Alertness: Awake/alert Behavior During Therapy: WFL for tasks assessed/performed Overall Cognitive Status: Within Functional Limits for tasks assessed                                     General Comments   pt pleasant and cooperative               Home Living Family/patient expects to be discharged to:: Private residence Living Arrangements: Spouse/significant other Available Help at Discharge: Family;Available 24 hours/day Type of Home: Apartment Home Access: Stairs to enter Entrance Stairs-Number of Steps: 12 Entrance Stairs-Rails: Right Home Layout: One level     Bathroom Shower/Tub: Teacher, early years/pre: Standard     Home Equipment: Cane - single point          Prior Functioning/Environment Level of Independence: Independent with assistive device(s)                 OT Problem List: Decreased activity tolerance      OT Treatment/Interventions:      OT Goals(Current goals can be found in the care plan section) Acute Rehab OT Goals Patient Stated Goal: to go home OT Goal Formulation: With patient  OT Frequency:     Barriers to D/C:    no barriers  Co-evaluation              AM-PAC PT "6 Clicks" Daily Activity     Outcome Measure Help from another person eating meals?: None Help from another person taking care of personal grooming?: None Help from another person toileting, which includes using toliet, bedpan, or urinal?: None Help from another person bathing (including washing, rinsing, drying)?: None Help from another person to put on and taking off regular upper body clothing?: None Help from another person to put on and taking off regular lower body clothing?: None 6 Click Score: 24   End of Session    Activity Tolerance: Patient tolerated treatment well Patient left: in bed (sitting EOB)  OT Visit Diagnosis: Muscle weakness (generalized) (M62.81)                Time: 7867-6720 OT Time Calculation (min): 24 min Charges:  OT General Charges $OT Visit: 1 Visit OT Evaluation $OT Eval Low Complexity: 1 Low G-Codes: OT G-codes **NOT FOR INPATIENT CLASS** Functional Assessment Tool Used: AM-PAC 6  Clicks Daily Activity Functional Limitation: Self care Self Care Current Status (N4709): 0 percent impaired, limited or restricted Self Care Goal Status (G2836): 0 percent impaired, limited or restricted Self Care Discharge Status (O2947): 0 percent impaired, limited or restricted     Britt Bottom 02/04/2017, 12:49 PM

## 2017-02-04 NOTE — Progress Notes (Signed)
Discharge instructions given to patient, all questions answered at this time.  Pt. VSS with no s/s of distress noted.  Patient stable at discharge.  Belongings with patient.

## 2017-02-04 NOTE — Progress Notes (Signed)
CRITICAL VALUE ALERT  Critical Value:  K 2.7  Date & Time Notied:  02/04/17 0755  Provider Notified: Dr. Irwin Brakeman  Orders Received/Actions taken: Orders placed by MD

## 2017-02-07 LAB — CULTURE, BLOOD (ROUTINE X 2)
Culture: NO GROWTH
Special Requests: ADEQUATE

## 2017-02-08 ENCOUNTER — Encounter (HOSPITAL_COMMUNITY): Payer: Self-pay

## 2017-02-08 DIAGNOSIS — F17210 Nicotine dependence, cigarettes, uncomplicated: Secondary | ICD-10-CM | POA: Diagnosis not present

## 2017-02-08 DIAGNOSIS — Z7984 Long term (current) use of oral hypoglycemic drugs: Secondary | ICD-10-CM | POA: Diagnosis not present

## 2017-02-08 DIAGNOSIS — E119 Type 2 diabetes mellitus without complications: Secondary | ICD-10-CM | POA: Insufficient documentation

## 2017-02-08 DIAGNOSIS — Z7982 Long term (current) use of aspirin: Secondary | ICD-10-CM | POA: Insufficient documentation

## 2017-02-08 DIAGNOSIS — E876 Hypokalemia: Secondary | ICD-10-CM | POA: Diagnosis not present

## 2017-02-08 DIAGNOSIS — Z79899 Other long term (current) drug therapy: Secondary | ICD-10-CM | POA: Diagnosis not present

## 2017-02-08 DIAGNOSIS — R2243 Localized swelling, mass and lump, lower limb, bilateral: Secondary | ICD-10-CM | POA: Insufficient documentation

## 2017-02-08 DIAGNOSIS — I10 Essential (primary) hypertension: Secondary | ICD-10-CM | POA: Insufficient documentation

## 2017-02-08 LAB — URINALYSIS, ROUTINE W REFLEX MICROSCOPIC
BILIRUBIN URINE: NEGATIVE
Glucose, UA: NEGATIVE mg/dL
HGB URINE DIPSTICK: NEGATIVE
Ketones, ur: NEGATIVE mg/dL
LEUKOCYTES UA: NEGATIVE
NITRITE: NEGATIVE
PH: 6 (ref 5.0–8.0)
Protein, ur: 30 mg/dL — AB
SPECIFIC GRAVITY, URINE: 1.02 (ref 1.005–1.030)

## 2017-02-08 LAB — COMPREHENSIVE METABOLIC PANEL
ALBUMIN: 3.2 g/dL — AB (ref 3.5–5.0)
ALK PHOS: 83 U/L (ref 38–126)
ALT: 23 U/L (ref 14–54)
AST: 20 U/L (ref 15–41)
Anion gap: 6 (ref 5–15)
BILIRUBIN TOTAL: 0.4 mg/dL (ref 0.3–1.2)
BUN: 7 mg/dL (ref 6–20)
CALCIUM: 10.2 mg/dL (ref 8.9–10.3)
CO2: 30 mmol/L (ref 22–32)
CREATININE: 0.62 mg/dL (ref 0.44–1.00)
Chloride: 106 mmol/L (ref 101–111)
GFR calc Af Amer: >60 mL/min (ref >60)
GFR calc non Af Amer: >60 mL/min (ref >60)
GLUCOSE: 109 mg/dL — AB (ref 65–99)
Potassium: 3.2 mmol/L — ABNORMAL LOW (ref 3.5–5.1)
Sodium: 142 mmol/L (ref 135–145)
TOTAL PROTEIN: 5.9 g/dL — AB (ref 6.5–8.1)

## 2017-02-08 LAB — CBC WITH DIFFERENTIAL/PLATELET
BASOS ABS: 0 10*3/uL (ref 0.0–0.1)
BASOS PCT: 0 %
EOS PCT: 1 %
Eosinophils Absolute: 0.1 10*3/uL (ref 0.0–0.7)
HCT: 37.8 % (ref 36.0–46.0)
Hemoglobin: 12.6 g/dL (ref 12.0–15.0)
Lymphocytes Relative: 24 %
Lymphs Abs: 2.1 10*3/uL (ref 0.7–4.0)
MCH: 29.8 pg (ref 26.0–34.0)
MCHC: 33.3 g/dL (ref 30.0–36.0)
MCV: 89.4 fL (ref 78.0–100.0)
MONO ABS: 0.6 10*3/uL (ref 0.1–1.0)
MONOS PCT: 7 %
Neutro Abs: 5.9 10*3/uL (ref 1.7–7.7)
Neutrophils Relative %: 68 %
PLATELETS: 293 10*3/uL (ref 150–400)
RBC: 4.23 MIL/uL (ref 3.87–5.11)
RDW: 14.9 % (ref 11.5–15.5)
WBC: 8.7 10*3/uL (ref 4.0–10.5)

## 2017-02-08 LAB — CULTURE, BLOOD (ROUTINE X 2)
Culture: NO GROWTH
SPECIAL REQUESTS: ADEQUATE

## 2017-02-08 LAB — I-STAT CG4 LACTIC ACID, ED: Lactic Acid, Venous: 1.14 mmol/L (ref 0.5–1.9)

## 2017-02-08 NOTE — ED Triage Notes (Signed)
Pt states that she was hospitalized recently for K levels, left on Friday, on Sat started to have swelling redness and warmth to the L lower foot. Denies fevers.

## 2017-02-09 ENCOUNTER — Emergency Department (HOSPITAL_BASED_OUTPATIENT_CLINIC_OR_DEPARTMENT_OTHER): Payer: Medicaid Other

## 2017-02-09 ENCOUNTER — Emergency Department (HOSPITAL_COMMUNITY)
Admission: EM | Admit: 2017-02-09 | Discharge: 2017-02-09 | Disposition: A | Discharge status: Home / Self Care | Payer: Medicaid Other | Attending: Emergency Medicine | Admitting: Emergency Medicine

## 2017-02-09 DIAGNOSIS — M7989 Other specified soft tissue disorders: Secondary | ICD-10-CM

## 2017-02-09 DIAGNOSIS — E876 Hypokalemia: Secondary | ICD-10-CM

## 2017-02-09 MED ORDER — MAGNESIUM OXIDE 400 (241.3 MG) MG PO TABS
400.0000 mg | ORAL_TABLET | Freq: Once | ORAL | Status: AC
  Administered 2017-02-09: 400 mg via ORAL
  Filled 2017-02-09: qty 1

## 2017-02-09 MED ORDER — POTASSIUM CHLORIDE CRYS ER 20 MEQ PO TBCR
40.0000 meq | EXTENDED_RELEASE_TABLET | Freq: Once | ORAL | Status: AC
  Administered 2017-02-09: 40 meq via ORAL
  Filled 2017-02-09: qty 2

## 2017-02-09 MED ORDER — MAGNESIUM OXIDE 400 (241.3 MG) MG PO TABS: 400.0000 mg | ORAL_TABLET | Freq: Every day | ORAL | 0 refills | Status: DC

## 2017-02-09 MED ORDER — OXYCODONE-ACETAMINOPHEN 5-325 MG PO TABS
ORAL_TABLET | ORAL | Status: DC
Start: 2017-02-09 — End: 2017-02-09
  Filled 2017-02-09: qty 1

## 2017-02-09 MED ORDER — FUROSEMIDE 20 MG PO TABS
80.0000 mg | ORAL_TABLET | Freq: Once | ORAL | Status: AC
  Administered 2017-02-09: 80 mg via ORAL
  Filled 2017-02-09: qty 4

## 2017-02-09 MED ORDER — KETOROLAC TROMETHAMINE 60 MG/2ML IM SOLN
60.0000 mg | Freq: Once | INTRAMUSCULAR | Status: AC
  Administered 2017-02-09: 60 mg via INTRAMUSCULAR
  Filled 2017-02-09: qty 2

## 2017-02-09 MED ORDER — OXYCODONE-ACETAMINOPHEN 5-325 MG PO TABS
1.0000 | ORAL_TABLET | ORAL | Status: DC | PRN
  Administered 2017-02-09: 1 via ORAL

## 2017-02-09 NOTE — Discharge Instructions (Signed)
Please take the Lasix once a day for the next 3 days, have your family Dr. follow-up your blood work within 2 days for a recheck. Your blood potassium level was only slightly low.  Most of the fluid that is on your body is likely related to your recent admission to the hospital as well as being taken off the Lasix.  You should return to the emergency department for severe or worsening pain swelling or difficulty breathing. Your ultrasound does not show a blood clot however if your swelling persists he will need to have a repeat ultrasound within 2 weeks.

## 2017-02-09 NOTE — ED Provider Notes (Signed)
[facility] DEPT Provider Note   CSN: [id number] Arrival date & time: 02/08/17  2151     History   Chief Complaint Chief Complaint  Patient presents with  . Cellulitis    HPI Madison Wells is a 61 y.o. female.  HPI  The patient is an obese 61 year old female, history of recent admission to the hospital for hypokalemia during which time she states she received lots of IV fluids, IV replacement potassium because of that severely depressed potassium level of less than 2. She spent several days in the hospital, improved and was released and told not to take her Lasix anymore. Over the last several days she has had increased swelling mostly of her legs but also of her upper extremities. Her left leg seems to be more swollen than the right leg. She did take a dose of Lasix last night despite being told not to at discharge because she thought she was retaining fluid. She denies any other symptoms including coughing shortness of breath chest pain or fevers. She is concerned because her left leg is more swollen than the right but also feels some swelling in her arms.  Past Medical History:  Diagnosis Date  . Angina   . Anxiety   . Bipolar 1 disorder ([place])   . Cervicalgia   . Chronic pain syndrome   . Diabetes mellitus without complication ([place])   . Disturbance of skin sensation   . Dizziness and giddiness   . Enthesopathy of hip region   . Headache   . Hypertension   . Lumbago   . Obesity   . Pain in joint, hand     Patient Active Problem List   Diagnosis Date Noted  . Elevated troponin 02/03/2017  . UTI (urinary tract infection) 02/03/2017  . AKI (acute kidney injury) ([place]) 02/02/2017  . Dehydration 02/02/2017  . Leukocytosis 02/02/2017  . DM (diabetes mellitus), type 2 ([place]) 02/02/2017  . Hypokalemia 02/02/2017  . Elevated lactic acid level 02/02/2017  . Hyponatremia 02/02/2017  . Right Achilles tendinitis 11/29/2011  . Bilateral knee pain 09/13/2011  . Low back  pain 09/13/2011  . Carpal tunnel syndrome 08/13/2011  . Leg swelling 04/21/2011  . Obesity   . Hypertension   . Anxiety   . Bipolar 1 disorder [facility])     Past Surgical History:  Procedure Laterality Date  . CESAREAN SECTION    . KNEE ARTHROPLASTY    . TONSILLECTOMY      OB History    Gravida Para Term Preterm AB Living   4 4 4     4    SAB TAB Ectopic Multiple Live Births                   Home Medications    Prior to Admission medications   Medication Sig Start Date End Date Taking? Authorizing Provider  aspirin EC 81 MG tablet Take 81 mg by mouth daily.   Yes [provider]  cyclobenzaprine (FLEXERIL) 10 MG tablet Take 10 mg by mouth 3 (three) times daily.    Yes [provider]  gabapentin (NEURONTIN) 300 MG capsule Take 300 mg by mouth 3 (three) times daily.   Yes [provider]  hydrochlorothiazide (HYDRODIURIL) 12.5 MG tablet Take 1 tablet (12.5 mg total) by mouth daily. 02/04/17 03/06/17 Yes Johnson, Clanford L, MD  ibuprofen (ADVIL,MOTRIN) 800 MG tablet Take 800 mg by mouth 3 (three) times daily.   Yes [provider]  metFORMIN (GLUCOPHAGE) 500 MG  tablet Take 500 mg by mouth 2 (two) times daily with a meal.    Yes [provider]  Oxycodone HCl 10 MG TABS Take 10 mg by mouth 4 (four) times daily.    Yes [provider]  PARoxetine (PAXIL) 10 MG tablet Take 10 mg by mouth daily. 11/02/16  Yes [provider]  potassium chloride SA (K-DUR,KLOR-CON) 20 MEQ tablet Take 1 tablet (20 mEq total) by mouth daily. 02/04/17 03/06/17 Yes Johnson, Clanford L, MD  Suvorexant (BELSOMRA) 10 MG TABS Take 10 mg by mouth at bedtime.   Yes [provider]  alendronate (FOSAMAX) 70 MG tablet Take 70 mg by mouth once a week. Take with a full glass of water on an empty stomach.    [provider]  magnesium oxide (MAG-OX) 400 (241.3 Mg) MG tablet Take 1 tablet (400 mg total) by mouth daily. 02/09/17   Noemi Chapel, MD    Family History Family History  Problem Relation Age of Onset  . Alcohol abuse Mother   . Alcohol abuse Father   . Alcohol abuse Sister   . Alcohol abuse Brother     Social History Social History  Substance Use Topics  . Smoking status: Current Every Day Smoker    Packs/day: 0.50    Years: 40.00    Types: Cigarettes  . Smokeless tobacco: Never Used  . Alcohol use No     Allergies   Patient has no known allergies.   Review of Systems Review of Systems  All other systems reviewed and are negative.    Physical Exam Updated Vital Signs BP 138/73   Pulse 84   Temp 97.7 F (36.5 C) (Oral)   Resp 16   Ht 5\' 5"  (1.651 m)   Wt 125.6 kg (277 lb)   LMP 08/13/2011   SpO2 100%   BMI 46.10 kg/m   Physical Exam  Constitutional: She appears well-developed and well-nourished. No distress.  HENT:  Head: Normocephalic and atraumatic.  Mouth/Throat: Oropharynx is clear and moist. No oropharyngeal exudate.  Eyes: Pupils are equal, round, and reactive to light. Conjunctivae and EOM are normal. Right eye exhibits no discharge. Left eye exhibits no discharge. No scleral icterus.  Neck: Normal range of motion. Neck supple. No JVD present. No thyromegaly present.  Cardiovascular: Normal rate, regular rhythm, normal heart sounds and intact distal pulses.  Exam reveals no gallop and no friction rub.   No murmur heard. Pulmonary/Chest: Effort normal and breath sounds normal. No respiratory distress. She has no wheezes. She has no rales.  Abdominal: Soft. Bowel sounds are normal. She exhibits no distension and no mass. There is no tenderness.  Musculoskeletal: Normal range of motion. She exhibits edema. She exhibits no tenderness.  There is minimal swelling of the bilateral upper extremities, normal range of motion of the fingers. Swelling of the left lower extremity greater than the right lower extremity with pitting edema from the knee to the foot. There is also mild  pitting edema to the right lower extremity. Supple joints, soft compartments  Lymphadenopathy:    She has no cervical adenopathy.  Neurological: She is alert. Coordination normal.  Skin: Skin is warm and dry. No rash noted. No erythema.  Normal color of the skin, no erythema induration redness or warmth.  Psychiatric: She has a normal mood and affect. Her behavior is normal.  Nursing note and vitals reviewed.    ED Treatments / Results  Labs (all labs ordered are listed, but only  abnormal results are displayed) Labs Reviewed  COMPREHENSIVE METABOLIC PANEL - Abnormal; Notable for the following:       Result Value   Potassium 3.2 (*)    Glucose, Bld 109 (*)    Total Protein 5.9 (*)    Albumin 3.2 (*)    All other components within normal limits  URINALYSIS, ROUTINE W REFLEX MICROSCOPIC - Abnormal; Notable for the following:    APPearance HAZY (*)    Protein, ur 30 (*)    Bacteria, UA RARE (*)    Squamous Epithelial / LPF 6-30 (*)    All other components within normal limits  CBC WITH DIFFERENTIAL/PLATELET  I-STAT CG4 LACTIC ACID, ED  I-STAT CG4 LACTIC ACID, ED    Radiology No results found.  Procedures Procedures (including critical care time)  Medications Ordered in ED Medications  oxyCODONE-acetaminophen (PERCOCET/ROXICET) 5-325 MG per tablet 1 tablet (1 tablet Oral Given 02/09/17 0442)  furosemide (LASIX) tablet 80 mg (not administered)  potassium chloride SA (K-DUR,KLOR-CON) CR tablet 40 mEq (not administered)  magnesium oxide (MAG-OX) tablet 400 mg (not administered)  ketorolac (TORADOL) injection 60 mg (not administered)     Initial Impression / Assessment and Plan / ED Course  I have reviewed the triage vital signs and the nursing notes.  Pertinent labs & imaging results that were available during my care of the patient were reviewed by me and considered in my medical decision making (see chart for details).     I suspect that the patient's edema is related  to third spacing fluids that she had while she was an inpatient, lack of taking Lasix however she has unilateral swelling which is going to need an ultrasound to rule out DVT.  Ultrasound negative, magnesium, potassium given, Lasix given, patient will be given magnesium for home and encouraged to take only 3 days of Lasix. She expressed her understanding. She is stable for discharge.  Final Clinical Impressions(s) / ED Diagnoses   Final diagnoses:  Swelling of left lower extremity  Hypokalemia    New Prescriptions New Prescriptions   MAGNESIUM OXIDE (MAG-OX) 400 (241.3 MG) MG TABLET    Take 1 tablet (400 mg total) by mouth daily.     Noemi Chapel, MD 02/09/17 [phone]

## 2017-02-09 NOTE — Progress Notes (Signed)
Preliminary results by tech - Left Lower Ext. Venous Duplex Completed. Negative for deep and superficial vein thrombosis.  Micco Bourbeau, BS, RDMS, RVT  

## 2017-02-09 NOTE — ED Notes (Signed)
Pt requesting pain medication.  

## 2017-02-22 ENCOUNTER — Ambulatory Visit (INDEPENDENT_AMBULATORY_CARE_PROVIDER_SITE_OTHER): Payer: Medicaid Other | Admitting: Cardiovascular Disease

## 2017-02-22 ENCOUNTER — Encounter: Payer: Self-pay | Admitting: Cardiovascular Disease

## 2017-02-22 VITALS — BP 134/77 | HR 68 | Ht 65.0 in | Wt 288.2 lb

## 2017-02-22 DIAGNOSIS — I872 Venous insufficiency (chronic) (peripheral): Secondary | ICD-10-CM

## 2017-02-22 NOTE — Progress Notes (Signed)
Cardiology Office Note   Date:  02/22/2017   ID:  Madison Wells, Madison Wells 08-16-1955, MRN [id number]  PCP:  [name]  Cardiologist:   Kathlyn Sacramento, MD   No chief complaint on file.     History of Present Illness: Madison Wells is a 61 y.o. female who was referred by Dr. Laurance Flatten for evaluation of leg swelling. The patient has no prior cardiac history. She has known history of chronic leg edema, obesity, recent diabetes mellitus and prolonged history of tobacco use. She reports being on hydrochlorothiazide and furosemide for a long time. She was hospitalized last month at [facility] for significant dehydration and severe hypokalemia with a potassium of less than 2 and sodium of 130. It was felt that it was induced by diuretics. She had an echocardiogram done while hospitalized which showed normal LV systolic function and normal diastolic function. It was mentioned in the report that there was grade 2 diastolic dysfunction. However, the echo was reviewed by myself and I believe that diastolic function was completely normal with normal E/A ratio, normal a E/E prime ratio and normal atrial size. The swelling is always worse on the left side. She had lower extremity venous duplex which showed no evidence of DVT. She denies any chest pain. She doesn't know her family history. She was recently switched to spironolactone but she was switched back to furosemide and hydrochlorothiazide.   Past Medical History:  Diagnosis Date  . Angina   . Anxiety   . Bipolar 1 disorder ([place])   . Cervicalgia   . Chronic pain syndrome   . Diabetes mellitus without complication ([place])   . Disturbance of skin sensation   . Dizziness and giddiness   . Enthesopathy of hip region   . Headache   . Hypertension   . Lumbago   . Obesity   . Pain in joint, hand     Past Surgical History:  Procedure Laterality Date  . CESAREAN SECTION    . KNEE ARTHROPLASTY    . TONSILLECTOMY       Current  Outpatient Prescriptions  Medication Sig Dispense Refill  . alendronate (FOSAMAX) 70 MG tablet Take 70 mg by mouth once a week. Take with a full glass of water on an empty stomach.    Marland Kitchen aspirin EC 81 MG tablet Take 81 mg by mouth daily.    . cyclobenzaprine (FLEXERIL) 10 MG tablet Take 10 mg by mouth 3 (three) times daily.     . furosemide (LASIX) 40 MG tablet Take 40 mg by mouth daily.  1  . gabapentin (NEURONTIN) 300 MG capsule Take 300 mg by mouth 3 (three) times daily.    . hydrochlorothiazide (HYDRODIURIL) 12.5 MG tablet Take 1 tablet (12.5 mg total) by mouth daily. 30 tablet 0  . ibuprofen (ADVIL,MOTRIN) 800 MG tablet Take 800 mg by mouth 3 (three) times daily.    . magnesium oxide (MAG-OX) 400 (241.3 Mg) MG tablet Take 1 tablet (400 mg total) by mouth daily. 30 tablet 0  . metFORMIN (GLUCOPHAGE) 500 MG tablet Take 500 mg by mouth 2 (two) times daily with a meal.     . Oxycodone HCl 10 MG TABS Take 10 mg by mouth 4 (four) times daily.     Marland Kitchen PARoxetine (PAXIL) 10 MG tablet Take 10 mg by mouth daily.  2  . potassium chloride SA (K-DUR,KLOR-CON) 20 MEQ tablet Take 1 tablet (20 mEq total) by mouth daily. 30 tablet 0   No  current facility-administered medications for this visit.     Allergies:   Patient has no known allergies.    Social History:  The patient  reports that she has been smoking Cigarettes.  She has a 20.00 pack-year smoking history. She has never used smokeless tobacco. She reports that she does not drink alcohol or use drugs.   Family History:  The patient's family history includes Alcohol abuse in her brother, father, mother, and sister.    ROS:  Please see the history of present illness.   Otherwise, review of systems are positive for none.   All other systems are reviewed and negative.    PHYSICAL EXAM: VS:  BP 134/77   Pulse 68   Ht 5\' 5"  (1.651 m)   Wt 288 lb 3.2 oz (130.7 kg)   LMP 08/13/2011   SpO2 95%   BMI 47.96 kg/m  , BMI Body mass index is 47.96  kg/m. GEN: Well nourished, well developed, in no acute distress  HEENT: normal  Neck: no JVD, carotid bruits, or masses Cardiac: RRR; no murmurs, rubs, or gallops, Respiratory:  clear to auscultation bilaterally, normal work of breathing GI: soft, nontender, nondistended, + BS MS: no deformity or atrophy  Skin: warm and dry, no rash Neuro:  Strength and sensation are intact Psych: euthymic mood, full affect Bilateral leg edema worse on the left side.  EKG:  EKG is not ordered today. Recent EKG was reviewed which showed sinus rhythm with poor R-wave progression in the anterior leads.   Recent Labs: 02/03/2017: TSH 1.558 02/04/2017: Magnesium 2.6 02/08/2017: ALT 23; BUN 7; Creatinine, Ser 0.62; Hemoglobin 12.6; Platelets 293; Potassium 3.2; Sodium 142    Lipid Panel No results found for: CHOL, TRIG, HDL, CHOLHDL, VLDL, LDLCALC, LDLDIRECT    Wt Readings from Last 3 Encounters:  02/22/17 288 lb 3.2 oz (130.7 kg)  02/08/17 277 lb (125.6 kg)  02/03/17 277 lb 12.5 oz (126 kg)       PAD Screen 02/22/2017  Previous PAD dx? No  Previous surgical procedure? No  Pain with walking? No  Feet/toe relief with dangling? No  Painful, non-healing ulcers? No  Extremities discolored? No      ASSESSMENT AND PLAN:  1.  Chronic venous insufficiency: I suspect that her leg edema is likely due to chronic venous insufficiency. I advised her to elevate her legs during the day. I suggested support stockings which she reports inability to place them on. She reports the need for ongoing diuretic therapy and for that I agree with a combination of furosemide and spironolactone Instead of hydrochlorothiazide. That is a good option given her recent severe hypokalemia and hyponatremia. Once she is switched to spironolactone, potassium supplement can be stopped or decreased. Echocardiogram does not support the diagnosis of heart failure. She had normal systolic and diastolic function.    Disposition:    FU with me as needed.  Signed,  Kathlyn Sacramento, MD  02/22/2017 11:59 AM    [place]

## 2017-02-22 NOTE — Patient Instructions (Signed)
Medication Instructions: Your physician recommends that you continue on your current medications as directed. Please refer to the Current Medication list given to you today.  If you need a refill on your cardiac medications before your next appointment, please call your pharmacy.    Follow-Up: Your physician wants you to follow-up as needed with Dr. Arida.   Thank you for choosing [facility]!!      

## 2017-06-24 LAB — HM DIABETES EYE EXAM

## 2017-09-22 ENCOUNTER — Encounter: Payer: Self-pay | Admitting: Podiatry

## 2017-09-22 ENCOUNTER — Ambulatory Visit: Payer: Medicaid Other | Admitting: Podiatry

## 2017-09-22 VITALS — BP 129/76 | HR 105

## 2017-09-22 DIAGNOSIS — L600 Ingrowing nail: Secondary | ICD-10-CM

## 2017-09-22 DIAGNOSIS — L309 Dermatitis, unspecified: Secondary | ICD-10-CM

## 2017-09-22 NOTE — Patient Instructions (Signed)

## 2017-09-23 NOTE — Progress Notes (Signed)
Subjective:   Patient ID: Madison Wells, female   DOB: 62 y.o.   MRN: [id number]   HPI Patient presents with severe ingrown toenails of both feet that makes it very difficult to wear shoes.  Patient states that she has tried to trim them and tried other modalities without relief and states that it is becoming very difficult for her to wear shoe gear in any fashion at the current time.  Patient smokes half pack per day and likes to be active   Review of Systems  All other systems reviewed and are negative.       Objective:  Physical Exam  Constitutional: She appears well-developed and well-nourished.  Cardiovascular: Intact distal pulses.  Pulmonary/Chest: Effort normal.  Musculoskeletal: Normal range of motion.  Neurological: She is alert.  Skin: Skin is warm.  Nursing note and vitals reviewed.   Neurovascular intact muscle strength adequate range of motion within normal limits.  Patient will bear growth and was found to have good digital perfusion and does have significant thickness of the hallux nailbed bilateral with incurvation of the borders with pain with palpation.  I did not note any drainage or active redness and patient is well oriented x3.  Patient also has dry skin that she had questions about     Assessment:  Severe ingrown toenail deformities involving the entire nailbeds hallux bilateral with incurvation of both corners curling     Plan:  H&P conditions reviewed and recommended at this time due to the intense discomfort to respond to pedicures and trimming herself along with soaks that we consider permanent nail removal.  Patient wants this and I allowed her to read consent form for permanent removal explained and the nail will not regrow and she is comfortable with this wants surgery.  I infiltrated each hallux 60 mg Xylocaine Marcaine mixture prep applied to each big toe and using sterile instrumentation the hallux nails were removed and chemical consisting of  phenol 5 applications 30 seconds each bed was applied followed by alcohol lavage sterile dressing.  Gave instructions on soaks and reappoint

## 2017-11-04 ENCOUNTER — Emergency Department (HOSPITAL_COMMUNITY): Payer: Medicaid Other

## 2017-11-04 ENCOUNTER — Encounter (HOSPITAL_COMMUNITY): Payer: Self-pay | Admitting: Nurse Practitioner

## 2017-11-04 ENCOUNTER — Emergency Department (HOSPITAL_COMMUNITY)
Admission: EM | Admit: 2017-11-04 | Discharge: 2017-11-05 | Disposition: A | Discharge status: Home / Self Care | Payer: Medicaid Other | Attending: Emergency Medicine | Admitting: Emergency Medicine

## 2017-11-04 DIAGNOSIS — F17210 Nicotine dependence, cigarettes, uncomplicated: Secondary | ICD-10-CM | POA: Diagnosis not present

## 2017-11-04 DIAGNOSIS — M25562 Pain in left knee: Secondary | ICD-10-CM | POA: Diagnosis not present

## 2017-11-04 DIAGNOSIS — Y929 Unspecified place or not applicable: Secondary | ICD-10-CM | POA: Insufficient documentation

## 2017-11-04 DIAGNOSIS — S8991XA Unspecified injury of right lower leg, initial encounter: Secondary | ICD-10-CM | POA: Insufficient documentation

## 2017-11-04 DIAGNOSIS — Z79899 Other long term (current) drug therapy: Secondary | ICD-10-CM | POA: Insufficient documentation

## 2017-11-04 DIAGNOSIS — Y9389 Activity, other specified: Secondary | ICD-10-CM | POA: Diagnosis not present

## 2017-11-04 DIAGNOSIS — W01198A Fall on same level from slipping, tripping and stumbling with subsequent striking against other object, initial encounter: Secondary | ICD-10-CM | POA: Diagnosis not present

## 2017-11-04 DIAGNOSIS — M25561 Pain in right knee: Secondary | ICD-10-CM | POA: Diagnosis not present

## 2017-11-04 DIAGNOSIS — G8911 Acute pain due to trauma: Secondary | ICD-10-CM | POA: Diagnosis not present

## 2017-11-04 DIAGNOSIS — Z7982 Long term (current) use of aspirin: Secondary | ICD-10-CM | POA: Diagnosis not present

## 2017-11-04 DIAGNOSIS — W19XXXA Unspecified fall, initial encounter: Secondary | ICD-10-CM

## 2017-11-04 DIAGNOSIS — Y999 Unspecified external cause status: Secondary | ICD-10-CM | POA: Diagnosis not present

## 2017-11-04 DIAGNOSIS — S8992XA Unspecified injury of left lower leg, initial encounter: Secondary | ICD-10-CM | POA: Diagnosis present

## 2017-11-04 DIAGNOSIS — I10 Essential (primary) hypertension: Secondary | ICD-10-CM | POA: Diagnosis not present

## 2017-11-04 DIAGNOSIS — E119 Type 2 diabetes mellitus without complications: Secondary | ICD-10-CM | POA: Insufficient documentation

## 2017-11-04 NOTE — ED Triage Notes (Signed)
Pt report that she fell around 0830 hrs today and landed on her bilateral knees which are now both bruised and blistered. Report pain 9/10

## 2017-11-05 NOTE — ED Notes (Signed)
Patient transported to CT 

## 2017-11-05 NOTE — ED Provider Notes (Signed)
[facility] DEPT Provider Note   CSN: [id number] Arrival date & time: 11/04/17  2045     History   Chief Complaint Chief Complaint  Patient presents with  . Knee Pain    Bilateral  . Fall    HPI Madison Wells is a 62 y.o. female.  Patient presents to the emergency department with a chief complaint of bilateral knee pain.  She states that she turned around suddenly this morning and lost her balance falling on both of her knees.  She complains of bilateral knee pain, left worse than right.  She has been able to ambulate, but states that it is painful.  She is concerned about the hardware in her knees.  She states that she is on a pain contract, and cannot take anything additional for pain.  She has tried using ice.  She denies any other injuries.  The history is provided by the patient. No language interpreter was used.    Past Medical History:  Diagnosis Date  . Angina   . Anxiety   . Bipolar 1 disorder ([place])   . Cervicalgia   . Chronic pain syndrome   . Diabetes mellitus without complication ([place])   . Disturbance of skin sensation   . Dizziness and giddiness   . Enthesopathy of hip region   . Headache   . Hypertension   . Lumbago   . Obesity   . Pain in joint, hand     Patient Active Problem List   Diagnosis Date Noted  . Elevated troponin 02/03/2017  . UTI (urinary tract infection) 02/03/2017  . AKI (acute kidney injury) ([place]) 02/02/2017  . Dehydration 02/02/2017  . Leukocytosis 02/02/2017  . DM (diabetes mellitus), type 2 ([place]) 02/02/2017  . Hypokalemia 02/02/2017  . Elevated lactic acid level 02/02/2017  . Hyponatremia 02/02/2017  . Right Achilles tendinitis 11/29/2011  . Bilateral knee pain 09/13/2011  . Low back pain 09/13/2011  . Carpal tunnel syndrome 08/13/2011  . Leg swelling 04/21/2011  . Obesity   . Hypertension   . Anxiety   . Bipolar 1 disorder [facility])     Past Surgical History:  Procedure Laterality  Date  . CESAREAN SECTION    . KNEE ARTHROPLASTY    . TONSILLECTOMY       OB History    Gravida  4   Para  4   Term  4   Preterm      AB      Living  4     SAB      TAB      Ectopic      Multiple      Live Births               Home Medications    Prior to Admission medications   Medication Sig Start Date End Date Taking? Authorizing Provider  alendronate (FOSAMAX) 70 MG tablet Take 70 mg by mouth once a week. Take with a full glass of water on an empty stomach.    [provider]  aspirin EC 81 MG tablet Take 81 mg by mouth daily.    [provider]  BELSOMRA 15 MG TABS TAKE 1 TABLET BY MOUTH PER NIGHT WITHIN 30 MINUTES OF BEDTIME ONLY IF 7 HOURS REMAIN BEFORE WAKING 08/25/17   [provider]  buPROPion (WELLBUTRIN XL) 150 MG 24 hr tablet Take 150 mg by mouth daily. 09/13/17   [provider]  cyclobenzaprine (FLEXERIL) 10 MG  tablet Take 10 mg by mouth 3 (three) times daily.     [provider]  furosemide (LASIX) 40 MG tablet Take 40 mg by mouth daily. 11/28/16   [provider]  gabapentin (NEURONTIN) 300 MG capsule Take 300 mg by mouth 3 (three) times daily.    [provider]  hydrochlorothiazide (HYDRODIURIL) 12.5 MG tablet Take 1 tablet (12.5 mg total) by mouth daily. 02/04/17 03/06/17  Johnson, Clanford L, MD  ibuprofen (ADVIL,MOTRIN) 800 MG tablet Take 800 mg by mouth 3 (three) times daily.    [provider]  ketoconazole (NIZORAL) 2 % shampoo APPLY DAILY, LATHER, LEAVE IN PLACE FOR 5 MINUTES THEN RINSE OFF WITH WATER 09/13/17   [provider]  magnesium oxide (MAG-OX) 400 (241.3 Mg) MG tablet Take 1 tablet (400 mg total) by mouth daily. Patient not taking: Reported on 09/22/2017 02/09/17   Noemi Chapel, MD  meloxicam (MOBIC) 15 MG tablet TAKE 1 TABLET BY MOUTH A DAY 09/10/17   [provider]  metFORMIN (GLUCOPHAGE) 500 MG tablet Take 500 mg by mouth 2 (two) times daily with  a meal.     [provider]  mometasone (ELOCON) 0.1 % cream APPLY TO AFFECTED AREA EVERY DAY A THIN LAYER 09/13/17   [provider]  Oxycodone HCl 10 MG TABS Take 10 mg by mouth 4 (four) times daily.     [provider]  PARoxetine (PAXIL) 10 MG tablet Take 10 mg by mouth daily. 11/02/16   [provider]  potassium chloride SA (K-DUR,KLOR-CON) 20 MEQ tablet Take 1 tablet (20 mEq total) by mouth daily. 02/04/17 03/06/17  Murlean Iba, MD    Family History Family History  Problem Relation Age of Onset  . Alcohol abuse Mother   . Alcohol abuse Father   . Alcohol abuse Sister   . Alcohol abuse Brother     Social History Social History   Tobacco Use  . Smoking status: Current Every Day Smoker    Packs/day: 0.50    Years: 40.00    Pack years: 20.00    Types: Cigarettes  . Smokeless tobacco: Never Used  Substance Use Topics  . Alcohol use: No    Alcohol/week: 0.0 oz  . Drug use: No     Allergies   Patient has no known allergies.   Review of Systems Review of Systems  All other systems reviewed and are negative.    Physical Exam Updated Vital Signs BP 119/64 (BP Location: Left Arm)   Pulse 94   Temp 97.9 F (36.6 C)   Resp 14   LMP 08/13/2011   SpO2 94%   Physical Exam  Constitutional: She is oriented to person, place, and time. She appears well-developed and well-nourished.  HENT:  Head: Normocephalic and atraumatic.  Eyes: Pupils are equal, round, and reactive to light. Conjunctivae and EOM are normal.  Neck: Normal range of motion. Neck supple.  Cardiovascular: Normal rate and regular rhythm. Exam reveals no gallop and no friction rub.  No murmur heard. Pulmonary/Chest: Effort normal and breath sounds normal. No respiratory distress. She has no wheezes. She has no rales. She exhibits no tenderness.  Abdominal: Soft. Bowel sounds are normal. She exhibits no distension and no mass. There is no tenderness. There is no  rebound and no guarding.  Musculoskeletal: Normal range of motion. She exhibits no edema or tenderness.  ROM and strength 5/5 No bony deformity  Neurological: She is alert and oriented to person, place, and  time.  Skin: Skin is warm and dry.  Extensive ecchymosis about bilateral knees anteriorly  Psychiatric: She has a normal mood and affect. Her behavior is normal. Judgment and thought content normal.  Nursing note and vitals reviewed.    ED Treatments / Results  Labs (all labs ordered are listed, but only abnormal results are displayed) Labs Reviewed - No data to display  EKG None  Radiology Ct Knee Left Wo Contrast  Result Date: 11/05/2017 CLINICAL DATA:  Fall with significant knee pain EXAM: CT OF THE left KNEE WITHOUT CONTRAST TECHNIQUE: Multidetector CT imaging of the left knee was performed according to the standard protocol. Multiplanar CT image reconstructions were also generated. COMPARISON:  Radiograph 11/04/2017, CT 08/25/2010 FINDINGS: Bones/Joint/Cartilage Distal femur and patella are intact. No definite acute displaced fracture is visualized. Chronic mildly depressed lateral tibial plateau fracture. Articular surface defect, lateral aspect of the proximal tibia, contiguous with linear lucency in the proximal tibia, fell consistent with old hardware tract. Two screw fixation of the proximal tibia. Proximal fibula shows no fracture. No dislocation. Tibial spine osteophytosis. Moderate arthritis of the medial compartment. Mild arthritis of the patellofemoral compartment. No large knee effusion. Minimal lateral tilt of the patella. Ligaments Suboptimally assessed by CT. Muscles and Tendons Normal muscle caliber. No intramuscular fluid collections. Patellar tendon and quadriceps tendon appear intact. Soft tissues Generalized soft tissue swelling. 17 x 39 mm hematoma in the proximal pretibial subcutaneous soft tissues, partially visualized. IMPRESSION: 1. No definite acute fracture is  seen. 2. Screw fixation of proximal tibia with old lateral tibial plateau fracture and similar 3 mm depression as compared with prior CT. 3. Diffuse soft tissue swelling. Partially visualized 39 mm soft tissue hematoma in the pretibial soft tissues. Electronically Signed   By: Donavan Foil M.D.   On: 11/05/2017 01:47   Dg Knee Complete 4 Views Left  Result Date: 11/04/2017 CLINICAL DATA:  Fall with bruising EXAM: LEFT KNEE - COMPLETE 4+ VIEW COMPARISON:  08/23/2010 FINDINGS: Two screw fixation of the proximal tibia. Possible minimally depressed lateral tibial plateau fracture. Mild degenerative change of the medial and patellofemoral compartments. Small knee effusion. IMPRESSION: Postsurgical changes of the proximal tibia. Possible minimally depressed fracture at the lateral tibial plateau. Electronically Signed   By: Donavan Foil M.D.   On: 11/04/2017 22:47   Dg Knee Complete 4 Views Right  Result Date: 11/04/2017 CLINICAL DATA:  Right knee pain after fall.   Bruising. EXAM: RIGHT KNEE - COMPLETE 4+ VIEW COMPARISON:  Radiographs 07/31/2011 FINDINGS: No acute fracture or dislocation. Tricompartmental osteoarthritis with tricompartmental spurring, progressed from prior exam. Mild medial tibiofemoral joint space narrowing. Small joint effusion without lipohemarthrosis. Soft tissue edema anterior laterally versus habitus. IMPRESSION: 1. No acute fracture or dislocation. 2. Tricompartmental osteoarthritis, with mild progression from 2013. Electronically Signed   By: Jeb Levering M.D.   On: 11/04/2017 22:48    Procedures Procedures (including critical care time)  Medications Ordered in ED Medications - No data to display   Initial Impression / Assessment and Plan / ED Course  I have reviewed the triage vital signs and the nursing notes.  Pertinent labs & imaging results that were available during my care of the patient were reviewed by me and considered in my medical decision making (see chart  for details).     Patient with mechanical fall and bilateral knees.  There is some evidence of a possible tibial plateau fracture on her left knee.  Will check CT for further characterization.  CT is reassuring.  We will discharged home with Ace wraps.  Patient encouraged ice and elevation.  She understands and agrees with the plan.  She is stable and ready for discharge.  Final Clinical Impressions(s) / ED Diagnoses   Final diagnoses:  Fall, initial encounter  Acute pain of both knees    ED Discharge Orders    None       Montine Circle, PA-C 11/05/17 0159    Orpah Greek, MD 11/05/17 [phone]

## 2017-12-02 ENCOUNTER — Ambulatory Visit (HOSPITAL_COMMUNITY)
Admission: RE | Admit: 2017-12-02 | Discharge: 2017-12-02 | Disposition: A | Discharge status: Home / Self Care | Payer: Medicaid Other | Source: Ambulatory Visit | Attending: Nurse Practitioner | Admitting: Nurse Practitioner

## 2017-12-02 ENCOUNTER — Other Ambulatory Visit (HOSPITAL_COMMUNITY): Payer: Self-pay | Admitting: Family Medicine

## 2017-12-02 ENCOUNTER — Other Ambulatory Visit (HOSPITAL_COMMUNITY): Payer: Self-pay

## 2017-12-02 DIAGNOSIS — M7989 Other specified soft tissue disorders: Principal | ICD-10-CM

## 2017-12-02 DIAGNOSIS — M79605 Pain in left leg: Secondary | ICD-10-CM

## 2017-12-02 NOTE — Progress Notes (Signed)
Preliminary notes--Left lower extremity venous duplex exam completed. Negative for DVT. An approximal 4.83x2.35x4.47cm complex fluid collection seen at the prox calf anteriorly.  Result attempted called ordering physician office, no answers. Patient could not wait any longer for listening to the instruction and left.  Will e-fax to [place]. Hongying Leslieann Whisman (RDMS RVT) 12/02/17 2:19 PM

## 2018-02-07 DIAGNOSIS — R61 Generalized hyperhidrosis: Secondary | ICD-10-CM | POA: Diagnosis not present

## 2018-02-07 DIAGNOSIS — B354 Tinea corporis: Secondary | ICD-10-CM

## 2018-02-22 ENCOUNTER — Other Ambulatory Visit: Payer: Self-pay | Admitting: Nurse Practitioner

## 2018-02-23 ENCOUNTER — Other Ambulatory Visit: Payer: Self-pay | Admitting: Nurse Practitioner

## 2018-02-27 ENCOUNTER — Telehealth: Payer: Self-pay

## 2018-02-27 ENCOUNTER — Other Ambulatory Visit: Payer: Self-pay

## 2018-02-27 MED ORDER — SUVOREXANT 15 MG PO TABS: 1.0000 | ORAL_TABLET | Freq: Every evening | ORAL | 0 refills | Status: DC | PRN

## 2018-02-27 NOTE — Telephone Encounter (Signed)
Patient notified that samples of Belsomra is available for pickup.

## 2018-03-08 ENCOUNTER — Other Ambulatory Visit: Payer: Self-pay | Admitting: Nurse Practitioner

## 2018-03-09 ENCOUNTER — Other Ambulatory Visit: Payer: Self-pay | Admitting: Nurse Practitioner

## 2018-03-13 ENCOUNTER — Telehealth: Payer: Self-pay | Admitting: Nurse Practitioner

## 2018-03-13 NOTE — Telephone Encounter (Signed)
Completed [place] PA form for Belsomra and faxed to [phone]

## 2018-03-14 ENCOUNTER — Telehealth: Payer: Self-pay

## 2018-03-14 NOTE — Telephone Encounter (Signed)
Pt called about PA needing to be renewed for her Belsomra she wanted to know why it has not been done already. [place] pt call and left v/m advising her the PA has already been submitted and if she needs samples we have some here for her to pick up. YRL,RMA

## 2018-03-22 ENCOUNTER — Encounter: Payer: Self-pay | Admitting: Nurse Practitioner

## 2018-03-22 DIAGNOSIS — R61 Generalized hyperhidrosis: Secondary | ICD-10-CM | POA: Insufficient documentation

## 2018-03-22 DIAGNOSIS — B354 Tinea corporis: Secondary | ICD-10-CM

## 2018-03-23 ENCOUNTER — Encounter: Payer: Self-pay | Admitting: Nurse Practitioner

## 2018-03-23 ENCOUNTER — Ambulatory Visit: Payer: Medicaid Other | Admitting: Nurse Practitioner

## 2018-03-23 VITALS — BP 110/62 | HR 84 | Temp 98.1°F | Ht 65.0 in | Wt 284.2 lb

## 2018-03-23 DIAGNOSIS — Z720 Tobacco use: Secondary | ICD-10-CM

## 2018-03-23 DIAGNOSIS — Z23 Encounter for immunization: Secondary | ICD-10-CM

## 2018-03-23 DIAGNOSIS — Z1231 Encounter for screening mammogram for malignant neoplasm of breast: Secondary | ICD-10-CM | POA: Diagnosis not present

## 2018-03-23 DIAGNOSIS — I10 Essential (primary) hypertension: Secondary | ICD-10-CM

## 2018-03-23 DIAGNOSIS — Z7982 Long term (current) use of aspirin: Secondary | ICD-10-CM

## 2018-03-23 DIAGNOSIS — Z0000 Encounter for general adult medical examination without abnormal findings: Secondary | ICD-10-CM | POA: Diagnosis not present

## 2018-03-23 DIAGNOSIS — L0292 Furuncle, unspecified: Secondary | ICD-10-CM

## 2018-03-23 DIAGNOSIS — Z1211 Encounter for screening for malignant neoplasm of colon: Secondary | ICD-10-CM | POA: Diagnosis not present

## 2018-03-23 LAB — POCT URINALYSIS DIPSTICK
BILIRUBIN UA: NEGATIVE
Blood, UA: NEGATIVE
GLUCOSE UA: NEGATIVE
KETONES UA: NEGATIVE
NITRITE UA: NEGATIVE
PROTEIN UA: NEGATIVE
Spec Grav, UA: 1.015 (ref 1.010–1.025)
Urobilinogen, UA: 0.2 E.U./dL
pH, UA: 7 (ref 5.0–8.0)

## 2018-03-23 LAB — POCT UA - MICROALBUMIN
ALBUMIN/CREATININE RATIO, URINE, POC: 30
CREATININE, POC: 50 mg/dL
Microalbumin Ur, POC: 10 mg/L

## 2018-03-23 NOTE — Patient Instructions (Signed)

## 2018-03-23 NOTE — Progress Notes (Addendum)
Subjective:     Patient ID: CHELCY BOLDA , female    DOB: January 04, 1956 , 62 y.o.   MRN: [id number]   Chief Complaint  Patient presents with  . Annual Exam    HPI The patient states she uses post menopausal status for birth control. Last LMP was Patient's last menstrual period was 08/13/2011.. Negative for Dysmenorrhea and Negative for Menorrhagia Mammogram last done 2018 Negative for: breast discharge, breast lump(s), breast pain and breast self exam.  Pertinent negatives include abnormal bleeding (hematology), anxiety, decreased libido, depression, difficulty falling sleep, dyspareunia, history of infertility, nocturia, sexual dysfunction, sleep disturbances, urinary incontinence, urinary urgency, vaginal discharge and vaginal itching. Diet regular.The patient states her exercise level is  minimal  . The patient's tobacco use is:  Social History   Tobacco Use  Smoking Status Current Every Day Smoker  . Packs/day: 0.50  . Years: 40.00  . Pack years: 20.00  . Types: Cigarettes  Smokeless Tobacco Never Used  . She has been exposed to passive smoke. The patient's alcohol use is:  Social History   Substance and Sexual Activity  Alcohol Use No  . Alcohol/week: 0.0 standard drinks  . Additional information: Last pap 02/2017, next one scheduled for 2021  HPI   Past Medical History:  Diagnosis Date  . Angina   . Anxiety   . Bipolar 1 disorder ([place])   . Cervicalgia   . Chronic pain syndrome   . Diabetes mellitus without complication ([place])   . Disturbance of skin sensation   . Dizziness and giddiness   . Enthesopathy of hip region   . Headache   . Hypertension   . Lumbago   . Obesity   . Pain in joint, hand      Family History  Problem Relation Age of Onset  . Alcohol abuse Mother   . Alcohol abuse Father   . Alcohol abuse Sister   . Alcohol abuse Brother      Current Outpatient Medications:  .  alendronate (FOSAMAX) 70 MG tablet, TAKE 1 TABLET EVERY WEEK IN THE  MORNING AT LEAST 30 MINS BEFORE FIRST FOOD/BEVERAGE/OR MEDS OF DAY, Disp: 4 tablet, Rfl: 1 .  aspirin EC 81 MG tablet, Take 81 mg by mouth daily., Disp: , Rfl:  .  buPROPion (WELLBUTRIN XL) 150 MG 24 hr tablet, TAKE 1 TABLET EVERY DAY, Disp: 30 tablet, Rfl: 5 .  cyclobenzaprine (FLEXERIL) 10 MG tablet, Take 10 mg by mouth 3 (three) times daily as needed for muscle spasms., Disp: , Rfl:  .  furosemide (LASIX) 40 MG tablet, TAKE 1 TABLET EVERY DAY, Disp: 90 tablet, Rfl: 0 .  gabapentin (NEURONTIN) 300 MG capsule, Take 300 mg by mouth 3 (three) times daily., Disp: , Rfl:  .  ibuprofen (ADVIL,MOTRIN) 800 MG tablet, Take 800 mg by mouth 3 (three) times daily., Disp: , Rfl:  .  ketoconazole (NIZORAL) 2 % shampoo, APPLY DAILY, LATHER, LEAVE IN PLACE FOR 5 MINUTES THEN RINSE OFF WITH WATER, Disp: , Rfl: 1 .  metFORMIN (GLUCOPHAGE) 500 MG tablet, Take 500 mg by mouth 2 (two) times daily with a meal. , Disp: , Rfl:  .  mometasone (ELOCON) 0.1 % cream, APPLY TO AFFECTED AREA EVERY DAY A THIN LAYER, Disp: , Rfl: 2 .  Multiple Vitamins-Minerals (MULTIVITAMIN WITH MINERALS) tablet, Take 1 tablet by mouth daily., Disp: , Rfl:  .  oxyCODONE HCl 15 MG TABA, Take 10 mg by mouth 4 (four) times daily. , Disp: , Rfl:  .  PARoxetine (PAXIL) 10 MG tablet, Take 10 mg by mouth daily., Disp: , Rfl: 2 .  Vitamin D, Ergocalciferol, (DRISDOL) 50000 units CAPS capsule, Take 50,000 Units by mouth every 7 (seven) days., Disp: , Rfl:  .  BELSOMRA 15 MG TABS, , Disp: , Rfl: 5 .  hydrochlorothiazide (HYDRODIURIL) 12.5 MG tablet, Take 1 tablet (12.5 mg total) by mouth daily., Disp: 30 tablet, Rfl: 0 .  potassium chloride SA (K-DUR,KLOR-CON) 20 MEQ tablet, Take 1 tablet (20 mEq total) by mouth daily., Disp: 30 tablet, Rfl: 0   No Known Allergies   Review of Systems  Constitutional: Negative.   HENT: Negative.   Eyes: Negative.   Respiratory: Negative.   Cardiovascular: Negative.   Gastrointestinal: Negative.   Endocrine:  Negative.   Genitourinary: Negative.   Musculoskeletal: Negative.   Skin: Negative.   Allergic/Immunologic: Negative.   Neurological: Negative.   Hematological: Negative.   Psychiatric/Behavioral: Negative.      Today's Vitals   03/23/18 0908  BP: 110/62  Pulse: 84  Temp: 98.1 F (36.7 C)  TempSrc: Oral  SpO2: 92%  Weight: 284 lb 3.2 oz (128.9 kg)  Height: 5\' 5"  (1.651 m)  PainSc: 8    Body mass index is 47.29 kg/m.   Objective:  Physical Exam  Constitutional: She is oriented to person, place, and time. She appears well-developed and well-nourished.  HENT:  Head: Normocephalic and atraumatic.  Right Ear: External ear normal.  Left Ear: External ear normal.  Nose: Nose normal.  Mouth/Throat: Oropharynx is clear and moist.  Eyes: Pupils are equal, round, and reactive to light. Conjunctivae and EOM are normal.  Neck: Normal range of motion. Neck supple.  Cardiovascular: Normal rate, regular rhythm, normal heart sounds and intact distal pulses.  Pulmonary/Chest: Effort normal and breath sounds normal.  Abdominal: Soft. Bowel sounds are normal.  Musculoskeletal: Normal range of motion.  Neurological: She is alert and oriented to person, place, and time.  Skin: Skin is dry. There is erythema.        Assessment And Plan:     1. Health maintenance examination  Behavior modifications discussed and diet history reviewed.   Pt will continue to exercise regularly and modify diet with low GI, plant based foods and decrease intake of processed foods.   Recommend intake of daily multivitamin, Vitamin D, and calcium. Recommend mammogram and colonoscopy for preventive screenings, as well as recommend immunizations that include influenza, TDAP, and Shingles - CMP14 + Anion Gap - Hemoglobin A1c - CBC no Diff - Lipid Profile - HIV antibody (with reflex)   2. Encounter for screening mammogram for breast cancer - MM Digital Screening; Future  3. Encounter for screening for  malignant neoplasm of colon  Referral to be made to GI  4. Need for influenza vaccination  Influenza vaccine administered  Encouraged to take Tylenol as needed for fever or muscle aches.  - Flu Vaccine QUAD 6+ mos PF IM (Fluarix Quad PF)  5. Essential hypertension  Chronic, controlled  Continue with current medications  EKG done with NSR  - EKG 12-Lead - POCT UA - Microalbumin - POCT Urinalysis Dipstick (81002)  6. Boil  Firm slightly reddened boil to right axilla  Encouraged to use warm compresses - cephALEXin (KEFLEX) 500 MG capsule; Take 1 capsule (500 mg total) by mouth 4 (four) times daily for 10 days.  Dispense: 40 capsule; Refill: 0  [name], FNP

## 2018-03-24 ENCOUNTER — Other Ambulatory Visit: Payer: Self-pay

## 2018-03-24 ENCOUNTER — Encounter: Payer: Self-pay | Admitting: Nurse Practitioner

## 2018-03-24 LAB — CBC
Hematocrit: 41.5 % (ref 34.0–46.6)
Hemoglobin: 14.3 g/dL (ref 11.1–15.9)
MCH: 30.6 pg (ref 26.6–33.0)
MCHC: 34.5 g/dL (ref 31.5–35.7)
MCV: 89 fL (ref 79–97)
PLATELETS: 230 10*3/uL (ref 150–450)
RBC: 4.67 x10E6/uL (ref 3.77–5.28)
RDW: 13.1 % (ref 12.3–15.4)
WBC: 7.6 10*3/uL (ref 3.4–10.8)

## 2018-03-24 LAB — CMP14 + ANION GAP
A/G RATIO: 1.7 (ref 1.2–2.2)
ALK PHOS: 92 IU/L (ref 39–117)
ALT: 27 IU/L (ref 0–32)
AST: 29 IU/L (ref 0–40)
Albumin: 4.2 g/dL (ref 3.6–4.8)
Anion Gap: 18 mmol/L (ref 10.0–18.0)
BILIRUBIN TOTAL: 0.3 mg/dL (ref 0.0–1.2)
BUN / CREAT RATIO: 14 (ref 12–28)
BUN: 10 mg/dL (ref 8–27)
CHLORIDE: 98 mmol/L (ref 96–106)
CO2: 24 mmol/L (ref 20–29)
Calcium: 9.5 mg/dL (ref 8.7–10.3)
Creatinine, Ser: 0.73 mg/dL (ref 0.57–1.00)
GFR calc Af Amer: 102 mL/min/{1.73_m2} (ref >59)
GFR calc non Af Amer: 89 mL/min/{1.73_m2} (ref >59)
GLUCOSE: 111 mg/dL — AB (ref 65–99)
Globulin, Total: 2.5 g/dL (ref 1.5–4.5)
POTASSIUM: 3.7 mmol/L (ref 3.5–5.2)
Sodium: 140 mmol/L (ref 134–144)
TOTAL PROTEIN: 6.7 g/dL (ref 6.0–8.5)

## 2018-03-24 LAB — HIV ANTIBODY (ROUTINE TESTING W REFLEX): HIV SCREEN 4TH GENERATION: NONREACTIVE

## 2018-03-24 LAB — LIPID PANEL
Chol/HDL Ratio: 2.8 ratio (ref 0.0–4.4)
Cholesterol, Total: 155 mg/dL (ref 100–199)
HDL: 55 mg/dL (ref >39)
LDL Calculated: 73 mg/dL (ref 0–99)
Triglycerides: 134 mg/dL (ref 0–149)
VLDL Cholesterol Cal: 27 mg/dL (ref 5–40)

## 2018-03-24 LAB — HEMOGLOBIN A1C
Est. average glucose Bld gHb Est-mCnc: 123 mg/dL
Hgb A1c MFr Bld: 5.9 % — ABNORMAL HIGH (ref 4.8–5.6)

## 2018-03-24 MED ORDER — CEPHALEXIN 500 MG PO CAPS: 500.0000 mg | ORAL_CAPSULE | Freq: Four times a day (QID) | ORAL | 0 refills | Status: AC

## 2018-04-04 LAB — HM MAMMOGRAPHY: HM Mammogram: NORMAL (ref 0–4)

## 2018-04-05 ENCOUNTER — Encounter: Payer: Self-pay | Admitting: Nurse Practitioner

## 2018-04-05 ENCOUNTER — Other Ambulatory Visit: Payer: Self-pay | Admitting: Nurse Practitioner

## 2018-04-06 ENCOUNTER — Other Ambulatory Visit: Payer: Self-pay | Admitting: Nurse Practitioner

## 2018-04-06 ENCOUNTER — Encounter: Payer: Self-pay | Admitting: Nurse Practitioner

## 2018-04-11 ENCOUNTER — Encounter: Payer: Self-pay | Admitting: Nurse Practitioner

## 2018-04-14 ENCOUNTER — Encounter: Payer: Self-pay | Admitting: Nurse Practitioner

## 2018-04-25 LAB — COLOGUARD: Cologuard: POSITIVE

## 2018-04-26 ENCOUNTER — Other Ambulatory Visit: Payer: Self-pay | Admitting: Nurse Practitioner

## 2018-04-28 ENCOUNTER — Encounter: Payer: Self-pay | Admitting: Nurse Practitioner

## 2018-05-03 ENCOUNTER — Encounter: Payer: Self-pay | Admitting: Nurse Practitioner

## 2018-05-10 ENCOUNTER — Telehealth: Payer: Self-pay

## 2018-05-10 ENCOUNTER — Other Ambulatory Visit: Payer: Self-pay | Admitting: Nurse Practitioner

## 2018-05-10 NOTE — Telephone Encounter (Signed)
I called to advise pt that we referred her to GI specialist due to her having a positive cologuard and patient stated she is unable to see Dr.Mann because she owes them $300 she wanted to know what does her having a positive mean exactly? And she had stated that her pap smears come back positive all the time and it ends up being nothing so she is not concerned.  I returned pt call this morning to advise her that the positive means she has some type of abnormal growth and it needs to be evaluated which is why we are sending her to the GI specialist so they can do a colonscopy. Patient understood and agreed as long as it is not at Dr.Mann's office. YRL,RMA

## 2018-05-11 ENCOUNTER — Other Ambulatory Visit: Payer: Self-pay | Admitting: Nurse Practitioner

## 2018-05-11 MED ORDER — FUROSEMIDE 40 MG PO TABS: 40.0000 mg | ORAL_TABLET | Freq: Every day | ORAL | 0 refills | Status: DC

## 2018-06-06 ENCOUNTER — Other Ambulatory Visit: Payer: Self-pay | Admitting: Internal Medicine

## 2018-06-20 ENCOUNTER — Other Ambulatory Visit: Payer: Self-pay | Admitting: Nurse Practitioner

## 2018-07-02 ENCOUNTER — Other Ambulatory Visit: Payer: Self-pay | Admitting: Nurse Practitioner

## 2018-07-03 ENCOUNTER — Other Ambulatory Visit: Payer: Self-pay | Admitting: Nurse Practitioner

## 2018-07-18 ENCOUNTER — Other Ambulatory Visit: Payer: Self-pay | Admitting: Nurse Practitioner

## 2018-07-20 ENCOUNTER — Other Ambulatory Visit: Payer: Self-pay | Admitting: Nurse Practitioner

## 2018-07-21 ENCOUNTER — Other Ambulatory Visit: Payer: Self-pay | Admitting: Nurse Practitioner

## 2018-07-21 ENCOUNTER — Encounter: Payer: Self-pay | Admitting: Nurse Practitioner

## 2018-07-21 MED ORDER — SUVOREXANT 15 MG PO TABS: 15.0000 mg | ORAL_TABLET | Freq: Every evening | ORAL | 3 refills | Status: DC | PRN

## 2018-07-21 MED ORDER — FUROSEMIDE 40 MG PO TABS: 40.0000 mg | ORAL_TABLET | Freq: Every day | ORAL | 0 refills | Status: DC

## 2018-07-26 ENCOUNTER — Other Ambulatory Visit: Payer: Self-pay | Admitting: Nurse Practitioner

## 2018-07-26 DIAGNOSIS — G4700 Insomnia, unspecified: Secondary | ICD-10-CM

## 2018-07-26 MED ORDER — SUVOREXANT 15 MG PO TABS: 1.0000 | ORAL_TABLET | Freq: Every evening | ORAL | 3 refills | Status: DC | PRN

## 2018-08-03 ENCOUNTER — Encounter: Payer: Self-pay | Admitting: Nurse Practitioner

## 2018-08-04 ENCOUNTER — Other Ambulatory Visit: Payer: Medicaid Other

## 2018-08-04 ENCOUNTER — Other Ambulatory Visit: Payer: Self-pay | Admitting: Nurse Practitioner

## 2018-08-04 ENCOUNTER — Other Ambulatory Visit: Payer: Self-pay

## 2018-08-04 DIAGNOSIS — Z6841 Body Mass Index (BMI) 40.0 and over, adult: Secondary | ICD-10-CM

## 2018-08-04 NOTE — Progress Notes (Signed)
Patient requested to have labs done in office for her pain clinic [facility].

## 2018-08-05 LAB — CBC
Hematocrit: 42.2 % (ref 34.0–46.6)
Hemoglobin: 14.5 g/dL (ref 11.1–15.9)
MCH: 31.3 pg (ref 26.6–33.0)
MCHC: 34.4 g/dL (ref 31.5–35.7)
MCV: 91 fL (ref 79–97)
Platelets: 220 10*3/uL (ref 150–450)
RBC: 4.63 x10E6/uL (ref 3.77–5.28)
RDW: 13 % (ref 11.7–15.4)
WBC: 7.1 10*3/uL (ref 3.4–10.8)

## 2018-08-05 LAB — HEPATIC FUNCTION PANEL
ALT: 20 IU/L (ref 0–32)
AST: 17 IU/L (ref 0–40)
Albumin: 4 g/dL (ref 3.8–4.8)
Alkaline Phosphatase: 98 IU/L (ref 39–117)
Bilirubin Total: 0.3 mg/dL (ref 0.0–1.2)
Bilirubin, Direct: 0.14 mg/dL (ref 0.00–0.40)
TOTAL PROTEIN: 6.6 g/dL (ref 6.0–8.5)

## 2018-08-05 LAB — BMP8+EGFR
BUN/Creatinine Ratio: 12 (ref 12–28)
BUN: 9 mg/dL (ref 8–27)
CO2: 26 mmol/L (ref 20–29)
Calcium: 9.7 mg/dL (ref 8.7–10.3)
Chloride: 99 mmol/L (ref 96–106)
Creatinine, Ser: 0.75 mg/dL (ref 0.57–1.00)
GFR calc Af Amer: 99 mL/min/{1.73_m2} (ref >59)
GFR calc non Af Amer: 86 mL/min/{1.73_m2} (ref >59)
GLUCOSE: 112 mg/dL — AB (ref 65–99)
Potassium: 3.7 mmol/L (ref 3.5–5.2)
Sodium: 141 mmol/L (ref 134–144)

## 2018-08-05 LAB — PHOSPHORUS: Phosphorus: 3.7 mg/dL (ref 3.0–4.3)

## 2018-08-05 LAB — MAGNESIUM: Magnesium: 1.7 mg/dL (ref 1.6–2.3)

## 2018-08-31 ENCOUNTER — Encounter: Payer: Self-pay | Admitting: Nurse Practitioner

## 2018-08-31 ENCOUNTER — Other Ambulatory Visit: Payer: Self-pay | Admitting: Nurse Practitioner

## 2018-08-31 MED ORDER — OXYBUTYNIN CHLORIDE 5 MG PO TABS: 5.0000 mg | ORAL_TABLET | Freq: Every day | ORAL | 0 refills | Status: DC

## 2018-09-04 MED ORDER — FUROSEMIDE 40 MG PO TABS: 40.0000 mg | ORAL_TABLET | Freq: Every day | ORAL | 0 refills | Status: DC

## 2018-09-05 ENCOUNTER — Other Ambulatory Visit: Payer: Self-pay | Admitting: Nurse Practitioner

## 2018-09-12 ENCOUNTER — Other Ambulatory Visit: Payer: Self-pay | Admitting: Nurse Practitioner

## 2018-09-20 ENCOUNTER — Telehealth: Payer: Self-pay

## 2018-09-20 NOTE — Telephone Encounter (Signed)
Patient called asking if we could move her appointment down to a later time.   Appointment has been rescheduled to a later time. YRL,RMA

## 2018-09-21 ENCOUNTER — Ambulatory Visit: Payer: Medicaid Other | Admitting: Nurse Practitioner

## 2018-09-21 ENCOUNTER — Other Ambulatory Visit: Payer: Self-pay

## 2018-09-21 ENCOUNTER — Encounter: Payer: Self-pay | Admitting: Nurse Practitioner

## 2018-09-21 VITALS — BP 140/82 | HR 90 | Temp 97.9°F | Ht 64.8 in | Wt 275.0 lb

## 2018-09-21 DIAGNOSIS — I10 Essential (primary) hypertension: Secondary | ICD-10-CM | POA: Diagnosis not present

## 2018-09-21 DIAGNOSIS — L03116 Cellulitis of left lower limb: Secondary | ICD-10-CM

## 2018-09-21 MED ORDER — CEPHALEXIN 500 MG PO CAPS: 500.0000 mg | ORAL_CAPSULE | Freq: Four times a day (QID) | ORAL | 0 refills | Status: AC

## 2018-09-21 NOTE — Progress Notes (Signed)
Subjective:     Patient ID: Madison Wells , female    DOB: 02-Jan-1956 , 63 y.o.   MRN: [id number]   Chief Complaint  Patient presents with  . Hypertension    HPI  Hypertension  This is a chronic problem. The current episode started more than 1 year ago. The problem is controlled. Pertinent negatives include no anxiety, blurred vision, chest pain, headaches, palpitations or peripheral edema. There are no associated agents to hypertension. Risk factors for coronary artery disease include obesity, sedentary lifestyle and smoking/tobacco exposure. The current treatment provides moderate improvement. There are no compliance problems.  There is no history of angina. There is no history of chronic renal disease.     Past Medical History:  Diagnosis Date  . Angina   . Anxiety   . Bipolar 1 disorder ([place])   . Cervicalgia   . Chronic pain syndrome   . Diabetes mellitus without complication ([place])   . Disturbance of skin sensation   . Dizziness and giddiness   . Enthesopathy of hip region   . Headache   . Hypertension   . Lumbago   . Obesity   . Pain in joint, hand      Family History  Problem Relation Age of Onset  . Alcohol abuse Mother   . Alcohol abuse Father   . Alcohol abuse Sister   . Alcohol abuse Brother      Current Outpatient Medications:  .  alendronate (FOSAMAX) 70 MG tablet, TAKE 1 TABLET EVERY WEEK IN THE MORNING AT LEAST 30 MINS BEFORE FIRST FOOD/BEVERAGE/OR MEDS OF DAY, Disp: 4 tablet, Rfl: 1 .  aspirin EC 81 MG tablet, Take 81 mg by mouth daily., Disp: , Rfl:  .  cyclobenzaprine (FLEXERIL) 10 MG tablet, Take 10 mg by mouth 3 (three) times daily as needed for muscle spasms., Disp: , Rfl:  .  furosemide (LASIX) 40 MG tablet, Take 1 tablet (40 mg total) by mouth daily., Disp: 90 tablet, Rfl: 0 .  gabapentin (NEURONTIN) 300 MG capsule, TAKE 1 CAPSULE BY MOUTH THREE TIMES A DAY, Disp: 90 capsule, Rfl: 1 .  hydrochlorothiazide (HYDRODIURIL) 12.5 MG tablet, TAKE  1 TABLET BY MOUTH EVERY DAY, Disp: 90 tablet, Rfl: 1 .  ibuprofen (ADVIL,MOTRIN) 800 MG tablet, Take 800 mg by mouth 3 (three) times daily., Disp: , Rfl:  .  ketoconazole (NIZORAL) 2 % shampoo, APPLY DAILY, LATHER, LEAVE IN PLACE FOR 5 MINUTES THEN RINSE OFF WITH WATER, Disp: , Rfl: 1 .  KLOR-CON M20 20 MEQ tablet, TAKE 1 TABLET BY MOUTH WITH FOOD, Disp: 30 tablet, Rfl: 5 .  metFORMIN (GLUCOPHAGE) 500 MG tablet, TAKE 1 TABLET BY MOUTH TWICE A DAY WITH FOOD, Disp: 90 tablet, Rfl: 1 .  mometasone (ELOCON) 0.1 % cream, APPLY TO AFFECTED AREA A THIN LAYER DAILY, Disp: 60 g, Rfl: 2 .  Multiple Vitamins-Minerals (MULTIVITAMIN WITH MINERALS) tablet, Take 1 tablet by mouth daily., Disp: , Rfl:  .  oxybutynin (DITROPAN) 5 MG tablet, Take 1 tablet (5 mg total) by mouth daily., Disp: 90 tablet, Rfl: 0 .  oxyCODONE HCl 15 MG TABA, Take 10 mg by mouth 4 (four) times daily. , Disp: , Rfl:  .  PARoxetine (PAXIL) 10 MG tablet, TAKE 1 TABLET EVERY DAY, Disp: 90 tablet, Rfl: 1 .  Suvorexant (BELSOMRA) 15 MG TABS, Take 15 mg by mouth at bedtime as needed., Disp: 30 tablet, Rfl: 3 .  Suvorexant (BELSOMRA) 15 MG TABS, Take 1 tablet by mouth  at bedtime as needed., Disp: 30 tablet, Rfl: 3 .  Vitamin D, Ergocalciferol, (DRISDOL) 50000 units CAPS capsule, Take 50,000 Units by mouth every 7 (seven) days., Disp: , Rfl:    No Known Allergies   Review of Systems  Constitutional: Negative.  Negative for fatigue.  Eyes: Negative for blurred vision.  Respiratory: Negative.   Cardiovascular: Negative.  Negative for chest pain, palpitations and leg swelling.  Neurological: Negative for dizziness and headaches.  Hematological: Negative.      Today's Vitals   09/21/18 0952  BP: 140/82  Pulse: 90  Temp: 97.9 F (36.6 C)  TempSrc: Oral  Weight: 275 lb (124.7 kg)  Height: 5' 4.8" (1.646 m)  PainSc: 0-No pain   Body mass index is 46.05 kg/m.   Objective:  Physical Exam Vitals signs reviewed.  Constitutional:       Appearance: Normal appearance. She is well-developed. She is obese.  Cardiovascular:     Rate and Rhythm: Normal rate and regular rhythm.     Pulses: Normal pulses.     Heart sounds: Normal heart sounds. No murmur.  Pulmonary:     Effort: Pulmonary effort is normal.     Breath sounds: Normal breath sounds.  Abdominal:     General: Bowel sounds are normal.     Palpations: Abdomen is soft.  Musculoskeletal: Normal range of motion.  Skin:    General: Skin is dry.     Capillary Refill: Capillary refill takes less than 2 seconds.     Findings: Erythema present.  Neurological:     General: No focal deficit present.     Mental Status: She is alert and oriented to person, place, and time.  Psychiatric:        Mood and Affect: Mood normal.        Behavior: Behavior normal.        Thought Content: Thought content normal.        Judgment: Judgment normal.         Assessment And Plan:     1. Essential hypertension . B/P is poorly controlled, continue to limit intake of high salt food  . CMP ordered to check renal function.  . The importance of regular exercise and dietary modification was stressed to the patient.  . Stressed importance of losing ten percent of her body weight to help with B/P control.  . The weight loss would help with decreasing cardiac and cancer risk as well.  - BMP8+eGFR; Future  2. Cellulitis of left lower extremity  Erythema present to lateral shin and slightly hot to touch  Will treat with cephalexin  Advised if worsens or fever to contact office - cephALEXin (KEFLEX) 500 MG capsule; Take 1 capsule (500 mg total) by mouth 4 (four) times daily for 10 days.  Dispense: 40 capsule; Refill: 0 - CBC no Diff   [name], FNP    THE PATIENT IS ENCOURAGED TO PRACTICE SOCIAL DISTANCING DUE TO THE COVID-19 PANDEMIC.

## 2018-09-22 LAB — CBC
Hematocrit: 40.4 % (ref 34.0–46.6)
Hemoglobin: 14.4 g/dL (ref 11.1–15.9)
MCH: 32.2 pg (ref 26.6–33.0)
MCHC: 35.6 g/dL (ref 31.5–35.7)
MCV: 90 fL (ref 79–97)
Platelets: 219 10*3/uL (ref 150–450)
RBC: 4.47 x10E6/uL (ref 3.77–5.28)
RDW: 12.7 % (ref 11.7–15.4)
WBC: 8.2 10*3/uL (ref 3.4–10.8)

## 2018-09-26 IMAGING — CR DG CHEST 2V
2 series · 2 of 2 positions shown · non-contrast
Comparison: Chest radiographs 01/17/2015 and earlier.

CLINICAL DATA: 60-year-old female with weakness and hypotension for
1 day.

EXAM:
CHEST  2 VIEW

[chest lat]
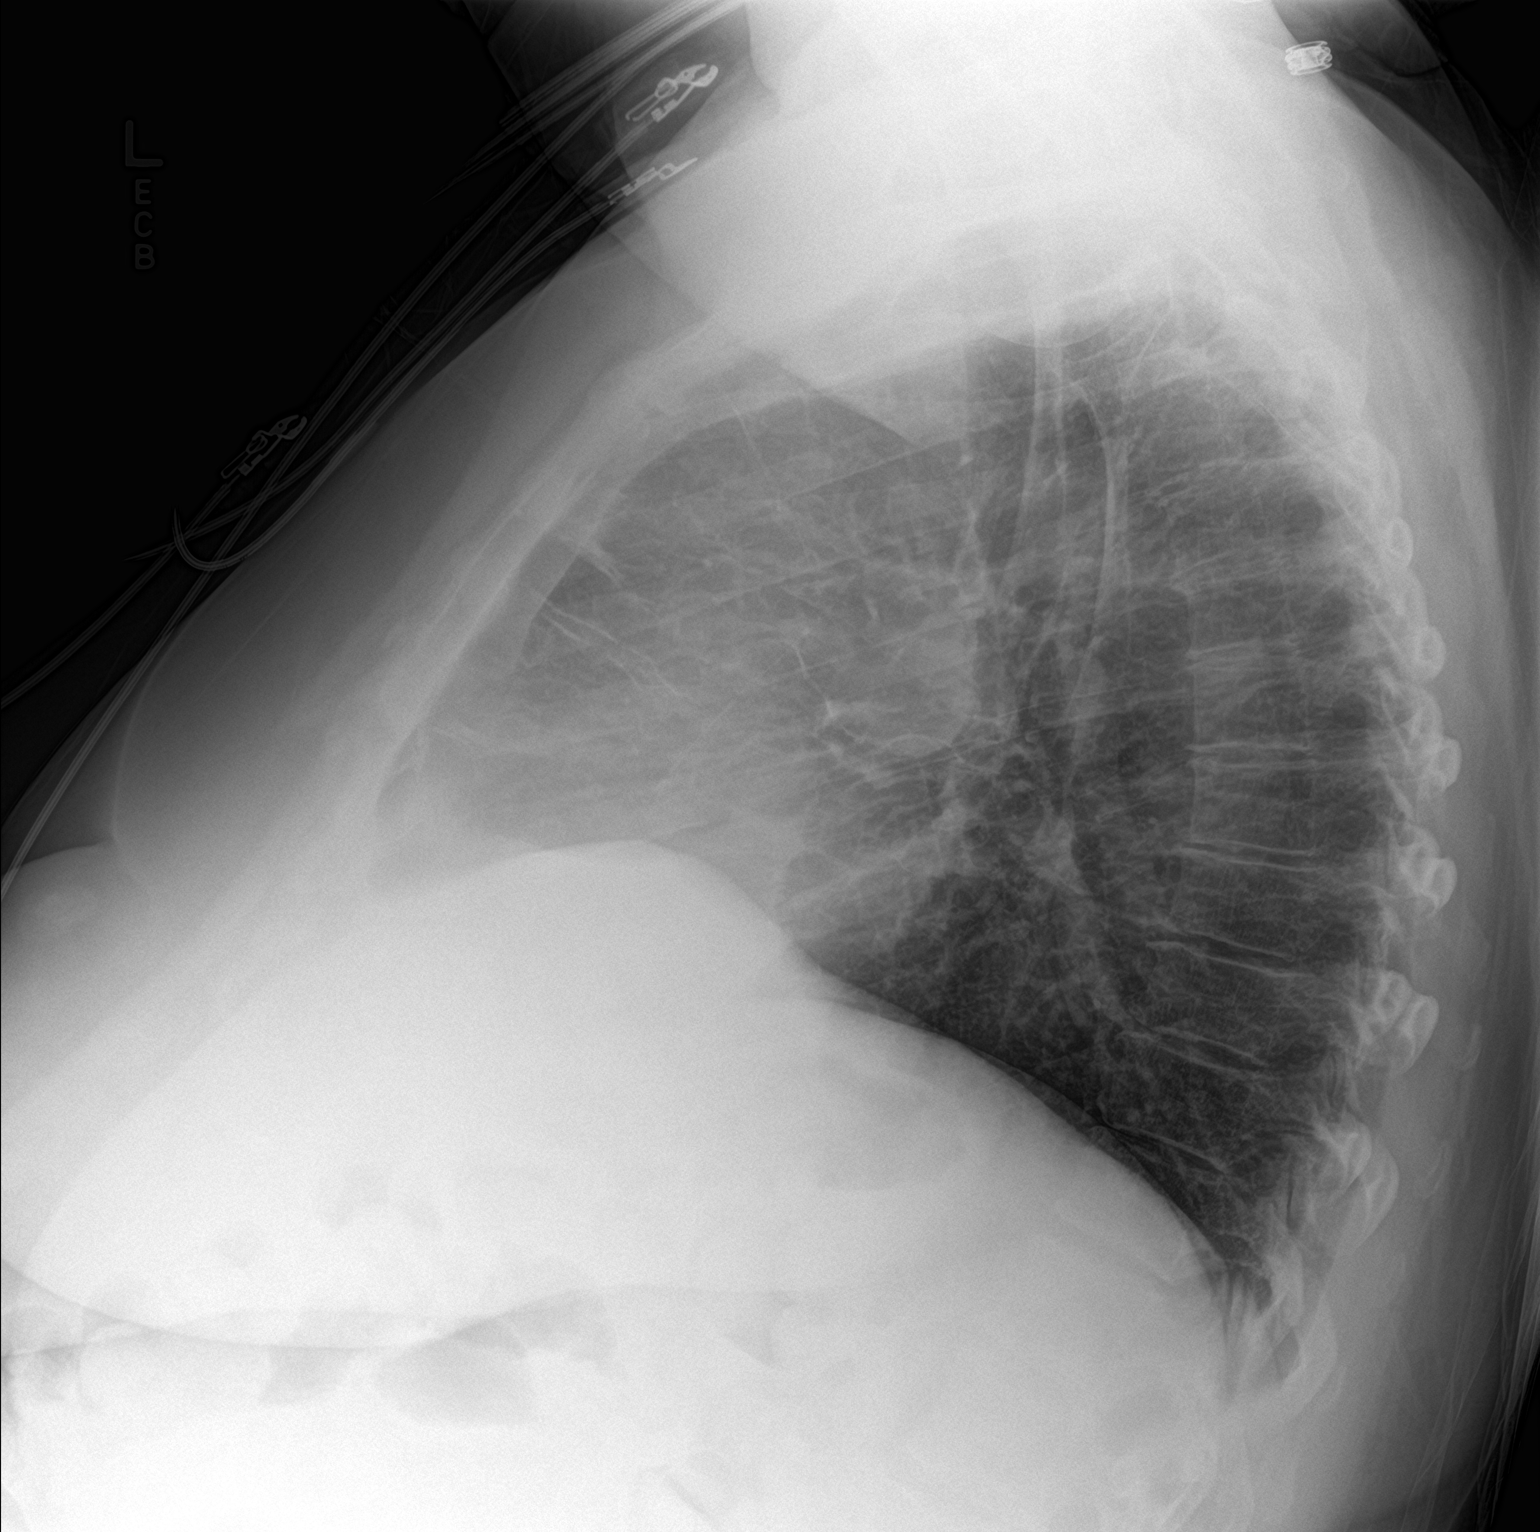

[chest ap]
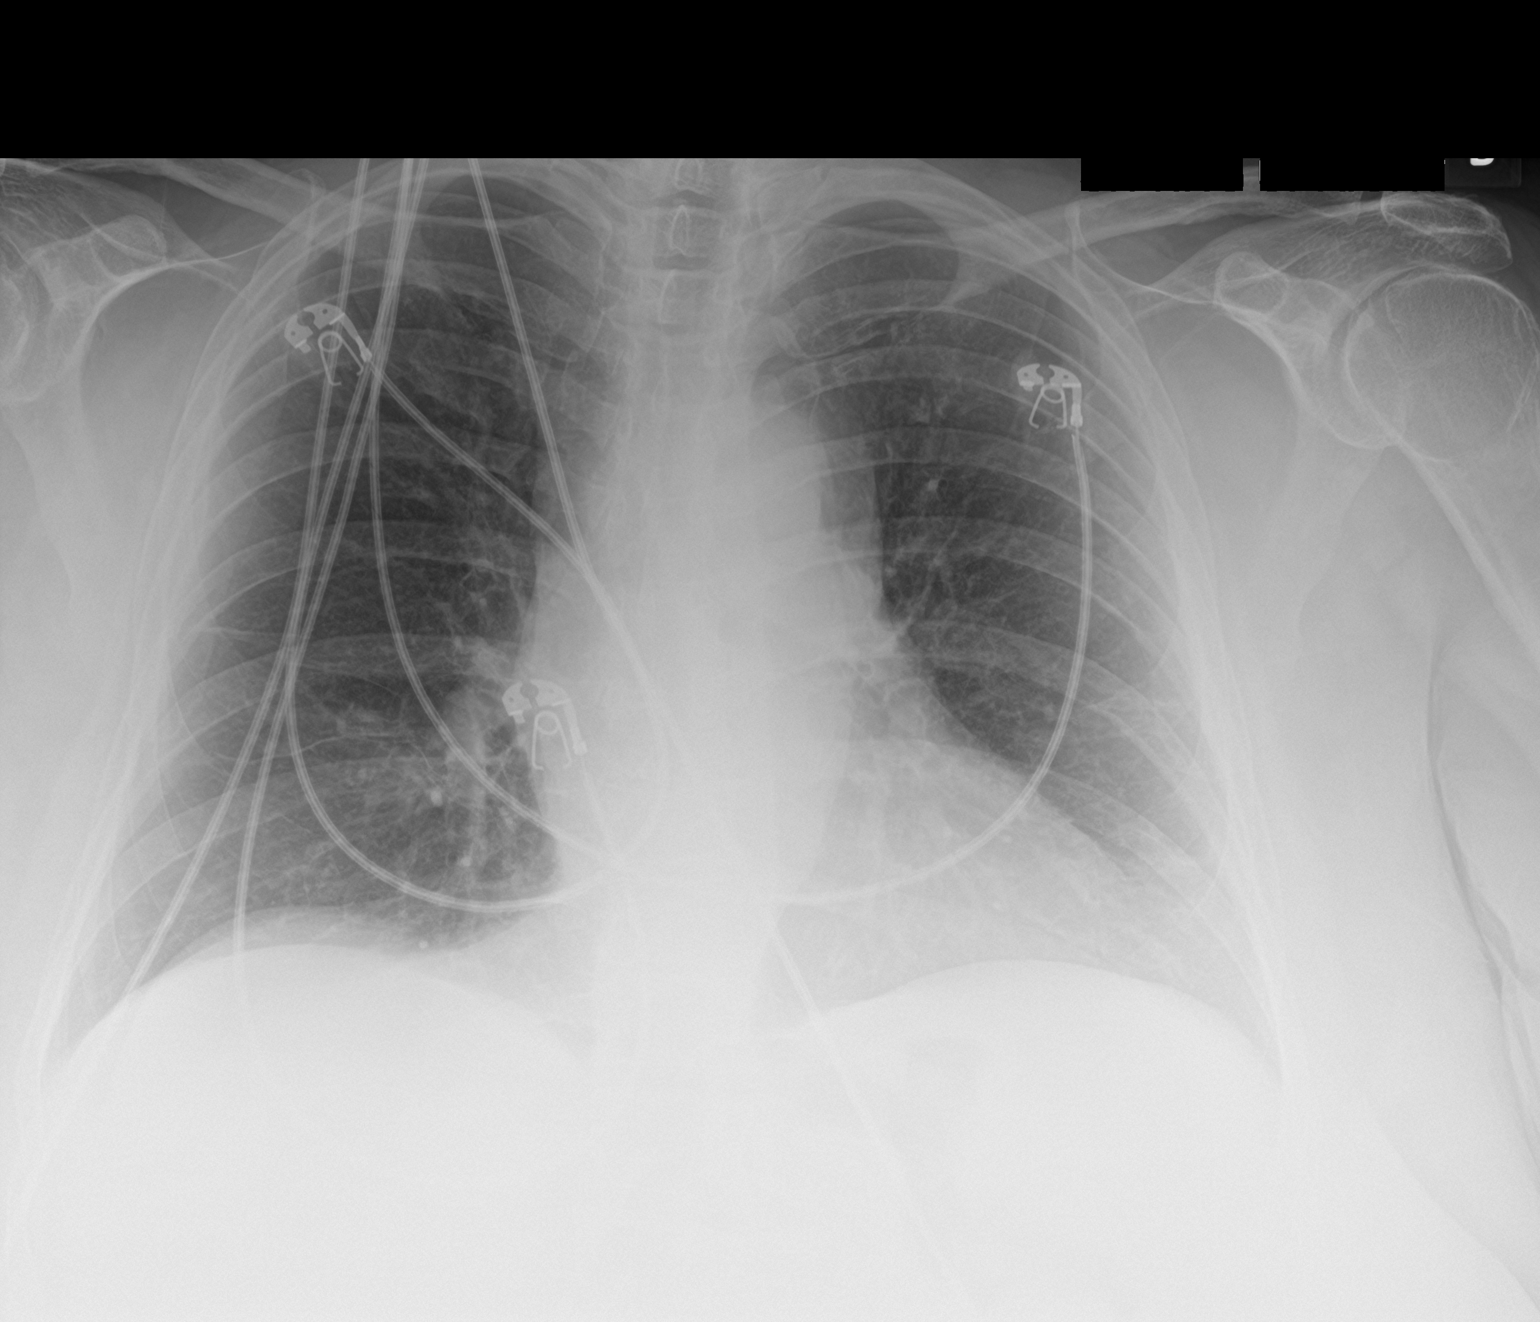

[2 of 2 positions shown; findings below may reference images not displayed]

FINDINGS: Upright AP and lateral views of the chest. Lung volumes are stable
and within normal limits. Mild eventration of the right
hemidiaphragm is unchanged. Mediastinal contours are within normal
limits. Visualized tracheal air column is within normal limits. No
pneumothorax, pulmonary edema, pleural effusion or acute pulmonary
opacity. There is chronic curvilinear anterior lung scarring more
visible on the lateral view. No acute osseous abnormality
identified. Negative visible bowel gas pattern.
IMPRESSION: No acute cardiopulmonary abnormality.

## 2018-09-28 ENCOUNTER — Encounter: Payer: Self-pay | Admitting: Nurse Practitioner

## 2018-10-03 ENCOUNTER — Encounter: Payer: Self-pay | Admitting: Nurse Practitioner

## 2018-10-06 ENCOUNTER — Encounter: Payer: Self-pay | Admitting: Nurse Practitioner

## 2018-10-19 LAB — HM DIABETES EYE EXAM

## 2018-10-19 NOTE — Telephone Encounter (Signed)
Called [facility] and resent a new sedative hypnotic medication form medication request for belsomra

## 2018-10-20 ENCOUNTER — Encounter: Payer: Self-pay | Admitting: Nurse Practitioner

## 2018-10-30 ENCOUNTER — Other Ambulatory Visit: Payer: Self-pay | Admitting: Nurse Practitioner

## 2018-11-29 ENCOUNTER — Other Ambulatory Visit: Payer: Self-pay | Admitting: Internal Medicine

## 2018-11-30 ENCOUNTER — Encounter: Payer: Self-pay | Admitting: Nurse Practitioner

## 2018-11-30 ENCOUNTER — Other Ambulatory Visit: Payer: Self-pay

## 2018-11-30 MED ORDER — POTASSIUM CHLORIDE CRYS ER 20 MEQ PO TBCR: EXTENDED_RELEASE_TABLET | ORAL | 1 refills | Status: DC

## 2018-12-02 ENCOUNTER — Inpatient Hospital Stay
Admission: RE | Admit: 2018-12-02 | Discharge: 2018-12-02 | Disposition: A | Discharge status: Home / Self Care | Payer: Medicaid Other | Source: Ambulatory Visit

## 2018-12-03 ENCOUNTER — Emergency Department (HOSPITAL_COMMUNITY): Payer: Medicaid Other

## 2018-12-03 ENCOUNTER — Encounter (HOSPITAL_COMMUNITY): Payer: Self-pay | Admitting: Emergency Medicine

## 2018-12-03 ENCOUNTER — Other Ambulatory Visit: Payer: Self-pay

## 2018-12-03 ENCOUNTER — Inpatient Hospital Stay (HOSPITAL_COMMUNITY)
Admission: EM | Admit: 2018-12-03 | Discharge: 2018-12-05 | DRG: 603 | Disposition: A | Discharge status: Home / Self Care | Payer: Medicaid Other | Attending: Internal Medicine | Admitting: Internal Medicine

## 2018-12-03 DIAGNOSIS — L03116 Cellulitis of left lower limb: Secondary | ICD-10-CM | POA: Diagnosis not present

## 2018-12-03 DIAGNOSIS — Z716 Tobacco abuse counseling: Secondary | ICD-10-CM

## 2018-12-03 DIAGNOSIS — F319 Bipolar disorder, unspecified: Secondary | ICD-10-CM | POA: Diagnosis present

## 2018-12-03 DIAGNOSIS — L0390 Cellulitis, unspecified: Secondary | ICD-10-CM

## 2018-12-03 DIAGNOSIS — Z79899 Other long term (current) drug therapy: Secondary | ICD-10-CM

## 2018-12-03 DIAGNOSIS — E876 Hypokalemia: Secondary | ICD-10-CM | POA: Diagnosis not present

## 2018-12-03 DIAGNOSIS — F17210 Nicotine dependence, cigarettes, uncomplicated: Secondary | ICD-10-CM | POA: Diagnosis present

## 2018-12-03 DIAGNOSIS — Z7982 Long term (current) use of aspirin: Secondary | ICD-10-CM

## 2018-12-03 DIAGNOSIS — Z6841 Body Mass Index (BMI) 40.0 and over, adult: Secondary | ICD-10-CM

## 2018-12-03 DIAGNOSIS — F419 Anxiety disorder, unspecified: Secondary | ICD-10-CM | POA: Diagnosis not present

## 2018-12-03 DIAGNOSIS — Z811 Family history of alcohol abuse and dependence: Secondary | ICD-10-CM

## 2018-12-03 DIAGNOSIS — Z1159 Encounter for screening for other viral diseases: Secondary | ICD-10-CM

## 2018-12-03 DIAGNOSIS — R7303 Prediabetes: Secondary | ICD-10-CM | POA: Diagnosis present

## 2018-12-03 DIAGNOSIS — M545 Low back pain, unspecified: Secondary | ICD-10-CM | POA: Diagnosis present

## 2018-12-03 DIAGNOSIS — I10 Essential (primary) hypertension: Secondary | ICD-10-CM | POA: Diagnosis present

## 2018-12-03 DIAGNOSIS — G894 Chronic pain syndrome: Secondary | ICD-10-CM | POA: Diagnosis present

## 2018-12-03 DIAGNOSIS — Z7984 Long term (current) use of oral hypoglycemic drugs: Secondary | ICD-10-CM

## 2018-12-03 DIAGNOSIS — Z79891 Long term (current) use of opiate analgesic: Secondary | ICD-10-CM

## 2018-12-03 LAB — URINALYSIS, ROUTINE W REFLEX MICROSCOPIC
Bilirubin Urine: NEGATIVE
Glucose, UA: NEGATIVE mg/dL
Hgb urine dipstick: NEGATIVE
Ketones, ur: NEGATIVE mg/dL
Nitrite: NEGATIVE
Protein, ur: NEGATIVE mg/dL
Specific Gravity, Urine: 1.005 (ref 1.005–1.030)
pH: 8 (ref 5.0–8.0)

## 2018-12-03 LAB — COMPREHENSIVE METABOLIC PANEL
ALT: 17 U/L (ref 0–44)
AST: 20 U/L (ref 15–41)
Albumin: 4 g/dL (ref 3.5–5.0)
Alkaline Phosphatase: 92 U/L (ref 38–126)
Anion gap: 14 (ref 5–15)
BUN: 16 mg/dL (ref 8–23)
CO2: 31 mmol/L (ref 22–32)
Calcium: 11.1 mg/dL — ABNORMAL HIGH (ref 8.9–10.3)
Chloride: 92 mmol/L — ABNORMAL LOW (ref 98–111)
Creatinine, Ser: 0.86 mg/dL (ref 0.44–1.00)
GFR calc Af Amer: >60 mL/min (ref >60)
GFR calc non Af Amer: >60 mL/min (ref >60)
Glucose, Bld: 119 mg/dL — ABNORMAL HIGH (ref 70–99)
Potassium: 2.3 mmol/L — CL (ref 3.5–5.1)
Sodium: 137 mmol/L (ref 135–145)
Total Bilirubin: 0.6 mg/dL (ref 0.3–1.2)
Total Protein: 7.7 g/dL (ref 6.5–8.1)

## 2018-12-03 LAB — CBC WITH DIFFERENTIAL/PLATELET
Abs Immature Granulocytes: 0.04 10*3/uL (ref 0.00–0.07)
Basophils Absolute: 0 10*3/uL (ref 0.0–0.1)
Basophils Relative: 0 %
Eosinophils Absolute: 0.1 10*3/uL (ref 0.0–0.5)
Eosinophils Relative: 1 %
HCT: 41.3 % (ref 36.0–46.0)
Hemoglobin: 13.6 g/dL (ref 12.0–15.0)
Immature Granulocytes: 1 %
Lymphocytes Relative: 21 %
Lymphs Abs: 1.8 10*3/uL (ref 0.7–4.0)
MCH: 30.6 pg (ref 26.0–34.0)
MCHC: 32.9 g/dL (ref 30.0–36.0)
MCV: 92.8 fL (ref 80.0–100.0)
Monocytes Absolute: 0.6 10*3/uL (ref 0.1–1.0)
Monocytes Relative: 7 %
Neutro Abs: 5.8 10*3/uL (ref 1.7–7.7)
Neutrophils Relative %: 70 %
Platelets: 212 10*3/uL (ref 150–400)
RBC: 4.45 MIL/uL (ref 3.87–5.11)
RDW: 13.2 % (ref 11.5–15.5)
WBC: 8.3 10*3/uL (ref 4.0–10.5)
nRBC: 0 % (ref 0.0–0.2)

## 2018-12-03 LAB — LACTIC ACID, PLASMA: Lactic Acid, Venous: 1.1 mmol/L (ref 0.5–1.9)

## 2018-12-03 LAB — SARS CORONAVIRUS 2 BY RT PCR (HOSPITAL ORDER, PERFORMED IN ~~LOC~~ HOSPITAL LAB): SARS Coronavirus 2: NEGATIVE

## 2018-12-03 LAB — MAGNESIUM: Magnesium: 2.1 mg/dL (ref 1.7–2.4)

## 2018-12-03 LAB — GLUCOSE, CAPILLARY: Glucose-Capillary: 131 mg/dL — ABNORMAL HIGH (ref 70–99)

## 2018-12-03 MED ORDER — SODIUM CHLORIDE 0.9 % IV SOLN: 1.0000 g | Freq: Once | INTRAVENOUS | Status: DC

## 2018-12-03 MED ORDER — HYDROCHLOROTHIAZIDE 25 MG PO TABS: 12.5000 mg | ORAL_TABLET | Freq: Every day | ORAL | Status: DC

## 2018-12-03 MED ORDER — SODIUM CHLORIDE 0.9% FLUSH: 3.0000 mL | Freq: Once | INTRAVENOUS | Status: DC

## 2018-12-03 MED ORDER — NICOTINE 14 MG/24HR TD PT24
14.0000 mg | MEDICATED_PATCH | Freq: Every day | TRANSDERMAL | Status: DC
  Administered 2018-12-03 – 2018-12-04 (×2): 14 mg via TRANSDERMAL
  Filled 2018-12-03 (×3): qty 1

## 2018-12-03 MED ORDER — INSULIN ASPART 100 UNIT/ML ~~LOC~~ SOLN
0.0000 [IU] | Freq: Three times a day (TID) | SUBCUTANEOUS | Status: DC
  Administered 2018-12-04: 18:00:00 1 [IU] via SUBCUTANEOUS
  Administered 2018-12-04: 0 [IU] via SUBCUTANEOUS
  Administered 2018-12-04: 13:00:00 1 [IU] via SUBCUTANEOUS
  Filled 2018-12-03: qty 0.09

## 2018-12-03 MED ORDER — VANCOMYCIN HCL IN DEXTROSE 1-5 GM/200ML-% IV SOLN
1000.0000 mg | Freq: Once | INTRAVENOUS | Status: AC
  Administered 2018-12-03: 15:00:00 1000 mg via INTRAVENOUS
  Filled 2018-12-03: qty 200

## 2018-12-03 MED ORDER — ACETAMINOPHEN 650 MG RE SUPP: 650.0000 mg | Freq: Four times a day (QID) | RECTAL | Status: DC | PRN

## 2018-12-03 MED ORDER — ONDANSETRON HCL 4 MG/2ML IJ SOLN: 4.0000 mg | Freq: Four times a day (QID) | INTRAMUSCULAR | Status: DC | PRN

## 2018-12-03 MED ORDER — POTASSIUM CHLORIDE 10 MEQ/100ML IV SOLN
10.0000 meq | Freq: Once | INTRAVENOUS | Status: AC
  Administered 2018-12-03: 10 meq via INTRAVENOUS
  Filled 2018-12-03: qty 100

## 2018-12-03 MED ORDER — FUROSEMIDE 40 MG PO TABS: 40.0000 mg | ORAL_TABLET | Freq: Every day | ORAL | Status: DC

## 2018-12-03 MED ORDER — GABAPENTIN 300 MG PO CAPS
300.0000 mg | ORAL_CAPSULE | Freq: Three times a day (TID) | ORAL | Status: DC
  Administered 2018-12-03 – 2018-12-05 (×5): 300 mg via ORAL
  Filled 2018-12-03 (×5): qty 1

## 2018-12-03 MED ORDER — SODIUM CHLORIDE 0.9 % IV SOLN
2.0000 g | INTRAVENOUS | Status: DC
  Administered 2018-12-04: 21:00:00 2 g via INTRAVENOUS
  Filled 2018-12-03: qty 2

## 2018-12-03 MED ORDER — SUVOREXANT 15 MG PO TABS: 1.0000 | ORAL_TABLET | Freq: Every evening | ORAL | Status: DC | PRN

## 2018-12-03 MED ORDER — VANCOMYCIN HCL 10 G IV SOLR
1500.0000 mg | INTRAVENOUS | Status: DC
  Administered 2018-12-04 – 2018-12-05 (×2): 1500 mg via INTRAVENOUS
  Filled 2018-12-03 (×3): qty 1500

## 2018-12-03 MED ORDER — BUPROPION HCL ER (XL) 150 MG PO TB24
150.0000 mg | ORAL_TABLET | Freq: Every day | ORAL | Status: DC
  Administered 2018-12-04 – 2018-12-05 (×2): 150 mg via ORAL
  Filled 2018-12-03 (×2): qty 1

## 2018-12-03 MED ORDER — SODIUM CHLORIDE 0.9 % IV SOLN
INTRAVENOUS | Status: DC
  Administered 2018-12-03: 14:00:00 20 mL/h via INTRAVENOUS

## 2018-12-03 MED ORDER — ACETAMINOPHEN 325 MG PO TABS
650.0000 mg | ORAL_TABLET | Freq: Four times a day (QID) | ORAL | Status: DC | PRN
  Administered 2018-12-04: 06:00:00 650 mg via ORAL
  Filled 2018-12-03: qty 2

## 2018-12-03 MED ORDER — MORPHINE SULFATE (PF) 4 MG/ML IV SOLN
6.0000 mg | Freq: Once | INTRAVENOUS | Status: AC
  Administered 2018-12-03: 14:00:00 6 mg via INTRAVENOUS
  Filled 2018-12-03: qty 2

## 2018-12-03 MED ORDER — OXYCODONE HCL 5 MG PO TABS
10.0000 mg | ORAL_TABLET | Freq: Four times a day (QID) | ORAL | Status: DC
  Administered 2018-12-03: 10 mg via ORAL
  Filled 2018-12-03: qty 2

## 2018-12-03 MED ORDER — CYCLOBENZAPRINE HCL 10 MG PO TABS
10.0000 mg | ORAL_TABLET | Freq: Three times a day (TID) | ORAL | Status: DC | PRN
  Administered 2018-12-03 – 2018-12-05 (×3): 10 mg via ORAL
  Filled 2018-12-03 (×3): qty 1

## 2018-12-03 MED ORDER — SODIUM CHLORIDE 0.9 % IV SOLN
1.0000 g | INTRAVENOUS | Status: DC
  Administered 2018-12-03: 18:00:00 1 g via INTRAVENOUS
  Filled 2018-12-03: qty 10

## 2018-12-03 MED ORDER — POTASSIUM CHLORIDE CRYS ER 20 MEQ PO TBCR
60.0000 meq | EXTENDED_RELEASE_TABLET | Freq: Once | ORAL | Status: AC
  Administered 2018-12-03: 16:00:00 60 meq via ORAL
  Filled 2018-12-03: qty 3

## 2018-12-03 MED ORDER — PAROXETINE HCL 10 MG PO TABS
10.0000 mg | ORAL_TABLET | Freq: Every day | ORAL | Status: DC
  Administered 2018-12-04 – 2018-12-05 (×2): 10 mg via ORAL
  Filled 2018-12-03 (×2): qty 1

## 2018-12-03 MED ORDER — PHENTERMINE HCL 15 MG PO CAPS: 15.0000 mg | ORAL_CAPSULE | Freq: Every day | ORAL | Status: DC

## 2018-12-03 MED ORDER — ASPIRIN EC 81 MG PO TBEC
81.0000 mg | DELAYED_RELEASE_TABLET | Freq: Every day | ORAL | Status: DC
  Administered 2018-12-04 – 2018-12-05 (×2): 81 mg via ORAL
  Filled 2018-12-03 (×2): qty 1

## 2018-12-03 MED ORDER — OXYBUTYNIN CHLORIDE 5 MG PO TABS
5.0000 mg | ORAL_TABLET | Freq: Every day | ORAL | Status: DC
  Administered 2018-12-04 – 2018-12-05 (×2): 5 mg via ORAL
  Filled 2018-12-03 (×2): qty 1

## 2018-12-03 MED ORDER — ONDANSETRON HCL 4 MG PO TABS: 4.0000 mg | ORAL_TABLET | Freq: Four times a day (QID) | ORAL | Status: DC | PRN

## 2018-12-03 MED ORDER — POLYETHYLENE GLYCOL 3350 17 G PO PACK
17.0000 g | PACK | Freq: Every day | ORAL | Status: DC | PRN
  Filled 2018-12-03: qty 1

## 2018-12-03 MED ORDER — ENOXAPARIN SODIUM 60 MG/0.6ML ~~LOC~~ SOLN
60.0000 mg | Freq: Every day | SUBCUTANEOUS | Status: DC
  Administered 2018-12-03 – 2018-12-04 (×2): 60 mg via SUBCUTANEOUS
  Filled 2018-12-03 (×2): qty 0.6

## 2018-12-03 NOTE — ED Triage Notes (Signed)
Patient here from home with multiple complaints. Reports bilateral hand swelling and arm pain. States that she is unable to lift arm over head or put on a bra. Also reports left leg pain and swelling. Diagnosis of cellulitis. Also reports that she is on Lasix with no relief.

## 2018-12-03 NOTE — Progress Notes (Signed)
Incorrect order entered. Verified with Dr. Zenia Resides per patient request. Order changed from UE to LE.   June Leap, BS, RDMS, RVT

## 2018-12-03 NOTE — ED Notes (Signed)
ED TO INPATIENT HANDOFF REPORT  Name/Age/Gender Madison Wells 63 y.o. female  Code Status Code Status History    Date Active Date Inactive Code Status Order ID Comments User Context   02/03/2017 0512 02/04/2017 1857 Full Code [id number]  Toy Baker, MD Inpatient   04/21/2011 0455 04/24/2011 1252 Full Code [id number]  Wendall Mola, RN Inpatient   Advance Care Planning Activity      Home/SNF/Other Home  Chief Complaint decreased mobility; leg swelling  Level of Care/Admitting Diagnosis ED Disposition    ED Disposition Condition [facility] Area: [facility] [[id number]]  Level of Care: Med-Surg [16]  Covid Evaluation: Person Under Investigation (PUI)  Diagnosis: Cellulitis of left lower leg [397673]  Admitting Physician: Alma Friendly [[id number]]  Attending Physician: Alma Friendly [[id number]]  PT Class (Do Not Modify): Observation [104]  PT Acc Code (Do Not Modify): Observation [10022]       Medical History Past Medical History:  Diagnosis Date  . Angina   . Anxiety   . Bipolar 1 disorder ([place])   . Cervicalgia   . Chronic pain syndrome   . Diabetes mellitus without complication ([place])   . Disturbance of skin sensation   . Dizziness and giddiness   . Enthesopathy of hip region   . Headache   . Hypertension   . Lumbago   . Obesity   . Pain in joint, hand     Allergies No Known Allergies  IV Location/Drains/Wounds Patient Lines/Drains/Airways Status   Active Line/Drains/Airways    Name:   Placement date:   Placement time:   Site:   Days:   Peripheral IV 12/03/18 Right Antecubital   12/03/18    1413    Antecubital   less than 1   Wound Other (Comment) Leg Bilateral red,hot, swollen   -    -    Leg             Labs/Imaging Results for orders placed or performed during the hospital encounter of 12/03/18 (from the past 48 hour(s))  Urinalysis, Routine w reflex microscopic     Status: Abnormal    Collection Time: 12/03/18  1:47 PM  Result Value Ref Range   Color, Urine STRAW (A) YELLOW   APPearance CLEAR CLEAR   Specific Gravity, Urine 1.005 1.005 - 1.030   pH 8.0 5.0 - 8.0   Glucose, UA NEGATIVE NEGATIVE mg/dL   Hgb urine dipstick NEGATIVE NEGATIVE   Bilirubin Urine NEGATIVE NEGATIVE   Ketones, ur NEGATIVE NEGATIVE mg/dL   Protein, ur NEGATIVE NEGATIVE mg/dL   Nitrite NEGATIVE NEGATIVE   Leukocytes,Ua TRACE (A) NEGATIVE   WBC, UA 0-5 0 - 5 WBC/hpf   Bacteria, UA MANY (A) NONE SEEN   Squamous Epithelial / LPF 0-5 0 - 5    Comment: Performed at [facility], [place] [address] [place], [place] [address]  Lactic acid, plasma     Status: None   Collection Time: 12/03/18  2:29 PM  Result Value Ref Range   Lactic Acid, Venous 1.1 0.5 - 1.9 mmol/L    Comment: Performed at [facility], [place] [address] [place], [place] [address]  Comprehensive metabolic panel     Status: Abnormal   Collection Time: 12/03/18  2:29 PM  Result Value Ref Range   Sodium 137 135 - 145 mmol/L   Potassium 2.3 (LL) 3.5 - 5.1 mmol/L    Comment: CRITICAL RESULT CALLED TO, READ BACK  BY AND VERIFIED WITH: K ZULETA,RN 12/03/18 1540 RHOLMES    Chloride 92 (L) 98 - 111 mmol/L   CO2 31 22 - 32 mmol/L   Glucose, Bld 119 (H) 70 - 99 mg/dL   BUN 16 8 - 23 mg/dL   Creatinine, Ser 0.86 0.44 - 1.00 mg/dL   Calcium 11.1 (H) 8.9 - 10.3 mg/dL   Total Protein 7.7 6.5 - 8.1 g/dL   Albumin 4.0 3.5 - 5.0 g/dL   AST 20 15 - 41 U/L   ALT 17 0 - 44 U/L   Alkaline Phosphatase 92 38 - 126 U/L   Total Bilirubin 0.6 0.3 - 1.2 mg/dL   GFR calc non Af Amer >60 >60 mL/min   GFR calc Af Amer >60 >60 mL/min   Anion gap 14 5 - 15    Comment: Performed at [facility], [place] [address] [place], [place] [address]  CBC with Differential     Status: None   Collection Time: 12/03/18  2:29 PM  Result Value Ref Range   WBC 8.3 4.0 - 10.5 K/uL   RBC 4.45 3.87 - 5.11 MIL/uL    Hemoglobin 13.6 12.0 - 15.0 g/dL   HCT 41.3 36.0 - 46.0 %   MCV 92.8 80.0 - 100.0 fL   MCH 30.6 26.0 - 34.0 pg   MCHC 32.9 30.0 - 36.0 g/dL   RDW 13.2 11.5 - 15.5 %   Platelets 212 150 - 400 K/uL   nRBC 0.0 0.0 - 0.2 %   Neutrophils Relative % 70 %   Neutro Abs 5.8 1.7 - 7.7 K/uL   Lymphocytes Relative 21 %   Lymphs Abs 1.8 0.7 - 4.0 K/uL   Monocytes Relative 7 %   Monocytes Absolute 0.6 0.1 - 1.0 K/uL   Eosinophils Relative 1 %   Eosinophils Absolute 0.1 0.0 - 0.5 K/uL   Basophils Relative 0 %   Basophils Absolute 0.0 0.0 - 0.1 K/uL   Immature Granulocytes 1 %   Abs Immature Granulocytes 0.04 0.00 - 0.07 K/uL    Comment: Performed at [facility], [place] [address] [place], [place] [address]  Magnesium     Status: None   Collection Time: 12/03/18  2:29 PM  Result Value Ref Range   Magnesium 2.1 1.7 - 2.4 mg/dL    Comment: Performed at [facility], [place] [address], [place],  [address]  SARS Coronavirus 2 (CEPHEID - Performed in [place] hospital lab), Hosp Order     Status: None   Collection Time: 12/03/18  4:33 PM   Specimen: Nasopharyngeal Swab  Result Value Ref Range   SARS Coronavirus 2 NEGATIVE NEGATIVE    Comment: (NOTE) If result is NEGATIVE SARS-CoV-2 target nucleic acids are NOT DETECTED. The SARS-CoV-2 RNA is generally detectable in upper and lower  respiratory specimens during the acute phase of infection. The lowest  concentration of SARS-CoV-2 viral copies this assay can detect is 250  copies / mL. A negative result does not preclude SARS-CoV-2 infection  and should not be used as the sole basis for treatment or other  patient management decisions.  A negative result may occur with  improper specimen collection / handling, submission of specimen other  than nasopharyngeal swab, presence of viral mutation(s) within the  areas targeted by this assay, and inadequate number of viral copies  (<250 copies / mL). A  negative result must be combined with clinical  observations, patient history, and epidemiological  information. If result is POSITIVE SARS-CoV-2 target nucleic acids are DETECTED. The SARS-CoV-2 RNA is generally detectable in upper and lower  respiratory specimens dur ing the acute phase of infection.  Positive  results are indicative of active infection with SARS-CoV-2.  Clinical  correlation with patient history and other diagnostic information is  necessary to determine patient infection status.  Positive results do  not rule out bacterial infection or co-infection with other viruses. If result is PRESUMPTIVE POSTIVE SARS-CoV-2 nucleic acids MAY BE PRESENT.   A presumptive positive result was obtained on the submitted specimen  and confirmed on repeat testing.  While 2019 novel coronavirus  (SARS-CoV-2) nucleic acids may be present in the submitted sample  additional confirmatory testing may be necessary for epidemiological  and / or clinical management purposes  to differentiate between  SARS-CoV-2 and other Sarbecovirus currently known to infect humans.  If clinically indicated additional testing with an alternate test  methodology [phone]) is advised. The SARS-CoV-2 RNA is generally  detectable in upper and lower respiratory sp ecimens during the acute  phase of infection. The expected result is Negative. Fact Sheet for Patients:  StrictlyIdeas.no Fact Sheet for Healthcare Providers: BankingDealers.co.za This test is not yet approved or cleared by the [place] FDA and has been authorized for detection and/or diagnosis of SARS-CoV-2 by FDA under an Emergency Use Authorization (EUA).  This EUA will remain in effect (meaning this test can be used) for the duration of the COVID-19 declaration under Section 564(b)(1) of the Act, 21 U.S.C. section 360bbb-3(b)(1), unless the authorization is terminated or revoked sooner. Performed  at [facility], [place] [address], [place], [place] [address]    Vas [place] Lower Extremity Venous (dvt)  Result Date: 12/03/2018  Lower Venous Study Indications: Cellulitis.  Limitations: Body habitus. Comparison Study: 12/02/17 negative Performing Technologist: June Leap RDMS, RVT  Examination Guidelines: A complete evaluation includes B-mode imaging, spectral Doppler, color Doppler, and power Doppler as needed of all accessible portions of each vessel. Bilateral testing is considered an integral part of a complete examination. Limited examinations for reoccurring indications may be performed as noted.  +---------+---------------+---------+-----------+----------+-------+ LEFT     CompressibilityPhasicitySpontaneityPropertiesSummary +---------+---------------+---------+-----------+----------+-------+ CFV      Full           Yes      Yes                          +---------+---------------+---------+-----------+----------+-------+ SFJ      Full                                                 +---------+---------------+---------+-----------+----------+-------+ FV Prox  Full                                                 +---------+---------------+---------+-----------+----------+-------+ FV Mid   Full                                                 +---------+---------------+---------+-----------+----------+-------+ FV DistalFull                                                 +---------+---------------+---------+-----------+----------+-------+  PFV      Full                                                 +---------+---------------+---------+-----------+----------+-------+ POP      Full           Yes      Yes                          +---------+---------------+---------+-----------+----------+-------+ PTV      Full                                                 +---------+---------------+---------+-----------+----------+-------+ PERO      Full                                                 +---------+---------------+---------+-----------+----------+-------+     Summary: Left: There is no evidence of deep vein thrombosis in the lower extremity. No cystic structure found in the popliteal fossa. Ultrasound characteristics of enlarged lymph nodes noted in the groin.  *See table(s) above for measurements and observations.    Preliminary     Pending Labs Unresulted Labs (From admission, onward)    Start     Ordered   12/04/18 0500  Hemoglobin A1c  Tomorrow morning,   R    Comments: To assess prior glycemic control    12/03/18 1847   12/03/18 1345  Culture, blood (Routine X 2) w Reflex to ID Panel  BLOOD CULTURE X 2,   R (with STAT occurrences)     12/03/18 1344   Signed and Held  Basic metabolic panel  Tomorrow morning,   R     Signed and Held   Signed and Held  CBC  Tomorrow morning,   R     Signed and Held          Vitals/Pain Today's Vitals   12/03/18 1545 12/03/18 1600 12/03/18 1630 12/03/18 1839  BP:   (!) 105/93   Pulse: 81 77 90   Resp:      Temp:      TempSrc:      SpO2: 95% 95% 97%   Weight:    124.7 kg  Height:    5\' 4"  (1.626 m)  PainSc:        Isolation Precautions No active isolations  Medications Medications  sodium chloride flush (NS) 0.9 % injection 3 mL (has no administration in time range)  0.9 %  sodium chloride infusion (20 mL/hr Intravenous New Bag/Given 12/03/18 1415)  aspirin EC tablet 81 mg (has no administration in time range)  buPROPion (WELLBUTRIN XL) 24 hr tablet 150 mg (has no administration in time range)  cyclobenzaprine (FLEXERIL) tablet 10 mg (has no administration in time range)  gabapentin (NEURONTIN) capsule 300 mg (has no administration in time range)  oxybutynin (DITROPAN) tablet 5 mg (has no administration in time range)  oxyCODONE HCl TABA 10 mg (has no administration in time range)  PARoxetine (PAXIL) tablet 10 mg (has no administration in time range)   phentermine capsule  15 mg (has no administration in time range)  Suvorexant TABS 1 tablet (has no administration in time range)  cefTRIAXone (ROCEPHIN) 1 g in sodium chloride 0.9 % 100 mL IVPB (1 g Intravenous New Bag/Given 12/03/18 1817)  insulin aspart (novoLOG) injection 0-9 Units (has no administration in time range)  nicotine (NICODERM CQ - dosed in mg/24 hours) patch 14 mg (has no administration in time range)  vancomycin (VANCOCIN) IVPB 1000 mg/200 mL premix (0 mg Intravenous Stopped 12/03/18 1603)  morphine 4 MG/ML injection 6 mg (6 mg Intravenous Given 12/03/18 1415)  potassium chloride SA (K-DUR) CR tablet 60 mEq (60 mEq Oral Given 12/03/18 1603)  potassium chloride 10 mEq in 100 mL IVPB (0 mEq Intravenous Stopped 12/03/18 1812)    Mobility walks

## 2018-12-03 NOTE — Progress Notes (Signed)
Pharmacy Antibiotic Note  Madison Wells is a 63 y.o. female presented to the ED on 12/03/2018 with c/o left leg welling and pai. LE doppler was negative for DVT.  To start vancomycin for cellulitis.  Plan: - vancomycin 1 gm given in the ED at 1451 on 7/12, then 1500 mg IV q24h for est AUC 452 (next dose due at 0300 on 7/13) - change ceftriaxone to 2gm IV q24h d/t weight >100kg _____________________________    Temp (24hrs), Avg:98 F (36.7 C), Min:98 F (36.7 C), Max:98 F (36.7 C)  Recent Labs  Lab 12/03/18 1429  WBC 8.3  CREATININE 0.86  LATICACIDVEN 1.1    CrCl cannot be calculated (Unknown ideal weight.).    No Known Allergies   Thank you for allowing pharmacy to be a part of this patient's care.  Lynelle Doctor 12/03/2018 5:51 PM

## 2018-12-03 NOTE — ED Provider Notes (Signed)
[facility] DEPT Provider Note   CSN: [id number] Arrival date & time: 12/03/18  5277     History   Chief Complaint Chief Complaint  Patient presents with  . Arm Pain  . Leg Swelling  . Hand Problem    HPI Madison Wells is a 63 y.o. female.     63 year old female presents with 3 days of left lower extremity swelling and erythema.  She is also noted diffuse myalgias without fever.  No cough or congestion.  She has not been short of breath.  Denies any urinary symptoms.  Did have pain in her arms and legs but now is mostly in her leg.  Denies any history of trauma.  No treatment use prior to arrival.  Nothing makes her symptoms better or worse.     Past Medical History:  Diagnosis Date  . Angina   . Anxiety   . Bipolar 1 disorder ([place])   . Cervicalgia   . Chronic pain syndrome   . Diabetes mellitus without complication ([place])   . Disturbance of skin sensation   . Dizziness and giddiness   . Enthesopathy of hip region   . Headache   . Hypertension   . Lumbago   . Obesity   . Pain in joint, hand     Patient Active Problem List   Diagnosis Date Noted  . Cellulitis of left lower extremity 09/21/2018  . Tinea corporis 03/22/2018  . Excessive sweating 03/22/2018  . Elevated troponin 02/03/2017  . UTI (urinary tract infection) 02/03/2017  . AKI (acute kidney injury) ([place]) 02/02/2017  . Dehydration 02/02/2017  . Leukocytosis 02/02/2017  . DM (diabetes mellitus), type 2 ([place]) 02/02/2017  . Hypokalemia 02/02/2017  . Elevated lactic acid level 02/02/2017  . Hyponatremia 02/02/2017  . Right Achilles tendinitis 11/29/2011  . Bilateral knee pain 09/13/2011  . Low back pain 09/13/2011  . Carpal tunnel syndrome 08/13/2011  . Leg swelling 04/21/2011  . Obesity   . Hypertension   . Anxiety   . Bipolar 1 disorder [facility])     Past Surgical History:  Procedure Laterality Date  . CESAREAN SECTION    . KNEE ARTHROPLASTY    .  TONSILLECTOMY       OB History    Gravida  4   Para  4   Term  4   Preterm      AB      Living  4     SAB      TAB      Ectopic      Multiple      Live Births               Home Medications    Prior to Admission medications   Medication Sig Start Date End Date Taking? Authorizing Provider  alendronate (FOSAMAX) 70 MG tablet TAKE 1 TABLET EVERY WEEK IN THE MORNING AT LEAST 30 MINS BEFORE FIRST FOOD/BEVERAGE/OR MEDS OF DAY Patient taking differently: Take 70 mg by mouth once a week.  10/30/18   [name], FNP  aspirin EC 81 MG tablet Take 81 mg by mouth daily.    [provider]  cyclobenzaprine (FLEXERIL) 10 MG tablet Take 10 mg by mouth 3 (three) times daily as needed for muscle spasms.    [provider]  furosemide (LASIX) 40 MG tablet Take 1 tablet (40 mg total) by mouth daily. 09/04/18   [name], FNP  gabapentin (NEURONTIN) 300 MG capsule TAKE 1  CAPSULE BY MOUTH THREE TIMES A DAY Patient taking differently: Take 300 mg by mouth 3 (three) times daily.  10/30/18   [name], FNP  hydrochlorothiazide (HYDRODIURIL) 12.5 MG tablet TAKE 1 TABLET BY MOUTH EVERY DAY Patient taking differently: Take 12.5 mg by mouth daily.  09/05/18   [name], FNP  ibuprofen (ADVIL,MOTRIN) 800 MG tablet Take 800 mg by mouth 3 (three) times daily.    [provider]  ketoconazole (NIZORAL) 2 % shampoo APPLY DAILY, LATHER, LEAVE IN PLACE FOR 5 MINUTES THEN RINSE OFF WITH WATER 09/13/17   [provider]  metFORMIN (GLUCOPHAGE) 500 MG tablet TAKE 1 TABLET BY MOUTH TWICE A DAY WITH FOOD Patient taking differently: Take 500 mg by mouth 2 (two) times daily with a meal.  09/12/18   [name], FNP  mometasone (ELOCON) 0.1 % cream APPLY TO AFFECTED AREA A THIN LAYER DAILY Patient taking differently: Apply 1 application topically daily.  08/31/18   [name], FNP  Multiple Vitamins-Minerals (MULTIVITAMIN WITH MINERALS) tablet Take 1 tablet by  mouth daily.    [provider]  oxybutynin (DITROPAN) 5 MG tablet Take 1 tablet (5 mg total) by mouth daily. 08/31/18   [name], FNP  oxyCODONE HCl 15 MG TABA Take 10 mg by mouth 4 (four) times daily.     [provider]  PARoxetine (PAXIL) 10 MG tablet TAKE 1 TABLET EVERY DAY Patient taking differently: Take 10 mg by mouth daily.  07/03/18   [name], FNP  potassium chloride SA (KLOR-CON M20) 20 MEQ tablet TAKE 1 TABLET BY MOUTH WITH FOOD Patient taking differently: Take 20 mEq by mouth daily.  11/30/18   [name], FNP  Suvorexant (BELSOMRA) 15 MG TABS Take 1 tablet by mouth at bedtime as needed. 07/26/18   [name], FNP  Vitamin D, Ergocalciferol, (DRISDOL) 50000 units CAPS capsule Take 50,000 Units by mouth every 7 (seven) days.    [provider]    Family History Family History  Problem Relation Age of Onset  . Alcohol abuse Mother   . Alcohol abuse Father   . Alcohol abuse Sister   . Alcohol abuse Brother     Social History Social History   Tobacco Use  . Smoking status: Current Every Day Smoker    Packs/day: 0.50    Years: 40.00    Pack years: 20.00    Types: Cigarettes  . Smokeless tobacco: Never Used  Substance Use Topics  . Alcohol use: No    Alcohol/week: 0.0 standard drinks  . Drug use: No     Allergies   Patient has no known allergies.   Review of Systems Review of Systems  All other systems reviewed and are negative.    Physical Exam Updated Vital Signs BP 121/79 (BP Location: Left Arm)   Pulse 82   Temp 98 F (36.7 C) (Oral)   Resp 18   LMP 08/13/2011   SpO2 95%   Physical Exam Vitals signs and nursing note reviewed.  Constitutional:      General: She is not in acute distress.    Appearance: Normal appearance. She is well-developed. She is not toxic-appearing.  HENT:     Head: Normocephalic and atraumatic.  Eyes:     General: Lids are normal.     Conjunctiva/sclera: Conjunctivae normal.      Pupils: Pupils are equal, round, and reactive to light.  Neck:     Musculoskeletal: Normal range of motion and neck supple.  Thyroid: No thyroid mass.     Trachea: No tracheal deviation.  Cardiovascular:     Rate and Rhythm: Normal rate and regular rhythm.     Heart sounds: Normal heart sounds. No murmur. No gallop.   Pulmonary:     Effort: Pulmonary effort is normal. No respiratory distress.     Breath sounds: Normal breath sounds. No stridor. No decreased breath sounds, wheezing, rhonchi or rales.  Abdominal:     General: Bowel sounds are normal. There is no distension.     Palpations: Abdomen is soft.     Tenderness: There is no abdominal tenderness. There is no rebound.  Musculoskeletal: Normal range of motion.        General: No tenderness.       Legs:  Skin:    General: Skin is warm and dry.     Findings: No abrasion or rash.  Neurological:     Mental Status: She is alert and oriented to person, place, and time.     GCS: GCS eye subscore is 4. GCS verbal subscore is 5. GCS motor subscore is 6.     Cranial Nerves: No cranial nerve deficit.     Sensory: No sensory deficit.  Psychiatric:        Speech: Speech normal.        Behavior: Behavior normal.      ED Treatments / Results  Labs (all labs ordered are listed, but only abnormal results are displayed) Labs Reviewed  CULTURE, BLOOD (ROUTINE X 2)  CULTURE, BLOOD (ROUTINE X 2)  LACTIC ACID, PLASMA  LACTIC ACID, PLASMA  COMPREHENSIVE METABOLIC PANEL  CBC WITH DIFFERENTIAL/PLATELET  URINALYSIS, ROUTINE W REFLEX MICROSCOPIC    EKG None  Radiology No results found.  Procedures Procedures (including critical care time)  Medications Ordered in ED Medications  sodium chloride flush (NS) 0.9 % injection 3 mL (has no administration in time range)  0.9 %  sodium chloride infusion (has no administration in time range)  vancomycin (VANCOCIN) IVPB 1000 mg/200 mL premix (has no administration in time range)      Initial Impression / Assessment and Plan / ED Course  I have reviewed the triage vital signs and the nursing notes.  Pertinent labs & imaging results that were available during my care of the patient were reviewed by me and considered in my medical decision making (see chart for details).        Pt started on vancomycin for suspected cellulitis Le doppler neg Pt with severe hypokalemia which could explain her muscle cramps Oral and iv potassium ordered Will admit to triad  Final Clinical Impressions(s) / ED Diagnoses   Final diagnoses:  None    ED Discharge Orders    None       Lacretia Leigh, MD 12/03/18 1557

## 2018-12-03 NOTE — Progress Notes (Signed)
LLE venous duplex       has been completed. Preliminary results can be found under CV proc through chart review. Shamari Lofquist, BS, RDMS, RVT    

## 2018-12-03 NOTE — Progress Notes (Signed)
Rx Brief note: Lovenox  Wt= 124 kg, BMI = 47 CrCl~ 88 ml/min  Increase Lovenox to 60 mg daily (~ 0.5 mg/kg) in pt with BMI > 30   Thanks Dorrene German 12/03/2018 9:28 PM

## 2018-12-03 NOTE — H&P (Addendum)
History and Physical  [name]:[id number] DOB: 1955/08/12 DOA: 12/03/2018   Patient coming from: Home & is able to ambulate  Chief Complaint: LLE swelling and erythema  HPI: Madison Wells is a 63 y.o. female with medical history significant for hypertension, prediabetes, obesity, chronic pain syndrome, depression, presents to the ED complaining of left lower extremity swelling, warmth and erythema for the past few days.  Patient had been prescribed Lasix for the swelling with no significant improvement.  Patient reported some generalized cramping.  Patient denies any fever/chills, chest pain, abdominal pain, nausea/vomiting/diarrhea, shortness of breath.  Upon chart review, patient noted to have cellulitis of the same left lower extremity back in 08/2018 and was prescribed Keflex.  Patient does not recall ever having cellulitis in April and given a new prescription, so unsure if patient took the medications back then.    ED Course: Patient remained hemodynamically stable, labs notable for severe hypokalemia, lactic acid 1.1.  EDP gave potassium supplements, started patient on IV vancomycin. Will admit patient for observation due to extensive area of cellulitis  Review of Systems: Review of systems are otherwise negative   Past Medical History:  Diagnosis Date  . Angina   . Anxiety   . Bipolar 1 disorder ([place])   . Cervicalgia   . Chronic pain syndrome   . Diabetes mellitus without complication ([place])   . Disturbance of skin sensation   . Dizziness and giddiness   . Enthesopathy of hip region   . Headache   . Hypertension   . Lumbago   . Obesity   . Pain in joint, hand    Past Surgical History:  Procedure Laterality Date  . CESAREAN SECTION    . KNEE ARTHROPLASTY    . TONSILLECTOMY      Social History:  reports that she has been smoking cigarettes. She has a 20.00 pack-year smoking history. She has never used smokeless tobacco. She reports that she does not  drink alcohol or use drugs.   No Known Allergies  Family History  Problem Relation Age of Onset  . Alcohol abuse Mother   . Alcohol abuse Father   . Alcohol abuse Sister   . Alcohol abuse Brother       Prior to Admission medications   Medication Sig Start Date End Date Taking? Authorizing Provider  alendronate (FOSAMAX) 70 MG tablet TAKE 1 TABLET EVERY WEEK IN THE MORNING AT LEAST 30 MINS BEFORE FIRST FOOD/BEVERAGE/OR MEDS OF DAY Patient taking differently: Take 70 mg by mouth once a week.  10/30/18  Yes [name], FNP  aspirin EC 81 MG tablet Take 81 mg by mouth daily.   Yes [provider]  buPROPion (WELLBUTRIN XL) 150 MG 24 hr tablet Take 150 mg by mouth daily. 10/30/18  Yes [provider]  cyclobenzaprine (FLEXERIL) 10 MG tablet Take 10 mg by mouth 3 (three) times daily as needed for muscle spasms.   Yes [provider]  furosemide (LASIX) 40 MG tablet Take 1 tablet (40 mg total) by mouth daily. 09/04/18  Yes [name], FNP  gabapentin (NEURONTIN) 300 MG capsule TAKE 1 CAPSULE BY MOUTH THREE TIMES A DAY Patient taking differently: Take 300 mg by mouth 3 (three) times daily.  10/30/18  Yes [name], FNP  hydrochlorothiazide (HYDRODIURIL) 12.5 MG tablet TAKE 1 TABLET BY MOUTH EVERY DAY Patient taking differently: Take 12.5 mg by mouth daily.  09/05/18  Yes [name], FNP  ibuprofen (ADVIL,MOTRIN) 800 MG tablet Take  800 mg by mouth 3 (three) times daily.   Yes [provider]  ketoconazole (NIZORAL) 2 % shampoo Apply 1 application topically 2 (two) times a week.  09/13/17  Yes [provider]  metFORMIN (GLUCOPHAGE) 500 MG tablet TAKE 1 TABLET BY MOUTH TWICE A DAY WITH FOOD Patient taking differently: Take 500 mg by mouth 2 (two) times daily with a meal.  09/12/18  Yes [name], FNP  mometasone (ELOCON) 0.1 % cream APPLY TO AFFECTED AREA A THIN LAYER DAILY Patient taking differently: Apply 1 application topically daily.   08/31/18  Yes [name], FNP  Multiple Vitamins-Minerals (MULTIVITAMIN WITH MINERALS) tablet Take 1 tablet by mouth daily.   Yes [provider]  oxybutynin (DITROPAN) 5 MG tablet Take 1 tablet (5 mg total) by mouth daily. 08/31/18  Yes [name], FNP  oxyCODONE HCl 15 MG TABA Take 10 mg by mouth 4 (four) times daily.    Yes [provider]  PARoxetine (PAXIL) 10 MG tablet TAKE 1 TABLET EVERY DAY Patient taking differently: Take 10 mg by mouth daily.  07/03/18  Yes [name], FNP  phentermine 15 MG capsule Take 15 mg by mouth daily. 11/14/18  Yes [provider]  potassium chloride SA (KLOR-CON M20) 20 MEQ tablet TAKE 1 TABLET BY MOUTH WITH FOOD Patient taking differently: Take 20 mEq by mouth daily.  11/30/18  Yes [name], FNP  Suvorexant (BELSOMRA) 15 MG TABS Take 1 tablet by mouth at bedtime as needed. Patient taking differently: Take 1 tablet by mouth at bedtime as needed (sleep).  07/26/18  Yes [name], FNP  Vitamin D, Ergocalciferol, (DRISDOL) 50000 units CAPS capsule Take 50,000 Units by mouth every 7 (seven) days.   Yes [provider]    Physical Exam: BP (!) 105/93   Pulse 90   Temp 98 F (36.7 C) (Oral)   Resp 17   LMP 08/13/2011   SpO2 97%   General: NAD Eyes: Normal ENT: Normal Neck: Supple Cardiovascular: S1, S2 present Respiratory: CTA B Abdomen: Soft, nontender, nondistended, bowel sounds present Skin: Left lower extremity with erythema Musculoskeletal: Left lower extremity with erythema, swelling, warmth to touch.  Right lower extremity with no edema Psychiatric: Normal mood Neurologic: No focal neurologic deficit noted          Labs on Admission:  Basic Metabolic Panel: Recent Labs  Lab 12/03/18 1429  NA 137  K 2.3*  CL 92*  CO2 31  GLUCOSE 119*  BUN 16  CREATININE 0.86  CALCIUM 11.1*  MG 2.1   Liver Function Tests: Recent Labs  Lab 12/03/18 1429  AST 20  ALT 17  ALKPHOS 92  BILITOT 0.6   PROT 7.7  ALBUMIN 4.0   No results for input(s): LIPASE, AMYLASE in the last 168 hours. No results for input(s): AMMONIA in the last 168 hours. CBC: Recent Labs  Lab 12/03/18 1429  WBC 8.3  NEUTROABS 5.8  HGB 13.6  HCT 41.3  MCV 92.8  PLT 212   Cardiac Enzymes: No results for input(s): CKTOTAL, CKMB, CKMBINDEX, TROPONINI in the last 168 hours.  BNP (last 3 results) No results for input(s): BNP in the last 8760 hours.  ProBNP (last 3 results) No results for input(s): PROBNP in the last 8760 hours.  CBG: No results for input(s): GLUCAP in the last 168 hours.  Radiological Exams on Admission: Vas [place] Lower Extremity Venous (dvt)  Result Date: 12/03/2018  Lower Venous Study Indications: Cellulitis.  Limitations: Body habitus.  Comparison Study: 12/02/17 negative Performing Technologist: June Leap RDMS, RVT  Examination Guidelines: A complete evaluation includes B-mode imaging, spectral Doppler, color Doppler, and power Doppler as needed of all accessible portions of each vessel. Bilateral testing is considered an integral part of a complete examination. Limited examinations for reoccurring indications may be performed as noted.  +---------+---------------+---------+-----------+----------+-------+ LEFT     CompressibilityPhasicitySpontaneityPropertiesSummary +---------+---------------+---------+-----------+----------+-------+ CFV      Full           Yes      Yes                          +---------+---------------+---------+-----------+----------+-------+ SFJ      Full                                                 +---------+---------------+---------+-----------+----------+-------+ FV Prox  Full                                                 +---------+---------------+---------+-----------+----------+-------+ FV Mid   Full                                                 +---------+---------------+---------+-----------+----------+-------+ FV DistalFull                                                  +---------+---------------+---------+-----------+----------+-------+ PFV      Full                                                 +---------+---------------+---------+-----------+----------+-------+ POP      Full           Yes      Yes                          +---------+---------------+---------+-----------+----------+-------+ PTV      Full                                                 +---------+---------------+---------+-----------+----------+-------+ PERO     Full                                                 +---------+---------------+---------+-----------+----------+-------+     Summary: Left: There is no evidence of deep vein thrombosis in the lower extremity. No cystic structure found in the popliteal fossa. Ultrasound characteristics of enlarged lymph nodes noted in the groin.  *See table(s) above for measurements and observations.    Preliminary     EKG:  None  Assessment/Plan Present on Admission: . Cellulitis of left lower leg . Obesity . Hypertension . Anxiety . Hypokalemia . Low back pain  Principal Problem:   Cellulitis of left lower leg Active Problems:   Obesity   Hypertension   Anxiety   Low back pain   Hypokalemia   Cellulitis of the left lower extremity Afebrile, no leukocytosis Lactic acid within normal limits BC x2 pending Venous Doppler negative for DVT Continue IV vancomycin, add ceftriaxone, please plan to de-escalate as soon as possible Monitor closely  Hypokalemia Replace PRN  Hypertension BP somewhat soft Hold home HCTZ, Lasix  Prediabetes Hold home metformin for now SSI, Accu-Cheks, hypoglycemic protocol  Chronic pain syndrome Continue gabapentin, Flexeril, oxycodone  Depression Continue Paxil, Wellbutrin  Morbid obesity Lifestyle modification advised Continue phentermine  Tobacco abuse Advised to quit Nicotine patch ordered      DVT  prophylaxis: Lovenox  Code Status: Full  Family Communication: None at bedside  Disposition Plan: Home  Consults called: None  Admission status: Observation    Alma Friendly MD [facility]   If 7PM-7AM, please contact night-coverage [email or web address]  12/03/2018, 6:27 PM

## 2018-12-04 DIAGNOSIS — Z79899 Other long term (current) drug therapy: Secondary | ICD-10-CM | POA: Diagnosis not present

## 2018-12-04 DIAGNOSIS — I10 Essential (primary) hypertension: Secondary | ICD-10-CM | POA: Diagnosis present

## 2018-12-04 DIAGNOSIS — L03116 Cellulitis of left lower limb: Secondary | ICD-10-CM | POA: Diagnosis present

## 2018-12-04 DIAGNOSIS — F17210 Nicotine dependence, cigarettes, uncomplicated: Secondary | ICD-10-CM | POA: Diagnosis present

## 2018-12-04 DIAGNOSIS — E876 Hypokalemia: Secondary | ICD-10-CM | POA: Diagnosis not present

## 2018-12-04 DIAGNOSIS — R7303 Prediabetes: Secondary | ICD-10-CM | POA: Diagnosis present

## 2018-12-04 DIAGNOSIS — Z716 Tobacco abuse counseling: Secondary | ICD-10-CM | POA: Diagnosis not present

## 2018-12-04 DIAGNOSIS — Z6841 Body Mass Index (BMI) 40.0 and over, adult: Secondary | ICD-10-CM | POA: Diagnosis not present

## 2018-12-04 DIAGNOSIS — F319 Bipolar disorder, unspecified: Secondary | ICD-10-CM | POA: Diagnosis present

## 2018-12-04 DIAGNOSIS — G894 Chronic pain syndrome: Secondary | ICD-10-CM | POA: Diagnosis present

## 2018-12-04 DIAGNOSIS — M545 Low back pain: Secondary | ICD-10-CM | POA: Diagnosis present

## 2018-12-04 DIAGNOSIS — Z811 Family history of alcohol abuse and dependence: Secondary | ICD-10-CM | POA: Diagnosis not present

## 2018-12-04 DIAGNOSIS — Z7984 Long term (current) use of oral hypoglycemic drugs: Secondary | ICD-10-CM | POA: Diagnosis not present

## 2018-12-04 DIAGNOSIS — F419 Anxiety disorder, unspecified: Secondary | ICD-10-CM | POA: Diagnosis present

## 2018-12-04 DIAGNOSIS — Z1159 Encounter for screening for other viral diseases: Secondary | ICD-10-CM | POA: Diagnosis not present

## 2018-12-04 DIAGNOSIS — Z7982 Long term (current) use of aspirin: Secondary | ICD-10-CM | POA: Diagnosis not present

## 2018-12-04 DIAGNOSIS — Z79891 Long term (current) use of opiate analgesic: Secondary | ICD-10-CM | POA: Diagnosis not present

## 2018-12-04 LAB — CBC
HCT: 39.9 % (ref 36.0–46.0)
Hemoglobin: 12.9 g/dL (ref 12.0–15.0)
MCH: 30.5 pg (ref 26.0–34.0)
MCHC: 32.3 g/dL (ref 30.0–36.0)
MCV: 94.3 fL (ref 80.0–100.0)
Platelets: 198 10*3/uL (ref 150–400)
RBC: 4.23 MIL/uL (ref 3.87–5.11)
RDW: 13.2 % (ref 11.5–15.5)
WBC: 6.9 10*3/uL (ref 4.0–10.5)
nRBC: 0 % (ref 0.0–0.2)

## 2018-12-04 LAB — GLUCOSE, CAPILLARY
Glucose-Capillary: 131 mg/dL — ABNORMAL HIGH (ref 70–99)
Glucose-Capillary: 134 mg/dL — ABNORMAL HIGH (ref 70–99)
Glucose-Capillary: 132 mg/dL — ABNORMAL HIGH (ref 70–99)

## 2018-12-04 LAB — BASIC METABOLIC PANEL
Anion gap: 12 (ref 5–15)
BUN: 13 mg/dL (ref 8–23)
CO2: 29 mmol/L (ref 22–32)
Calcium: 10.8 mg/dL — ABNORMAL HIGH (ref 8.9–10.3)
Chloride: 100 mmol/L (ref 98–111)
Creatinine, Ser: 0.8 mg/dL (ref 0.44–1.00)
GFR calc Af Amer: >60 mL/min (ref >60)
GFR calc non Af Amer: >60 mL/min (ref >60)
Glucose, Bld: 124 mg/dL — ABNORMAL HIGH (ref 70–99)
Potassium: 2.8 mmol/L — ABNORMAL LOW (ref 3.5–5.1)
Sodium: 141 mmol/L (ref 135–145)

## 2018-12-04 LAB — HEMOGLOBIN A1C
Hgb A1c MFr Bld: 6 % — ABNORMAL HIGH (ref 4.8–5.6)
Mean Plasma Glucose: 125.5 mg/dL

## 2018-12-04 MED ORDER — POTASSIUM CHLORIDE CRYS ER 20 MEQ PO TBCR
40.0000 meq | EXTENDED_RELEASE_TABLET | Freq: Two times a day (BID) | ORAL | Status: DC
  Administered 2018-12-04 – 2018-12-05 (×3): 40 meq via ORAL
  Filled 2018-12-04 (×3): qty 2

## 2018-12-04 MED ORDER — OXYCODONE HCL 5 MG PO TABS
15.0000 mg | ORAL_TABLET | Freq: Four times a day (QID) | ORAL | Status: DC
  Administered 2018-12-04 – 2018-12-05 (×4): 15 mg via ORAL
  Filled 2018-12-04 (×4): qty 3

## 2018-12-04 MED ORDER — POTASSIUM CHLORIDE 10 MEQ/100ML IV SOLN
10.0000 meq | INTRAVENOUS | Status: AC
  Administered 2018-12-04 (×2): 10 meq via INTRAVENOUS
  Filled 2018-12-04: qty 100

## 2018-12-04 MED ORDER — OXYCODONE HCL 5 MG PO TABS
15.0000 mg | ORAL_TABLET | Freq: Four times a day (QID) | ORAL | Status: DC
  Administered 2018-12-04: 10:00:00 15 mg via ORAL
  Filled 2018-12-04: qty 3

## 2018-12-04 MED ORDER — IBUPROFEN 400 MG PO TABS: 600.0000 mg | ORAL_TABLET | Freq: Three times a day (TID) | ORAL | Status: DC | PRN

## 2018-12-04 NOTE — Progress Notes (Signed)
Marland Kitchen  PROGRESS NOTE    Madison Wells  HER:[id number] DOB: 22-Apr-1956 DOA: 12/03/2018 PCP: [name], FNP   Brief Narrative:   Madison Wells is a 63 y.o. female with medical history significant for hypertension, prediabetes, obesity, chronic pain syndrome, depression, presents to the ED complaining of left lower extremity swelling, warmth and erythema for the past few days.  Patient had been prescribed Lasix for the swelling with no significant improvement.  Patient reported some generalized cramping.  Patient denies any fever/chills, chest pain, abdominal pain, nausea/vomiting/diarrhea, shortness of breath.  Upon chart review, patient noted to have cellulitis of the same left lower extremity back in 08/2018 and was prescribed Keflex.  Patient does not recall ever having cellulitis in April and given a new prescription, so unsure if patient took the medications back then.   Assessment & Plan:   Principal Problem:   Cellulitis of left lower leg Active Problems:   Obesity   Hypertension   Anxiety   Low back pain   Hypokalemia   Cellulitis of the left lower extremity     - Afebrile, no leukocytosis     - Lactic acid within normal limits     - Bld Cx pending     - Venous Doppler negative for DVT     - Continue IV vancomycin, ceftriaxone; will likely be able to transition to clinda at discharge  Hypokalemia     - Replete, monitor (will get IV and PO today)  Hypertension     - BP ok; resume home lasix     - Hold home HCTZ  Prediabetes     - Hold home metformin for now     - SSI, Accu-Cheks, hypoglycemic protocol  Chronic pain syndrome     - Continue gabapentin, Flexeril, oxycodone  Depression     - Continue Paxil, Wellbutrin  Morbid obesity     - Lifestyle modification advised     - Continue phentermine  Tobacco abuse     - Advised to quit     - Nicotine patch ordered   DVT prophylaxis: lovenox Code Status: FULL   Disposition Plan:  TBD   Antimicrobials:   Vancomycin, rocephin    Subjective: "Are you my doctor?"  Objective: Vitals:   12/03/18 1839 12/03/18 2035 12/03/18 2053 12/04/18 0452  BP:   128/61 (!) 157/73  Pulse:   80 83  Resp:   18 16  Temp:   98.2 F (36.8 C) 97.9 F (36.6 C)  TempSrc:   Oral   SpO2:   97% 98%  Weight: 124.7 kg 119.1 kg    Height: 5\' 4"  (1.626 m) 5\' 4"  (1.626 m)      Intake/Output Summary (Last 24 hours) at 12/04/2018 1230 Last data filed at 12/04/2018 1100 Gross per 24 hour  Intake 1066.33 ml  Output 1202 ml  Net -135.67 ml   Filed Weights   12/03/18 1839 12/03/18 2035  Weight: 124.7 kg 119.1 kg    Examination:  General exam: 63 y.o. female Appears calm and comfortable  Respiratory system: Clear to auscultation. Respiratory effort normal. Cardiovascular system: S1 & S2 heard, RRR. No JVD, murmurs, rubs, gallops or clicks. No pedal edema. Gastrointestinal system: Abdomen is nondistended, soft and nontender. No organomegaly or masses felt. Normal bowel sounds heard, morbidly obese Central nervous system: Alert and oriented. No focal neurological deficits. Extremities: Symmetric 5 x 5 power. Skin: Erythema and edema of LLE extremity extending from upper shin through toes, some TTP  Psychiatry: Judgement and insight appear normal. Mood & affect appropriate.     Data Reviewed: I have personally reviewed following labs and imaging studies.  CBC: Recent Labs  Lab 12/03/18 1429 12/04/18 0328  WBC 8.3 6.9  NEUTROABS 5.8  --   HGB 13.6 12.9  HCT 41.3 39.9  MCV 92.8 94.3  PLT 212 664   Basic Metabolic Panel: Recent Labs  Lab 12/03/18 1429 12/04/18 0328  NA 137 141  K 2.3* 2.8*  CL 92* 100  CO2 31 29  GLUCOSE 119* 124*  BUN 16 13  CREATININE 0.86 0.80  CALCIUM 11.1* 10.8*  MG 2.1  --    GFR: Estimated Creatinine Clearance: 92.7 mL/min (by C-G formula based on SCr of 0.8 mg/dL). Liver Function Tests: Recent Labs  Lab 12/03/18 1429  AST 20  ALT 17   ALKPHOS 92  BILITOT 0.6  PROT 7.7  ALBUMIN 4.0   No results for input(s): LIPASE, AMYLASE in the last 168 hours. No results for input(s): AMMONIA in the last 168 hours. Coagulation Profile: No results for input(s): INR, PROTIME in the last 168 hours. Cardiac Enzymes: No results for input(s): CKTOTAL, CKMB, CKMBINDEX, TROPONINI in the last 168 hours. BNP (last 3 results) No results for input(s): PROBNP in the last 8760 hours. HbA1C: Recent Labs    12/04/18 0328  HGBA1C 6.0*   CBG: Recent Labs  Lab 12/03/18 2059 12/04/18 0723  GLUCAP 131* 131*   Lipid Profile: No results for input(s): CHOL, HDL, LDLCALC, TRIG, CHOLHDL, LDLDIRECT in the last 72 hours. Thyroid Function Tests: No results for input(s): TSH, T4TOTAL, FREET4, T3FREE, THYROIDAB in the last 72 hours. Anemia Panel: No results for input(s): VITAMINB12, FOLATE, FERRITIN, TIBC, IRON, RETICCTPCT in the last 72 hours. Sepsis Labs: Recent Labs  Lab 12/03/18 1429  LATICACIDVEN 1.1    Recent Results (from the past 240 hour(s))  SARS Coronavirus 2 (CEPHEID - Performed in [facility] hospital lab), Hosp Order     Status: None   Collection Time: 12/03/18  4:33 PM   Specimen: Nasopharyngeal Swab  Result Value Ref Range Status   SARS Coronavirus 2 NEGATIVE NEGATIVE Final    Comment: (NOTE) If result is NEGATIVE SARS-CoV-2 target nucleic acids are NOT DETECTED. The SARS-CoV-2 RNA is generally detectable in upper and lower  respiratory specimens during the acute phase of infection. The lowest  concentration of SARS-CoV-2 viral copies this assay can detect is 250  copies / mL. A negative result does not preclude SARS-CoV-2 infection  and should not be used as the sole basis for treatment or other  patient management decisions.  A negative result may occur with  improper specimen collection / handling, submission of specimen other  than nasopharyngeal swab, presence of viral mutation(s) within the  areas targeted by  this assay, and inadequate number of viral copies  (<250 copies / mL). A negative result must be combined with clinical  observations, patient history, and epidemiological information. If result is POSITIVE SARS-CoV-2 target nucleic acids are DETECTED. The SARS-CoV-2 RNA is generally detectable in upper and lower  respiratory specimens dur ing the acute phase of infection.  Positive  results are indicative of active infection with SARS-CoV-2.  Clinical  correlation with patient history and other diagnostic information is  necessary to determine patient infection status.  Positive results do  not rule out bacterial infection or co-infection with other viruses. If result is PRESUMPTIVE POSTIVE SARS-CoV-2 nucleic acids MAY BE PRESENT.   A presumptive positive result  was obtained on the submitted specimen  and confirmed on repeat testing.  While 2019 novel coronavirus  (SARS-CoV-2) nucleic acids may be present in the submitted sample  additional confirmatory testing may be necessary for epidemiological  and / or clinical management purposes  to differentiate between  SARS-CoV-2 and other Sarbecovirus currently known to infect humans.  If clinically indicated additional testing with an alternate test  methodology [phone]) is advised. The SARS-CoV-2 RNA is generally  detectable in upper and lower respiratory sp ecimens during the acute  phase of infection. The expected result is Negative. Fact Sheet for Patients:  StrictlyIdeas.no Fact Sheet for Healthcare Providers: BankingDealers.co.za This test is not yet approved or cleared by the [place] FDA and has been authorized for detection and/or diagnosis of SARS-CoV-2 by FDA under an Emergency Use Authorization (EUA).  This EUA will remain in effect (meaning this test can be used) for the duration of the COVID-19 declaration under Section 564(b)(1) of the Act, 21 U.S.C. section  360bbb-3(b)(1), unless the authorization is terminated or revoked sooner. Performed at [facility], [place] [address], [place], [place] [address]          Radiology Studies: Vas [place] Lower Extremity Venous (dvt)  Result Date: 12/03/2018  Lower Venous Study Indications: Cellulitis.  Limitations: Body habitus. Comparison Study: 12/02/17 negative Performing Technologist: June Leap RDMS, RVT  Examination Guidelines: A complete evaluation includes B-mode imaging, spectral Doppler, color Doppler, and power Doppler as needed of all accessible portions of each vessel. Bilateral testing is considered an integral part of a complete examination. Limited examinations for reoccurring indications may be performed as noted.  +---------+---------------+---------+-----------+----------+-------+  LEFT      Compressibility Phasicity Spontaneity Properties Summary  +---------+---------------+---------+-----------+----------+-------+  CFV       Full            Yes       Yes                             +---------+---------------+---------+-----------+----------+-------+  SFJ       Full                                                      +---------+---------------+---------+-----------+----------+-------+  FV Prox   Full                                                      +---------+---------------+---------+-----------+----------+-------+  FV Mid    Full                                                      +---------+---------------+---------+-----------+----------+-------+  FV Distal Full                                                      +---------+---------------+---------+-----------+----------+-------+  PFV  Full                                                      +---------+---------------+---------+-----------+----------+-------+  POP       Full            Yes       Yes                             +---------+---------------+---------+-----------+----------+-------+  PTV       Full                                                       +---------+---------------+---------+-----------+----------+-------+  PERO      Full                                                      +---------+---------------+---------+-----------+----------+-------+     Summary: Left: There is no evidence of deep vein thrombosis in the lower extremity. No cystic structure found in the popliteal fossa. Ultrasound characteristics of enlarged lymph nodes noted in the groin.  *See table(s) above for measurements and observations.    Preliminary         Scheduled Meds:  aspirin EC  81 mg Oral Daily   buPROPion  150 mg Oral Daily   enoxaparin (LOVENOX) injection  60 mg Subcutaneous QHS   gabapentin  300 mg Oral TID   insulin aspart  0-9 Units Subcutaneous TID WC   nicotine  14 mg Transdermal Daily   oxybutynin  5 mg Oral Daily   oxyCODONE  15 mg Oral Q6H   PARoxetine  10 mg Oral Daily   phentermine  15 mg Oral Daily   potassium chloride  40 mEq Oral BID   sodium chloride flush  3 mL Intravenous Once   Continuous Infusions:  sodium chloride 20 mL/hr (12/03/18 1415)   cefTRIAXone (ROCEPHIN)  IV     cefTRIAXone (ROCEPHIN)  IV     vancomycin 1,500 mg (12/04/18 0352)     LOS: 0 days    Time spent: 25 minutes spent in the coordination of care today.    Jonnie Finner, DO [facility] Pager [phone]  If 7PM-7AM, please contact night-coverage [email or web address] Password [facility] 12/04/2018, 12:30 PM

## 2018-12-04 NOTE — Plan of Care (Signed)
  Problem: Clinical Measurements: Goal: Ability to avoid or minimize complications of infection will improve Outcome: Progressing   Problem: Skin Integrity: Goal: Skin integrity will improve Outcome: Progressing   Problem: Education: Goal: Knowledge of General Education information will improve Description: Including pain rating scale, medication(s)/side effects and non-pharmacologic comfort measures Outcome: Progressing   Problem: Health Behavior/Discharge Planning: Goal: Ability to manage health-related needs will improve Outcome: Progressing   Problem: Clinical Measurements: Goal: Ability to maintain clinical measurements within normal limits will improve Outcome: Progressing Goal: Will remain free from infection Outcome: Progressing Goal: Diagnostic test results will improve Outcome: Progressing Goal: Respiratory complications will improve Outcome: Progressing Goal: Cardiovascular complication will be avoided Outcome: Progressing   

## 2018-12-05 LAB — RENAL FUNCTION PANEL
Albumin: 3.1 g/dL — ABNORMAL LOW (ref 3.5–5.0)
Anion gap: 12 (ref 5–15)
BUN: 9 mg/dL (ref 8–23)
CO2: 25 mmol/L (ref 22–32)
Calcium: 9.8 mg/dL (ref 8.9–10.3)
Chloride: 103 mmol/L (ref 98–111)
Creatinine, Ser: 0.72 mg/dL (ref 0.44–1.00)
GFR calc Af Amer: >60 mL/min (ref >60)
GFR calc non Af Amer: >60 mL/min (ref >60)
Glucose, Bld: 106 mg/dL — ABNORMAL HIGH (ref 70–99)
Phosphorus: 3.7 mg/dL (ref 2.5–4.6)
Potassium: 3.3 mmol/L — ABNORMAL LOW (ref 3.5–5.1)
Sodium: 140 mmol/L (ref 135–145)

## 2018-12-05 LAB — CBC WITH DIFFERENTIAL/PLATELET
Abs Immature Granulocytes: 0.05 10*3/uL (ref 0.00–0.07)
Basophils Absolute: 0 10*3/uL (ref 0.0–0.1)
Basophils Relative: 1 %
Eosinophils Absolute: 0.1 10*3/uL (ref 0.0–0.5)
Eosinophils Relative: 2 %
HCT: 38.6 % (ref 36.0–46.0)
Hemoglobin: 12.6 g/dL (ref 12.0–15.0)
Immature Granulocytes: 1 %
Lymphocytes Relative: 37 %
Lymphs Abs: 2.7 10*3/uL (ref 0.7–4.0)
MCH: 31 pg (ref 26.0–34.0)
MCHC: 32.6 g/dL (ref 30.0–36.0)
MCV: 94.8 fL (ref 80.0–100.0)
Monocytes Absolute: 0.6 10*3/uL (ref 0.1–1.0)
Monocytes Relative: 8 %
Neutro Abs: 3.8 10*3/uL (ref 1.7–7.7)
Neutrophils Relative %: 51 %
Platelets: 225 10*3/uL (ref 150–400)
RBC: 4.07 MIL/uL (ref 3.87–5.11)
RDW: 13.3 % (ref 11.5–15.5)
WBC: 7.3 10*3/uL (ref 4.0–10.5)
nRBC: 0 % (ref 0.0–0.2)

## 2018-12-05 LAB — GLUCOSE, CAPILLARY
Glucose-Capillary: 76 mg/dL (ref 70–99)
Glucose-Capillary: 101 mg/dL — ABNORMAL HIGH (ref 70–99)

## 2018-12-05 LAB — MAGNESIUM: Magnesium: 2 mg/dL (ref 1.7–2.4)

## 2018-12-05 MED ORDER — POTASSIUM CHLORIDE CRYS ER 20 MEQ PO TBCR: EXTENDED_RELEASE_TABLET | ORAL | 0 refills | Status: DC

## 2018-12-05 MED ORDER — CLINDAMYCIN HCL 300 MG PO CAPS
300.0000 mg | ORAL_CAPSULE | Freq: Three times a day (TID) | ORAL | Status: DC
  Administered 2018-12-05: 300 mg via ORAL
  Filled 2018-12-05 (×2): qty 1

## 2018-12-05 MED ORDER — CLINDAMYCIN HCL 300 MG PO CAPS: 300.0000 mg | ORAL_CAPSULE | Freq: Three times a day (TID) | ORAL | 0 refills | Status: AC

## 2018-12-05 NOTE — Plan of Care (Signed)
  Problem: Clinical Measurements: Goal: Ability to avoid or minimize complications of infection will improve 12/05/2018 1027 by Hubert Azure, RN Outcome: Adequate for Discharge 12/05/2018 0833 by Hubert Azure, RN Outcome: Progressing   Problem: Skin Integrity: Goal: Skin integrity will improve 12/05/2018 1027 by Hubert Azure, RN Outcome: Adequate for Discharge 12/05/2018 0833 by Hubert Azure, RN Outcome: Progressing   Problem: Education: Goal: Knowledge of General Education information will improve Description: Including pain rating scale, medication(s)/side effects and non-pharmacologic comfort measures 12/05/2018 1027 by Hubert Azure, RN Outcome: Adequate for Discharge 12/05/2018 0833 by Hubert Azure, RN Outcome: Progressing   Problem: Clinical Measurements: Goal: Ability to maintain clinical measurements within normal limits will improve 12/05/2018 1027 by Hubert Azure, RN Outcome: Adequate for Discharge 12/05/2018 0833 by Hubert Azure, RN Outcome: Progressing Goal: Will remain free from infection Outcome: Adequate for Discharge Goal: Diagnostic test results will improve Outcome: Adequate for Discharge Goal: Respiratory complications will improve Outcome: Adequate for Discharge Goal: Cardiovascular complication will be avoided Outcome: Adequate for Discharge   Problem: Activity: Goal: Risk for activity intolerance will decrease Outcome: Adequate for Discharge   Problem: Coping: Goal: Level of anxiety will decrease Outcome: Adequate for Discharge   Problem: Elimination: Goal: Will not experience complications related to bowel motility Outcome: Adequate for Discharge   Problem: Pain Managment: Goal: General experience of comfort will improve Outcome: Adequate for Discharge   Problem: Safety: Goal: Ability to remain free from injury will improve Outcome: Adequate for Discharge   Problem: Skin Integrity: Goal: Risk for impaired skin integrity  will decrease Outcome: Adequate for Discharge

## 2018-12-05 NOTE — Discharge Summary (Signed)
. Physician Discharge Summary  Madison Wells PPI:[id number] DOB: 15-May-1956 DOA: 12/03/2018  PCP: [name], FNP  Admit date: 12/03/2018 Discharge date: 12/05/2018  Admitted From: Home Disposition:  Discharged to home  Recommendations for Outpatient Follow-up:  1. Follow up with PCP in 5 - 7 days. 2. Please obtain BMP/CBC in 5 days.   Discharge Condition: Stable  CODE STATUS: FULL   Brief/Interim Summary: Madison Lasseigne McFaddenis a 63 y.o.femalewith medical history significant forhypertension, prediabetes, obesity, chronic pain syndrome, depression, presents to the ED complaining of left lower extremity swelling, warmth and erythema for the past few days. Patient had been prescribed Lasix for the swelling with no significant improvement. Patient reported some generalized cramping. Patient denies any fever/chills, chest pain, abdominal pain, nausea/vomiting/diarrhea, shortness of breath. Upon chart review, patient noted to have cellulitis of the same left lower extremity back in 08/2018 and was prescribed Keflex. Patient does not recall ever having cellulitis in April and given a new prescription, so unsure if patient took the medications back then.  Discharge Diagnoses:  Principal Problem:   Cellulitis of left lower leg Active Problems:   Obesity   Hypertension   Anxiety   Low back pain   Hypokalemia  Cellulitis of the left lower extremity     - Afebrile, no leukocytosis     - Lactic acid within normal limits     - Bld Cx pending     - Venous Doppler negative for DVT     - Continue IV vancomycin, ceftriaxone; will likely be able to transition to clinda at discharge     - her cellulitis is regressing and is less tender today; she says she feels well enough to try this at home; will switch her to PO clinda for an additional 5 days (total 7 days abx tx for SSTI); she will need to follow up with her PCP in 5 days  Hypokalemia     - Replete, monitor (will get IV and  PO today)     - improved, but not quite normal; continue PO K+ 49mEq BID for next couple days and then maintenance dose will be 86mEq daily  Hypertension     - BP ok; resume home lasix     - Hold home HCTZ  Prediabetes     - Hold home metformin for now     - SSI, Accu-Cheks, hypoglycemic protocol  Chronic pain syndrome     - Continue gabapentin, Flexeril, oxycodone  Depression     - Continue Paxil, Wellbutrin  Morbid obesity     - Lifestyle modification advised     - Continue phentermine  Tobacco abuse     - Advised to quit     - Nicotine patch ordered  Discharge Instructions   Allergies as of 12/05/2018   No Known Allergies     Medication List    TAKE these medications   alendronate 70 MG tablet Commonly known as: FOSAMAX TAKE 1 TABLET EVERY WEEK IN THE MORNING AT LEAST 30 MINS BEFORE FIRST FOOD/BEVERAGE/OR MEDS OF DAY What changed: See the new instructions.   aspirin EC 81 MG tablet Take 81 mg by mouth daily.   buPROPion 150 MG 24 hr tablet Commonly known as: WELLBUTRIN XL Take 150 mg by mouth daily.   clindamycin 300 MG capsule Commonly known as: CLEOCIN Take 1 capsule (300 mg total) by mouth every 8 (eight) hours for 5 days.   cyclobenzaprine 10 MG tablet Commonly known as: FLEXERIL Take 10 mg  by mouth 3 (three) times daily as needed for muscle spasms.   furosemide 40 MG tablet Commonly known as: LASIX Take 1 tablet (40 mg total) by mouth daily.   gabapentin 300 MG capsule Commonly known as: NEURONTIN TAKE 1 CAPSULE BY MOUTH THREE TIMES A DAY What changed: See the new instructions.   hydrochlorothiazide 12.5 MG tablet Commonly known as: HYDRODIURIL TAKE 1 TABLET BY MOUTH EVERY DAY   ibuprofen 800 MG tablet Commonly known as: ADVIL Take 800 mg by mouth 3 (three) times daily.   ketoconazole 2 % shampoo Commonly known as: NIZORAL Apply 1 application topically 2 (two) times a week.   metFORMIN 500 MG tablet Commonly known as:  GLUCOPHAGE TAKE 1 TABLET BY MOUTH TWICE A DAY WITH FOOD   mometasone 0.1 % cream Commonly known as: ELOCON APPLY TO AFFECTED AREA A THIN LAYER DAILY What changed: See the new instructions.   multivitamin with minerals tablet Take 1 tablet by mouth daily.   oxybutynin 5 MG tablet Commonly known as: DITROPAN Take 1 tablet (5 mg total) by mouth daily.   oxyCODONE HCl 15 MG Taba Take 10 mg by mouth 4 (four) times daily.   PARoxetine 10 MG tablet Commonly known as: PAXIL TAKE 1 TABLET EVERY DAY   phentermine 15 MG capsule Take 15 mg by mouth daily.   potassium chloride SA 20 MEQ tablet Commonly known as: K-DUR Take 2 tablets (40 mEq total) by mouth 2 (two) times daily for 2 days, THEN 2 tablets (40 mEq total) daily. Start taking on: December 05, 2018 What changed: See the new instructions.   Suvorexant 15 MG Tabs Commonly known as: Belsomra Take 1 tablet by mouth at bedtime as needed. What changed: reasons to take this   Vitamin D (Ergocalciferol) 1.25 MG (50000 UT) Caps capsule Commonly known as: DRISDOL Take 50,000 Units by mouth every 7 (seven) days.       No Known Allergies  Consultations:  None   Procedures/Studies: Vas [place] Lower Extremity Venous (dvt)  Result Date: 12/04/2018  Lower Venous Study Indications: Cellulitis.  Limitations: Body habitus. Comparison Study: 12/02/17 negative Performing Technologist: June Leap RDMS, RVT  Examination Guidelines: A complete evaluation includes B-mode imaging, spectral Doppler, color Doppler, and power Doppler as needed of all accessible portions of each vessel. Bilateral testing is considered an integral part of a complete examination. Limited examinations for reoccurring indications may be performed as noted.  +---------+---------------+---------+-----------+----------+-------+ LEFT     CompressibilityPhasicitySpontaneityPropertiesSummary +---------+---------------+---------+-----------+----------+-------+ CFV       Full           Yes      Yes                          +---------+---------------+---------+-----------+----------+-------+ SFJ      Full                                                 +---------+---------------+---------+-----------+----------+-------+ FV Prox  Full                                                 +---------+---------------+---------+-----------+----------+-------+ FV Mid   Full                                                 +---------+---------------+---------+-----------+----------+-------+  FV DistalFull                                                 +---------+---------------+---------+-----------+----------+-------+ PFV      Full                                                 +---------+---------------+---------+-----------+----------+-------+ POP      Full           Yes      Yes                          +---------+---------------+---------+-----------+----------+-------+ PTV      Full                                                 +---------+---------------+---------+-----------+----------+-------+ PERO     Full                                                 +---------+---------------+---------+-----------+----------+-------+     Summary: Left: There is no evidence of deep vein thrombosis in the lower extremity. No cystic structure found in the popliteal fossa. Ultrasound characteristics of enlarged lymph nodes noted in the groin.  *See table(s) above for measurements and observations. Electronically signed by Monica [place] MD on 12/04/2018 at 5:08:44 PM.    Final     (Echo, Carotid, EGD, Colonoscopy, ERCP)    Subjective: "I'm feeling MUCH better."  Discharge Exam: Vitals:   12/04/18 2202 12/05/18 0546  BP: 116/60 129/79  Pulse: 66 64  Resp: 18 17  Temp: 97.9 F (36.6 C) 97.6 F (36.4 C)  SpO2: 98% 96%   Vitals:   12/04/18 1407 12/04/18 2055 12/04/18 2202 12/05/18 0546  BP: (!) 155/71  116/60 129/79   Pulse: 70 72 66 64  Resp: 16 16 18 17   Temp: (!) 97.4 F (36.3 C)  97.9 F (36.6 C) 97.6 F (36.4 C)  TempSrc: Oral  Oral Oral  SpO2: 97%  98% 96%  Weight:      Height:        General exam: 63 y.o. female Appears calm and comfortable  Respiratory system: Clear to auscultation. Respiratory effort normal. Cardiovascular system: S1 & S2 heard, RRR. No JVD, murmurs, rubs, gallops or clicks. No pedal edema. Gastrointestinal system: Abdomen is nondistended, soft and nontender. No organomegaly or masses felt. Normal bowel sounds heard, morbidly obese Central nervous system: Alert and oriented. No focal neurological deficits. Extremities: Symmetric 5 x 5 power. Skin: Erythema and edema of LLE extremity extending shin through toes, less erythema and tenderness today, borders have regressed to just above mid shin Psychiatry: Judgement and insight appear normal. Mood & affect appropriate.     The results of significant diagnostics from this hospitalization (including imaging, microbiology, ancillary and laboratory) are listed below for reference.     Microbiology: Recent Results (from the past 240 hour(s))  Culture, blood (Routine X  2) w Reflex to ID Panel     Status: None (Preliminary result)   Collection Time: 12/03/18  2:30 PM   Specimen: BLOOD  Result Value Ref Range Status   Specimen Description   Final    BLOOD LEFT ANTECUBITAL Performed at [facility] [address], [place], [place] [address]    Special Requests   Final    BOTTLES DRAWN AEROBIC AND ANAEROBIC Blood Culture adequate volume Performed at [facility] [address] [place], [place] [address]    Culture   Final    NO GROWTH < 24 HOURS Performed at [facility] [address] [place], [place] [address]    Report Status PENDING  Incomplete  Culture, blood (Routine X 2) w Reflex to ID Panel     Status: None (Preliminary result)   Collection Time: 12/03/18  2:30 PM    Specimen: BLOOD  Result Value Ref Range Status   Specimen Description   Final    BLOOD RIGHT ANTECUBITAL Performed at [facility] [address], [place], [place] [address]    Special Requests   Final    BOTTLES DRAWN AEROBIC AND ANAEROBIC Blood Culture adequate volume Performed at [facility] [address], [place], [place] [address]    Culture   Final    NO GROWTH < 24 HOURS Performed at [facility] [address] [place], [place] [address]    Report Status PENDING  Incomplete  SARS Coronavirus 2 (CEPHEID - Performed in [facility] hospital lab), Hosp Order     Status: None   Collection Time: 12/03/18  4:33 PM   Specimen: Nasopharyngeal Swab  Result Value Ref Range Status   SARS Coronavirus 2 NEGATIVE NEGATIVE Final    Comment: (NOTE) If result is NEGATIVE SARS-CoV-2 target nucleic acids are NOT DETECTED. The SARS-CoV-2 RNA is generally detectable in upper and lower  respiratory specimens during the acute phase of infection. The lowest  concentration of SARS-CoV-2 viral copies this assay can detect is 250  copies / mL. A negative result does not preclude SARS-CoV-2 infection  and should not be used as the sole basis for treatment or other  patient management decisions.  A negative result may occur with  improper specimen collection / handling, submission of specimen other  than nasopharyngeal swab, presence of viral mutation(s) within the  areas targeted by this assay, and inadequate number of viral copies  (<250 copies / mL). A negative result must be combined with clinical  observations, patient history, and epidemiological information. If result is POSITIVE SARS-CoV-2 target nucleic acids are DETECTED. The SARS-CoV-2 RNA is generally detectable in upper and lower  respiratory specimens dur ing the acute phase of infection.  Positive  results are indicative of active infection with SARS-CoV-2.  Clinical  correlation  with patient history and other diagnostic information is  necessary to determine patient infection status.  Positive results do  not rule out bacterial infection or co-infection with other viruses. If result is PRESUMPTIVE POSTIVE SARS-CoV-2 nucleic acids MAY BE PRESENT.   A presumptive positive result was obtained on the submitted specimen  and confirmed on repeat testing.  While 2019 novel coronavirus  (SARS-CoV-2) nucleic acids may be present in the submitted sample  additional confirmatory testing may be necessary for epidemiological  and / or clinical management purposes  to differentiate between  SARS-CoV-2 and other Sarbecovirus currently known to infect humans.  If clinically indicated  additional testing with an alternate test  methodology [phone]) is advised. The SARS-CoV-2 RNA is generally  detectable in upper and lower respiratory sp ecimens during the acute  phase of infection. The expected result is Negative. Fact Sheet for Patients:  StrictlyIdeas.no Fact Sheet for Healthcare Providers: BankingDealers.co.za This test is not yet approved or cleared by the [place] FDA and has been authorized for detection and/or diagnosis of SARS-CoV-2 by FDA under an Emergency Use Authorization (EUA).  This EUA will remain in effect (meaning this test can be used) for the duration of the COVID-19 declaration under Section 564(b)(1) of the Act, 21 U.S.C. section 360bbb-3(b)(1), unless the authorization is terminated or revoked sooner. Performed at [facility], [place] [address] [place], [place] [address]      Labs: BNP (last 3 results) No results for input(s): BNP in the last 8760 hours. Basic Metabolic Panel: Recent Labs  Lab 12/03/18 1429 12/04/18 0328 12/05/18 0259  NA 137 141 140  K 2.3* 2.8* 3.3*  CL 92* 100 103  CO2 31 29 25   GLUCOSE 119* 124* 106*  BUN 16 13 9   CREATININE 0.86 0.80 0.72  CALCIUM  11.1* 10.8* 9.8  MG 2.1  --  2.0  PHOS  --   --  3.7   Liver Function Tests: Recent Labs  Lab 12/03/18 1429 12/05/18 0259  AST 20  --   ALT 17  --   ALKPHOS 92  --   BILITOT 0.6  --   PROT 7.7  --   ALBUMIN 4.0 3.1*   No results for input(s): LIPASE, AMYLASE in the last 168 hours. No results for input(s): AMMONIA in the last 168 hours. CBC: Recent Labs  Lab 12/03/18 1429 12/04/18 0328 12/05/18 0259  WBC 8.3 6.9 7.3  NEUTROABS 5.8  --  3.8  HGB 13.6 12.9 12.6  HCT 41.3 39.9 38.6  MCV 92.8 94.3 94.8  PLT 212 198 225   Cardiac Enzymes: No results for input(s): CKTOTAL, CKMB, CKMBINDEX, TROPONINI in the last 168 hours. BNP: Invalid input(s): POCBNP CBG: Recent Labs  Lab 12/04/18 0723 12/04/18 1236 12/04/18 1610 12/04/18 2206 12/05/18 0725  GLUCAP 131* 134* 132* 76 101*   D-Dimer No results for input(s): DDIMER in the last 72 hours. Hgb A1c Recent Labs    12/04/18 0328  HGBA1C 6.0*   Lipid Profile No results for input(s): CHOL, HDL, LDLCALC, TRIG, CHOLHDL, LDLDIRECT in the last 72 hours. Thyroid function studies No results for input(s): TSH, T4TOTAL, T3FREE, THYROIDAB in the last 72 hours.  Invalid input(s): FREET3 Anemia work up No results for input(s): VITAMINB12, FOLATE, FERRITIN, TIBC, IRON, RETICCTPCT in the last 72 hours. Urinalysis    Component Value Date/Time   COLORURINE STRAW (A) 12/03/2018 1347   APPEARANCEUR CLEAR 12/03/2018 1347   LABSPEC 1.005 12/03/2018 1347   PHURINE 8.0 12/03/2018 1347   GLUCOSEU NEGATIVE 12/03/2018 1347   HGBUR NEGATIVE 12/03/2018 1347   BILIRUBINUR NEGATIVE 12/03/2018 1347   BILIRUBINUR negative 03/23/2018 1405   KETONESUR NEGATIVE 12/03/2018 1347   PROTEINUR NEGATIVE 12/03/2018 1347   UROBILINOGEN 0.2 03/23/2018 1405   UROBILINOGEN 0.2 07/03/2007 1114   NITRITE NEGATIVE 12/03/2018 1347   LEUKOCYTESUR TRACE (A) 12/03/2018 1347   Sepsis Labs Invalid input(s): PROCALCITONIN,  WBC,   LACTICIDVEN Microbiology Recent Results (from the past 240 hour(s))  Culture, blood (Routine X 2) w Reflex to ID Panel     Status: None (Preliminary result)   Collection Time: 12/03/18  2:30 PM  Specimen: BLOOD  Result Value Ref Range Status   Specimen Description   Final    BLOOD LEFT ANTECUBITAL Performed at [facility] [address] [place], [place] [address]    Special Requests   Final    BOTTLES DRAWN AEROBIC AND ANAEROBIC Blood Culture adequate volume Performed at [facility] [address] [place], [place] [address]    Culture   Final    NO GROWTH < 24 HOURS Performed at [facility] [address], [place], [place] [address]    Report Status PENDING  Incomplete  Culture, blood (Routine X 2) w Reflex to ID Panel     Status: None (Preliminary result)   Collection Time: 12/03/18  2:30 PM   Specimen: BLOOD  Result Value Ref Range Status   Specimen Description   Final    BLOOD RIGHT ANTECUBITAL Performed at [facility] [address], [place], [place] [address]    Special Requests   Final    BOTTLES DRAWN AEROBIC AND ANAEROBIC Blood Culture adequate volume Performed at [facility] [address], [place], [place] [address]    Culture   Final    NO GROWTH < 24 HOURS Performed at [facility] [address] [place], [place] [address]    Report Status PENDING  Incomplete  SARS Coronavirus 2 (CEPHEID - Performed in [facility] hospital lab), Hosp Order     Status: None   Collection Time: 12/03/18  4:33 PM   Specimen: Nasopharyngeal Swab  Result Value Ref Range Status   SARS Coronavirus 2 NEGATIVE NEGATIVE Final    Comment: (NOTE) If result is NEGATIVE SARS-CoV-2 target nucleic acids are NOT DETECTED. The SARS-CoV-2 RNA is generally detectable in upper and lower  respiratory specimens during the acute phase of infection. The lowest  concentration of SARS-CoV-2 viral  copies this assay can detect is 250  copies / mL. A negative result does not preclude SARS-CoV-2 infection  and should not be used as the sole basis for treatment or other  patient management decisions.  A negative result may occur with  improper specimen collection / handling, submission of specimen other  than nasopharyngeal swab, presence of viral mutation(s) within the  areas targeted by this assay, and inadequate number of viral copies  (<250 copies / mL). A negative result must be combined with clinical  observations, patient history, and epidemiological information. If result is POSITIVE SARS-CoV-2 target nucleic acids are DETECTED. The SARS-CoV-2 RNA is generally detectable in upper and lower  respiratory specimens dur ing the acute phase of infection.  Positive  results are indicative of active infection with SARS-CoV-2.  Clinical  correlation with patient history and other diagnostic information is  necessary to determine patient infection status.  Positive results do  not rule out bacterial infection or co-infection with other viruses. If result is PRESUMPTIVE POSTIVE SARS-CoV-2 nucleic acids MAY BE PRESENT.   A presumptive positive result was obtained on the submitted specimen  and confirmed on repeat testing.  While 2019 novel coronavirus  (SARS-CoV-2) nucleic acids may be present in the submitted sample  additional confirmatory testing may be necessary for epidemiological  and / or clinical management purposes  to differentiate between  SARS-CoV-2 and other Sarbecovirus currently known to infect humans.  If clinically indicated additional testing with an alternate test  methodology [phone]) is advised. The SARS-CoV-2 RNA is generally  detectable in upper and lower respiratory sp ecimens  during the acute  phase of infection. The expected result is Negative. Fact Sheet for Patients:  StrictlyIdeas.no Fact Sheet for Healthcare  Providers: BankingDealers.co.za This test is not yet approved or cleared by the [place] FDA and has been authorized for detection and/or diagnosis of SARS-CoV-2 by FDA under an Emergency Use Authorization (EUA).  This EUA will remain in effect (meaning this test can be used) for the duration of the COVID-19 declaration under Section 564(b)(1) of the Act, 21 U.S.C. section 360bbb-3(b)(1), unless the authorization is terminated or revoked sooner. Performed at [facility], [place] [address] [place], [place] [address]      Time coordinating discharge: 35 minutes  SIGNED:   Jonnie Finner, DO  [facility] 12/05/2018, 9:08 AM Pager   If 7PM-7AM, please contact night-coverage [email or web address] Password [email or web address]

## 2018-12-05 NOTE — Plan of Care (Signed)
  Problem: Clinical Measurements: Goal: Ability to avoid or minimize complications of infection will improve Outcome: Progressing   Problem: Skin Integrity: Goal: Skin integrity will improve Outcome: Progressing   Problem: Education: Goal: Knowledge of General Education information will improve Description: Including pain rating scale, medication(s)/side effects and non-pharmacologic comfort measures Outcome: Progressing   Problem: Clinical Measurements: Goal: Ability to maintain clinical measurements within normal limits will improve Outcome: Progressing

## 2018-12-05 NOTE — Plan of Care (Signed)
Plan of care reviewed and discussed with the patient. 

## 2018-12-06 ENCOUNTER — Telehealth: Payer: Self-pay

## 2018-12-06 NOTE — Telephone Encounter (Signed)
Transition Care Management Follow-up Telephone Call  Date of discharge and from where: 12/05/2018 from [facility]  How have you been since you were released from the hospital? better  Any questions or concerns? No   Items Reviewed:  Did the pt receive and understand the discharge instructions provided? Yes   Medications obtained and verified? No   Any new allergies since your discharge? Yes   Dietary orders reviewed? No  Do you have support at home? Yes   Other (ie: DME, Home Health, etc) no home health  Functional Questionnaire: (I = Independent and D = Dependent) ADL's: I  Bathing/Dressing- I   Meal Prep- I  Eating- I  Maintaining continence- I  Transferring/Ambulation- I  Managing Meds- I   Follow up appointments reviewed:    PCP Hospital f/u appt confirmed? Yes  Scheduled to see [name] on 12/11/2018 @ 12:00.  [facility] f/u appt confirmed? No  Scheduled to see  on  @ .  Are transportation arrangements needed? No   If their condition worsens, is the pt aware to call  their PCP or go to the ED? Yes  Was the patient provided with contact information for the PCP's office or ED? Yes  Was the pt encouraged to call back with questions or concerns? Yes

## 2018-12-08 LAB — CULTURE, BLOOD (ROUTINE X 2)
Culture: NO GROWTH
Culture: NO GROWTH
Special Requests: ADEQUATE
Special Requests: ADEQUATE

## 2018-12-11 ENCOUNTER — Other Ambulatory Visit: Payer: Self-pay | Admitting: Nurse Practitioner

## 2018-12-11 ENCOUNTER — Other Ambulatory Visit: Payer: Self-pay

## 2018-12-11 ENCOUNTER — Encounter: Payer: Self-pay | Admitting: Nurse Practitioner

## 2018-12-11 ENCOUNTER — Ambulatory Visit (INDEPENDENT_AMBULATORY_CARE_PROVIDER_SITE_OTHER): Payer: Medicaid Other | Admitting: Nurse Practitioner

## 2018-12-11 VITALS — Wt 262.0 lb

## 2018-12-11 DIAGNOSIS — Z09 Encounter for follow-up examination after completed treatment for conditions other than malignant neoplasm: Secondary | ICD-10-CM

## 2018-12-11 DIAGNOSIS — L03116 Cellulitis of left lower limb: Secondary | ICD-10-CM | POA: Diagnosis not present

## 2018-12-11 DIAGNOSIS — E876 Hypokalemia: Secondary | ICD-10-CM | POA: Diagnosis not present

## 2018-12-11 DIAGNOSIS — R600 Localized edema: Secondary | ICD-10-CM | POA: Diagnosis not present

## 2018-12-11 MED ORDER — SPIRONOLACTONE 25 MG PO TABS: 25.0000 mg | ORAL_TABLET | Freq: Every day | ORAL | 2 refills | Status: DC

## 2018-12-11 NOTE — Progress Notes (Signed)
Subjective:     Patient ID: Madison Wells , female    DOB: [birth year] , 63 y.o.   MRN: [id number]  I connected with  Madison Wells on 12/18/18 by a video enabled telemedicine application and verified that I am speaking with the correct person using two identifiers.   I discussed the limitations of evaluation and management by telemedicine. The patient expressed understanding and agreed to proceed.   Chief Complaint  Patient presents with  . Hospitalization Follow-up    HPI Video visit: spoke with Madison Wells, she is at home  Hospital follow up right lower extremity she is on antibiotics clindamycin.  Redness and numbness to toes.  Pain upper right arm area.  Her potassium was at 2.3 even with oral potassium.  Increased to 40 meq, for two days was taking 4 pills then back down to 40 meq.  Friday before going to ER unable to turn her head but her arms were hurting.  She has completed the antibiotics.  Trying to keep her legs elevated.  Limited amount of bread.  Vegetables and meat mostly.      Past Medical History:  Diagnosis Date  . Angina   . Anxiety   . Bipolar 1 disorder ([place])   . Cervicalgia   . Chronic pain syndrome   . Diabetes mellitus without complication ([place])   . Disturbance of skin sensation   . Dizziness and giddiness   . Enthesopathy of hip region   . Headache   . Hypertension   . Lumbago   . Obesity   . Pain in joint, hand      Family History  Problem Relation Age of Onset  . Alcohol abuse Mother   . Alcohol abuse Father   . Alcohol abuse Sister   . Alcohol abuse Brother      Current Outpatient Medications:  .  alendronate (FOSAMAX) 70 MG tablet, TAKE 1 TABLET EVERY WEEK IN THE MORNING AT LEAST 30 MINS BEFORE FIRST FOOD/BEVERAGE/OR MEDS OF DAY (Patient taking differently: Take 70 mg by mouth once a week. ), Disp: 4 tablet, Rfl: 1 .  aspirin EC 81 MG tablet, Take 81 mg by mouth daily., Disp: , Rfl:  .  buPROPion (WELLBUTRIN XL) 150 MG  24 hr tablet, Take 150 mg by mouth daily., Disp: , Rfl:  .  Calcium Carbonate-Vitamin D (CALCIUM 500 + D) 500-125 MG-UNIT TABS, Take 1 tablet by mouth daily at 12 noon., Disp: , Rfl:  .  cyclobenzaprine (FLEXERIL) 10 MG tablet, Take 10 mg by mouth 3 (three) times daily as needed for muscle spasms., Disp: , Rfl:  .  furosemide (LASIX) 40 MG tablet, Take 1 tablet (40 mg total) by mouth daily., Disp: 90 tablet, Rfl: 0 .  gabapentin (NEURONTIN) 300 MG capsule, TAKE 1 CAPSULE BY MOUTH THREE TIMES A DAY (Patient taking differently: Take 300 mg by mouth 3 (three) times daily. ), Disp: 90 capsule, Rfl: 1 .  hydrochlorothiazide (HYDRODIURIL) 12.5 MG tablet, TAKE 1 TABLET BY MOUTH EVERY DAY (Patient taking differently: Take 12.5 mg by mouth daily. ), Disp: 90 tablet, Rfl: 1 .  ibuprofen (ADVIL,MOTRIN) 800 MG tablet, Take 800 mg by mouth 3 (three) times daily., Disp: , Rfl:  .  ketoconazole (NIZORAL) 2 % shampoo, Apply 1 application topically 2 (two) times a week. , Disp: , Rfl: 1 .  metFORMIN (GLUCOPHAGE) 500 MG tablet, TAKE 1 TABLET BY MOUTH TWICE A DAY WITH FOOD (Patient taking differently: Take 500 mg by  mouth 2 (two) times daily with a meal. ), Disp: 90 tablet, Rfl: 1 .  mometasone (ELOCON) 0.1 % cream, APPLY TO AFFECTED AREA A THIN LAYER DAILY (Patient taking differently: Apply 1 application topically daily. ), Disp: 60 g, Rfl: 2 .  Multiple Vitamins-Minerals (MULTIVITAMIN WITH MINERALS) tablet, Take 1 tablet by mouth daily., Disp: , Rfl:  .  oxybutynin (DITROPAN) 5 MG tablet, Take 1 tablet (5 mg total) by mouth daily., Disp: 90 tablet, Rfl: 0 .  oxyCODONE HCl 15 MG TABA, Take 10 mg by mouth 4 (four) times daily. , Disp: , Rfl:  .  PARoxetine (PAXIL) 10 MG tablet, TAKE 1 TABLET EVERY DAY (Patient taking differently: Take 10 mg by mouth daily. ), Disp: 90 tablet, Rfl: 1 .  phentermine 15 MG capsule, Take 15 mg by mouth daily., Disp: , Rfl:  .  potassium chloride SA (K-DUR) 20 MEQ tablet, Take 2 tablets (40  mEq total) by mouth 2 (two) times daily for 2 days, THEN 2 tablets (40 mEq total) daily., Disp: 68 tablet, Rfl: 0 .  Suvorexant (BELSOMRA) 15 MG TABS, Take 1 tablet by mouth at bedtime as needed. (Patient taking differently: Take 1 tablet by mouth at bedtime as needed (sleep). ), Disp: 30 tablet, Rfl: 3 .  Vitamin D, Ergocalciferol, (DRISDOL) 50000 units CAPS capsule, Take 50,000 Units by mouth every 7 (seven) days., Disp: , Rfl:    No Known Allergies   Review of Systems  Constitutional: Negative.  Negative for fatigue and fever.  Respiratory: Negative.   Cardiovascular: Negative.   Musculoskeletal:       Left lower extremity swelling and redness  Neurological: Negative for dizziness and headaches.  Psychiatric/Behavioral: Negative.      Today's Vitals   12/11/18 1206  Weight: 262 lb (118.8 kg)  PainSc: 0-No pain   Body mass index is 44.97 kg/m.   Objective:  Physical Exam Constitutional:      Appearance: Normal appearance.  Cardiovascular:     Rate and Rhythm: Normal rate and regular rhythm.     Pulses: Normal pulses.     Heart sounds: Normal heart sounds. No murmur.  Pulmonary:     Effort: Pulmonary effort is normal.     Breath sounds: Normal breath sounds.  Skin:    Capillary Refill: Capillary refill takes less than 2 seconds.  Neurological:     General: No focal deficit present.     Mental Status: She is alert and oriented to person, place, and time.  Psychiatric:        Mood and Affect: Mood normal.        Behavior: Behavior normal.        Thought Content: Thought content normal.        Judgment: Judgment normal.         Assessment And Plan:     1. Lower extremity edema  Admitted to hospital on 7/12-7/14 for left lower extremity swelling and redness.  She feels is somewhat better but starting to swell again  She was on lasix when discharged however I am changing to spironolactone due to frequent hypokalemia episodes including this admission TCM Performed.  A member of the clinical team spoke with the patient upon dischare. Discharge summary was reviewed in full detail during the visit. Meds reconciled and compared to discharge meds. Medication list is updated and reviewed with the patient.  Greater than 50% face to face time was spent in counseling an coordination of care.  All questions were  answered to the satisfaction of the patient.   - spironolactone (ALDACTONE) 25 MG tablet; Take 1 tablet (25 mg total) by mouth daily.  Dispense: 30 tablet; Refill: 2  2. Cellulitis of left lower extremity  She has taken Clindamycin and IV antibiotics  3. Hypokalemia  She was given IV potassium and increase in oral supplementation  We will check her kidney functions next year  We will likely need to decrease this dose with the spironolactone.   [name], FNP    THE PATIENT IS ENCOURAGED TO PRACTICE SOCIAL DISTANCING DUE TO THE COVID-19 PANDEMIC.

## 2018-12-13 ENCOUNTER — Encounter: Payer: Self-pay | Admitting: Nurse Practitioner

## 2018-12-14 ENCOUNTER — Other Ambulatory Visit: Payer: Self-pay | Admitting: Nurse Practitioner

## 2018-12-14 NOTE — Telephone Encounter (Signed)
Call patient to see if she is using a cool compress to her leg and see if any better tomorrow. See how often she is elevated her left lower extremity.  We may have to treat with another antibiotic and possibly refer her to the lymphedema clinic to see if she needs improved circulation to the lymph nodes.

## 2018-12-15 ENCOUNTER — Telehealth: Payer: Self-pay

## 2018-12-15 NOTE — Telephone Encounter (Signed)
I called pt and she stated her leg is still swollen it is ok for right now but she knows it is going to worsen because she is going out of town for a funeral. She also stated she elevates her leg all day except for when she has to cook dinner. I told pt if you see this message before the end of today I will call her back to see where she would like her antibiotic sent to and if not I will call her on Monday and let her know she stated it was ok for me to leave her a v/m. YRL,RMA

## 2018-12-18 ENCOUNTER — Other Ambulatory Visit: Payer: Self-pay

## 2018-12-18 ENCOUNTER — Other Ambulatory Visit: Payer: Medicaid Other

## 2018-12-18 DIAGNOSIS — I10 Essential (primary) hypertension: Secondary | ICD-10-CM

## 2018-12-18 DIAGNOSIS — R600 Localized edema: Secondary | ICD-10-CM | POA: Insufficient documentation

## 2018-12-19 LAB — BMP8+EGFR
BUN/Creatinine Ratio: 7 — ABNORMAL LOW (ref 12–28)
BUN: 7 mg/dL — ABNORMAL LOW (ref 8–27)
CO2: 24 mmol/L (ref 20–29)
Calcium: 10.2 mg/dL (ref 8.7–10.3)
Chloride: 99 mmol/L (ref 96–106)
Creatinine, Ser: 1.05 mg/dL — ABNORMAL HIGH (ref 0.57–1.00)
GFR calc Af Amer: 66 mL/min/{1.73_m2} (ref >59)
GFR calc non Af Amer: 57 mL/min/{1.73_m2} — ABNORMAL LOW (ref >59)
Glucose: 111 mg/dL — ABNORMAL HIGH (ref 65–99)
Potassium: 4.5 mmol/L (ref 3.5–5.2)
Sodium: 140 mmol/L (ref 134–144)

## 2018-12-20 ENCOUNTER — Encounter: Payer: Self-pay | Admitting: Nurse Practitioner

## 2018-12-26 ENCOUNTER — Other Ambulatory Visit: Payer: Self-pay | Admitting: Nurse Practitioner

## 2018-12-26 MED ORDER — OXYBUTYNIN CHLORIDE 5 MG PO TABS: 5.0000 mg | ORAL_TABLET | Freq: Every day | ORAL | 1 refills | Status: DC

## 2018-12-27 ENCOUNTER — Other Ambulatory Visit: Payer: Self-pay | Admitting: Nurse Practitioner

## 2018-12-28 ENCOUNTER — Other Ambulatory Visit: Payer: Self-pay | Admitting: Nurse Practitioner

## 2018-12-28 ENCOUNTER — Encounter: Payer: Self-pay | Admitting: Nurse Practitioner

## 2018-12-28 DIAGNOSIS — G4700 Insomnia, unspecified: Secondary | ICD-10-CM

## 2018-12-28 MED ORDER — BELSOMRA 15 MG PO TABS: 1.0000 | ORAL_TABLET | Freq: Every evening | ORAL | 5 refills | Status: DC | PRN

## 2018-12-31 ENCOUNTER — Encounter: Payer: Self-pay | Admitting: Nurse Practitioner

## 2019-01-08 ENCOUNTER — Other Ambulatory Visit: Payer: Self-pay | Admitting: Nurse Practitioner

## 2019-01-08 DIAGNOSIS — R600 Localized edema: Secondary | ICD-10-CM

## 2019-01-08 NOTE — Telephone Encounter (Signed)
Gabapentin refill

## 2019-01-11 ENCOUNTER — Encounter: Payer: Self-pay | Admitting: Nurse Practitioner

## 2019-01-12 ENCOUNTER — Other Ambulatory Visit: Payer: Self-pay

## 2019-01-12 MED ORDER — POTASSIUM CHLORIDE CRYS ER 20 MEQ PO TBCR: EXTENDED_RELEASE_TABLET | ORAL | 0 refills | Status: DC

## 2019-01-24 ENCOUNTER — Other Ambulatory Visit: Payer: Self-pay

## 2019-01-24 ENCOUNTER — Telehealth: Payer: Self-pay

## 2019-01-24 MED ORDER — ATORVASTATIN CALCIUM 10 MG PO TABS: 10.0000 mg | ORAL_TABLET | Freq: Every day | ORAL | 2 refills | Status: DC

## 2019-01-24 NOTE — Telephone Encounter (Signed)
I called patient to see if she has ever taken a statin medication before she stated she has not I told pt that it is recommended that she take a statin medication due to her having hypertension and diabetes to reduce her risk for cardiac disease. She was ok with this so I sent in Atorvastatin 10mg . Lonia Mad

## 2019-01-24 NOTE — Progress Notes (Signed)
ator

## 2019-02-22 ENCOUNTER — Other Ambulatory Visit: Payer: Self-pay

## 2019-02-22 ENCOUNTER — Other Ambulatory Visit: Payer: Self-pay | Admitting: Nurse Practitioner

## 2019-02-22 ENCOUNTER — Encounter: Payer: Self-pay | Admitting: Nurse Practitioner

## 2019-02-22 MED ORDER — CYCLOBENZAPRINE HCL 10 MG PO TABS: 10.0000 mg | ORAL_TABLET | Freq: Three times a day (TID) | ORAL | 1 refills | Status: AC | PRN

## 2019-03-01 ENCOUNTER — Encounter: Payer: Self-pay | Admitting: Nurse Practitioner

## 2019-03-01 ENCOUNTER — Other Ambulatory Visit: Payer: Self-pay | Admitting: Nurse Practitioner

## 2019-03-01 DIAGNOSIS — R600 Localized edema: Secondary | ICD-10-CM

## 2019-03-05 ENCOUNTER — Other Ambulatory Visit: Payer: Self-pay

## 2019-03-05 ENCOUNTER — Encounter: Payer: Self-pay | Admitting: Nurse Practitioner

## 2019-03-05 MED ORDER — METFORMIN HCL 500 MG PO TABS: 500.0000 mg | ORAL_TABLET | Freq: Two times a day (BID) | ORAL | 1 refills | Status: DC

## 2019-03-09 ENCOUNTER — Encounter: Payer: Self-pay | Admitting: Nurse Practitioner

## 2019-03-23 ENCOUNTER — Other Ambulatory Visit: Payer: Self-pay | Admitting: Nurse Practitioner

## 2019-03-23 ENCOUNTER — Other Ambulatory Visit: Payer: Self-pay

## 2019-03-23 ENCOUNTER — Encounter: Payer: Self-pay | Admitting: Nurse Practitioner

## 2019-03-26 ENCOUNTER — Other Ambulatory Visit: Payer: Self-pay

## 2019-03-26 ENCOUNTER — Ambulatory Visit: Payer: Medicaid Other | Admitting: Nurse Practitioner

## 2019-03-26 ENCOUNTER — Encounter: Payer: Self-pay | Admitting: Nurse Practitioner

## 2019-03-26 VITALS — BP 120/78 | HR 84 | Temp 98.4°F | Ht 64.6 in | Wt 259.6 lb

## 2019-03-26 DIAGNOSIS — Z0000 Encounter for general adult medical examination without abnormal findings: Secondary | ICD-10-CM

## 2019-03-26 DIAGNOSIS — Z23 Encounter for immunization: Secondary | ICD-10-CM

## 2019-03-26 DIAGNOSIS — Z1211 Encounter for screening for malignant neoplasm of colon: Secondary | ICD-10-CM

## 2019-03-26 DIAGNOSIS — I10 Essential (primary) hypertension: Secondary | ICD-10-CM | POA: Diagnosis not present

## 2019-03-26 DIAGNOSIS — Z1159 Encounter for screening for other viral diseases: Secondary | ICD-10-CM | POA: Diagnosis not present

## 2019-03-26 DIAGNOSIS — E782 Mixed hyperlipidemia: Secondary | ICD-10-CM

## 2019-03-26 DIAGNOSIS — R195 Other fecal abnormalities: Secondary | ICD-10-CM

## 2019-03-26 DIAGNOSIS — Z1231 Encounter for screening mammogram for malignant neoplasm of breast: Secondary | ICD-10-CM

## 2019-03-26 DIAGNOSIS — G4700 Insomnia, unspecified: Secondary | ICD-10-CM

## 2019-03-26 DIAGNOSIS — E559 Vitamin D deficiency, unspecified: Secondary | ICD-10-CM

## 2019-03-26 LAB — POCT UA - MICROALBUMIN
Albumin/Creatinine Ratio, Urine, POC: 300
Creatinine, POC: 50 mg/dL
Microalbumin Ur, POC: 10 mg/L

## 2019-03-26 LAB — POCT URINALYSIS DIPSTICK
Bilirubin, UA: NEGATIVE
Glucose, UA: NEGATIVE
Ketones, UA: NEGATIVE
Nitrite, UA: NEGATIVE
Protein, UA: NEGATIVE
Spec Grav, UA: 1.025 (ref 1.010–1.025)
Urobilinogen, UA: 0.2 E.U./dL
pH, UA: 7.5 (ref 5.0–8.0)

## 2019-03-26 MED ORDER — BELSOMRA 15 MG PO TABS: 1.0000 | ORAL_TABLET | Freq: Every evening | ORAL | 5 refills | Status: DC | PRN

## 2019-03-26 NOTE — Patient Instructions (Signed)
Health Maintenance, Female Adopting a healthy lifestyle and getting preventive care are important in promoting health and wellness. Ask your health care provider about:  The right schedule for you to have regular tests and exams.  Things you can do on your own to prevent diseases and keep yourself healthy. What should I know about diet, weight, and exercise? Eat a healthy diet   Eat a diet that includes plenty of vegetables, fruits, low-fat dairy products, and lean protein.  Do not eat a lot of foods that are high in solid fats, added sugars, or sodium. Maintain a healthy weight Body mass index (BMI) is used to identify weight problems. It estimates body fat based on height and weight. Your health care provider can help determine your BMI and help you achieve or maintain a healthy weight. Get regular exercise Get regular exercise. This is one of the most important things you can do for your health. Most adults should:  Exercise for at least 150 minutes each week. The exercise should increase your heart rate and make you sweat (moderate-intensity exercise).  Do strengthening exercises at least twice a week. This is in addition to the moderate-intensity exercise.  Spend less time sitting. Even light physical activity can be beneficial. Watch cholesterol and blood lipids Have your blood tested for lipids and cholesterol at 63 years of age, then have this test every 5 years. Have your cholesterol levels checked more often if:  Your lipid or cholesterol levels are high.  You are older than 63 years of age.  You are at high risk for heart disease. What should I know about cancer screening? Depending on your health history and family history, you may need to have cancer screening at various ages. This may include screening for:  Breast cancer.  Cervical cancer.  Colorectal cancer.  Skin cancer.  Lung cancer. What should I know about heart disease, diabetes, and high blood  pressure? Blood pressure and heart disease  High blood pressure causes heart disease and increases the risk of stroke. This is more likely to develop in people who have high blood pressure readings, are of African descent, or are overweight.  Have your blood pressure checked: ? Every 3-5 years if you are 18-39 years of age. ? Every year if you are 40 years old or older. Diabetes Have regular diabetes screenings. This checks your fasting blood sugar level. Have the screening done:  Once every three years after age 40 if you are at a normal weight and have a low risk for diabetes.  More often and at a younger age if you are overweight or have a high risk for diabetes. What should I know about preventing infection? Hepatitis B If you have a higher risk for hepatitis B, you should be screened for this virus. Talk with your health care provider to find out if you are at risk for hepatitis B infection. Hepatitis C Testing is recommended for:  Everyone born from 1945 through 1965.  Anyone with known risk factors for hepatitis C. Sexually transmitted infections (STIs)  Get screened for STIs, including gonorrhea and chlamydia, if: ? You are sexually active and are younger than 63 years of age. ? You are older than 63 years of age and your health care provider tells you that you are at risk for this type of infection. ? Your sexual activity has changed since you were last screened, and you are at increased risk for chlamydia or gonorrhea. Ask your health care provider if   you are at risk.  Ask your health care provider about whether you are at high risk for HIV. Your health care provider may recommend a prescription medicine to help prevent HIV infection. If you choose to take medicine to prevent HIV, you should first get tested for HIV. You should then be tested every 3 months for as long as you are taking the medicine. Pregnancy  If you are about to stop having your period (premenopausal) and  you may become pregnant, seek counseling before you get pregnant.  Take 400 to 800 micrograms (mcg) of folic acid every day if you become pregnant.  Ask for birth control (contraception) if you want to prevent pregnancy. Osteoporosis and menopause Osteoporosis is a disease in which the bones lose minerals and strength with aging. This can result in bone fractures. If you are 65 years old or older, or if you are at risk for osteoporosis and fractures, ask your health care provider if you should:  Be screened for bone loss.  Take a calcium or vitamin D supplement to lower your risk of fractures.  Be given hormone replacement therapy (HRT) to treat symptoms of menopause. Follow these instructions at home: Lifestyle  Do not use any products that contain nicotine or tobacco, such as cigarettes, e-cigarettes, and chewing tobacco. If you need help quitting, ask your health care provider.  Do not use street drugs.  Do not share needles.  Ask your health care provider for help if you need support or information about quitting drugs. Alcohol use  Do not drink alcohol if: ? Your health care provider tells you not to drink. ? You are pregnant, may be pregnant, or are planning to become pregnant.  If you drink alcohol: ? Limit how much you use to 0-1 drink a day. ? Limit intake if you are breastfeeding.  Be aware of how much alcohol is in your drink. In the [place] one drink equals one 12 oz bottle of beer (355 mL), one 5 oz glass of wine (148 mL), or one 1 oz glass of hard liquor (44 mL). General instructions  Schedule regular health, dental, and eye exams.  Stay current with your vaccines.  Tell your health care provider if: ? You often feel depressed. ? You have ever been abused or do not feel safe at home. Summary  Adopting a healthy lifestyle and getting preventive care are important in promoting health and wellness.  Follow your health care provider's instructions about healthy  diet, exercising, and getting tested or screened for diseases.  Follow your health care provider's instructions on monitoring your cholesterol and blood pressure. This information is not intended to replace advice given to you by your health care provider. Make sure you discuss any questions you have with your health care provider. Document Released: 11/23/2010 Document Revised: 05/03/2018 Document Reviewed: 05/03/2018 Elsevier Patient Education  2020 [employer]  

## 2019-03-26 NOTE — Progress Notes (Signed)
Subjective:     Patient ID: Madison Wells , female    DOB: 1955/07/22 , 63 y.o.   MRN: [id number]   Chief Complaint  Patient presents with  . Annual Exam    HPI The patient states she uses post menopausal status for birth control. Last LMP was Patient's last menstrual period was 08/13/2011..  Mammogram last done 2018 Negative for: breast discharge, breast lump(s), breast pain and breast self exam.  Pertinent negatives include abnormal bleeding (hematology), anxiety, decreased libido, depression, difficulty falling sleep, dyspareunia, history of infertility, nocturia, sexual dysfunction, sleep disturbances, urinary incontinence, urinary urgency, vaginal discharge and vaginal itching. Diet regular, she is cutting back on different foods.  The patient states her exercise level is minimal    The patient's tobacco use is:  Social History   Tobacco Use  Smoking Status Current Every Day Smoker  . Packs/day: 0.50  . Years: 40.00  . Pack years: 20.00  . Types: Cigarettes  Smokeless Tobacco Never Used   She has been exposed to passive smoke. The patient's alcohol use is:  Social History   Substance and Sexual Activity  Alcohol Use No  . Alcohol/week: 0.0 standard drinks   Additional information: Last pap 02/2017, next one scheduled for 2021   Here for HM  Wt Readings from Last 3 Encounters: 03/26/19 : 259 lb 9.6 oz (117.8 kg) 12/11/18 : 262 lb (118.8 kg) 12/03/18 : 262 lb 9.1 oz (119.1 kg)     Past Medical History:  Diagnosis Date  . Angina   . Anxiety   . Bipolar 1 disorder ([place])   . Cervicalgia   . Chronic pain syndrome   . Diabetes mellitus without complication ([place])   . Disturbance of skin sensation   . Dizziness and giddiness   . Enthesopathy of hip region   . Headache   . Hypertension   . Lumbago   . Obesity   . Pain in joint, hand      Family History  Problem Relation Age of Onset  . Alcohol abuse Mother   . Alcohol abuse Father   . Alcohol abuse  Sister   . Alcohol abuse Brother      Current Outpatient Medications:  .  alendronate (FOSAMAX) 70 MG tablet, TAKE 1 TABLET EVERY WEEK IN THE MORNING AT LEAST 30 MINS BEFORE FIRST FOOD/BEVERAGE/OR MEDS OF DAY, Disp: 4 tablet, Rfl: 2 .  aspirin EC 81 MG tablet, Take 81 mg by mouth daily., Disp: , Rfl:  .  atorvastatin (LIPITOR) 10 MG tablet, Take 1 tablet (10 mg total) by mouth daily., Disp: 30 tablet, Rfl: 2 .  buPROPion (WELLBUTRIN XL) 150 MG 24 hr tablet, TAKE 1 TABLET BY MOUTH EVERY DAY, Disp: 90 tablet, Rfl: 0 .  Calcium Carbonate-Vitamin D (CALCIUM 500 + D) 500-125 MG-UNIT TABS, Take 1 tablet by mouth daily at 12 noon., Disp: , Rfl:  .  cyclobenzaprine (FLEXERIL) 10 MG tablet, Take 1 tablet (10 mg total) by mouth 3 (three) times daily as needed for muscle spasms., Disp: 30 tablet, Rfl: 1 .  furosemide (LASIX) 40 MG tablet, Take 1 tablet (40 mg total) by mouth daily., Disp: 90 tablet, Rfl: 0 .  gabapentin (NEURONTIN) 300 MG capsule, TAKE 1 CAPSULE BY MOUTH THREE TIMES A DAY, Disp: 90 capsule, Rfl: 1 .  hydrochlorothiazide (HYDRODIURIL) 12.5 MG tablet, TAKE 1 TABLET BY MOUTH EVERY DAY, Disp: 90 tablet, Rfl: 1 .  ibuprofen (ADVIL,MOTRIN) 800 MG tablet, Take 800 mg by mouth 3 (three)  times daily., Disp: , Rfl:  .  metFORMIN (GLUCOPHAGE) 500 MG tablet, Take 1 tablet (500 mg total) by mouth 2 (two) times daily with a meal., Disp: 180 tablet, Rfl: 1 .  Multiple Vitamins-Minerals (MULTIVITAMIN WITH MINERALS) tablet, Take 1 tablet by mouth daily., Disp: , Rfl:  .  oxybutynin (DITROPAN) 5 MG tablet, Take 1 tablet (5 mg total) by mouth daily., Disp: 90 tablet, Rfl: 1 .  oxyCODONE HCl 15 MG TABA, Take 10 mg by mouth 4 (four) times daily. , Disp: , Rfl:  .  PARoxetine (PAXIL) 10 MG tablet, TAKE 1 TABLET BY MOUTH EVERY DAY, Disp: 90 tablet, Rfl: 1 .  phentermine 15 MG capsule, Take 15 mg by mouth daily., Disp: , Rfl:  .  potassium chloride SA (KLOR-CON M20) 20 MEQ tablet, Take 2 tablets by mouth daily,  Disp: 180 tablet, Rfl: 0 .  spironolactone (ALDACTONE) 25 MG tablet, TAKE 1 TABLET BY MOUTH EVERY DAY, Disp: 30 tablet, Rfl: 2 .  Suvorexant (BELSOMRA) 15 MG TABS, Take 1 tablet by mouth at bedtime as needed., Disp: 30 tablet, Rfl: 5 .  VITAMIN D PO, Take 1 tablet by mouth daily., Disp: , Rfl:  .  ketoconazole (NIZORAL) 2 % shampoo, Apply 1 application topically 2 (two) times a week. , Disp: , Rfl: 1 .  mometasone (ELOCON) 0.1 % cream, APPLY TO AFFECTED AREA A THIN LAYER DAILY (Patient not taking: No sig reported), Disp: 60 g, Rfl: 2   No Known Allergies   Review of Systems  Constitutional: Negative.   HENT: Negative.   Eyes: Negative.   Respiratory: Negative.   Cardiovascular: Negative.   Gastrointestinal: Negative.   Endocrine: Negative.   Genitourinary: Negative.   Musculoskeletal: Negative.   Skin: Negative.   Allergic/Immunologic: Negative.   Neurological: Negative.   Hematological: Negative.   Psychiatric/Behavioral: Negative.      Today's Vitals   03/26/19 0857  BP: 120/78  Pulse: 84  Temp: 98.4 F (36.9 C)  TempSrc: Oral  Weight: 259 lb 9.6 oz (117.8 kg)  Height: 5' 4.6" (1.641 m)  PainSc: 0-No pain   Body mass index is 43.74 kg/m.   Objective:  Physical Exam Constitutional:      Appearance: She is well-developed.  HENT:     Head: Normocephalic and atraumatic.     Right Ear: External ear normal.     Left Ear: External ear normal.     Nose: Nose normal.  Eyes:     Conjunctiva/sclera: Conjunctivae normal.     Pupils: Pupils are equal, round, and reactive to light.  Neck:     Musculoskeletal: Normal range of motion and neck supple.  Cardiovascular:     Rate and Rhythm: Normal rate and regular rhythm.     Heart sounds: Normal heart sounds.  Pulmonary:     Effort: Pulmonary effort is normal.     Breath sounds: Normal breath sounds.  Abdominal:     General: Bowel sounds are normal.     Palpations: Abdomen is soft.  Musculoskeletal: Normal range of  motion.  Skin:    General: Skin is dry.     Findings: Erythema present.  Neurological:     Mental Status: She is alert and oriented to person, place, and time.         Assessment And Plan:     1. Health maintenance examination  Behavior modifications discussed and diet history reviewed.   Pt will continue to exercise regularly and modify diet with low GI,  plant based foods and decrease intake of processed foods.   Recommend intake of daily multivitamin, Vitamin D, and calcium. Recommend mammogram and colonoscopy for preventive screenings, as well as recommend immunizations that include influenza, TDAP, and Shingles - CMP14 + Anion Gap - Hemoglobin A1c - CBC no Diff - Lipid Profile - HIV antibody (with reflex)   2. Encounter for screening mammogram for breast cancer - MM Digital Screening; Future  3. Encounter for screening for malignant neoplasm of colon  Referral to be made to GI Dr. Collene Mares, will contact them to see if they have receied  4. Need for influenza vaccination  Influenza vaccine administered  Encouraged to take Tylenol as needed for fever or muscle aches.  - Flu Vaccine QUAD 6+ mos PF IM (Fluarix Quad PF)  5. Essential hypertension  Chronic, controlled  Continue with current medications  EKG done with NSR  - EKG 12-Lead - POCT UA - Microalbumin - POCT Urinalysis Dipstick (81002)     1. Essential hypertension . B/P is controlled.  . CMP ordered to check renal function.  . The importance of regular exercise and dietary modification was stressed to the patient.  . Stressed importance of losing ten percent of her body weight to help with B/P control.  - POCT UA - Microalbumin - POCT Urinalysis Dipstick (81002) - EKG 12-Lead - CBC - CMP14+EGFR  2. Need for influenza vaccination  Influenza vaccine given in office  Advised to take Tylenol as needed for muscle aches or fever - Flu Vaccine QUAD 6+ mos PF IM (Fluarix Quad PF)  3. Morbid  obesity, unspecified obesity type ([place])  Chronic  Discussed healthy diet and regular exercise options   Encouraged to exercise at least 150 minutes per week with 2 days of strength training  She reports her pain provider has her on a weight loss medication - Hemoglobin A1c  4. Insomnia, unspecified type  Chronic, continue with belsomra  She is doing well - Suvorexant (BELSOMRA) 15 MG TABS; Take 1 tablet by mouth at bedtime as needed.  Dispense: 30 tablet; Refill: 5  5. Health maintenance examination . Behavior modifications discussed and diet history reviewed.   . Pt will continue to exercise regularly and modify diet with low GI, plant based foods and decrease intake of processed foods.  . Recommend intake of daily multivitamin, Vitamin D, and calcium.  . Recommend mammogram and colonoscopy for preventive screenings, as well as recommend immunizations that include influenza, TDAP  6. Encounter for hepatitis C screening test for low risk patient  Will check for Hepatitis C screening due to being born between the years 07-1963 - Hepatitis C antibody  7. Mixed hyperlipidemia  Chronic, controlled  Continue with current medications - CMP14+EGFR - Lipid panel  8. Vitamin D deficiency  Will check vitamin D level and supplement as needed.     Also encouraged to spend 15 minutes in the sun daily.  - VITAMIN D 25 Hydroxy (Vit-D Deficiency, Fractures)  9. Positive colorectal cancer screening using Cologuard test  She had a positive cologuard and I placed a referral to GI however she says she has not heard from them.  I will follow up with the referral specialist     [name], FNP

## 2019-03-27 ENCOUNTER — Encounter: Payer: Self-pay | Admitting: Nurse Practitioner

## 2019-03-27 ENCOUNTER — Other Ambulatory Visit: Payer: Self-pay

## 2019-03-27 DIAGNOSIS — R899 Unspecified abnormal finding in specimens from other organs, systems and tissues: Secondary | ICD-10-CM

## 2019-03-27 LAB — CBC
Hematocrit: 40.4 % (ref 34.0–46.6)
Hemoglobin: 13.5 g/dL (ref 11.1–15.9)
MCH: 30.3 pg (ref 26.6–33.0)
MCHC: 33.4 g/dL (ref 31.5–35.7)
MCV: 91 fL (ref 79–97)
Platelets: 238 10*3/uL (ref 150–450)
RBC: 4.45 x10E6/uL (ref 3.77–5.28)
RDW: 12.1 % (ref 11.7–15.4)
WBC: 11.6 10*3/uL — ABNORMAL HIGH (ref 3.4–10.8)

## 2019-03-27 LAB — CMP14+EGFR
ALT: 15 IU/L (ref 0–32)
AST: 12 IU/L (ref 0–40)
Albumin/Globulin Ratio: 1.9 (ref 1.2–2.2)
Albumin: 4.1 g/dL (ref 3.8–4.8)
Alkaline Phosphatase: 89 IU/L (ref 39–117)
BUN/Creatinine Ratio: 14 (ref 12–28)
BUN: 14 mg/dL (ref 8–27)
Bilirubin Total: 0.2 mg/dL (ref 0.0–1.2)
CO2: 29 mmol/L (ref 20–29)
Calcium: 10.1 mg/dL (ref 8.7–10.3)
Chloride: 102 mmol/L (ref 96–106)
Creatinine, Ser: 0.98 mg/dL (ref 0.57–1.00)
GFR calc Af Amer: 71 mL/min/{1.73_m2} (ref >59)
GFR calc non Af Amer: 62 mL/min/{1.73_m2} (ref >59)
Globulin, Total: 2.2 g/dL (ref 1.5–4.5)
Glucose: 115 mg/dL — ABNORMAL HIGH (ref 65–99)
Potassium: 4.6 mmol/L (ref 3.5–5.2)
Sodium: 140 mmol/L (ref 134–144)
Total Protein: 6.3 g/dL (ref 6.0–8.5)

## 2019-03-27 LAB — LIPID PANEL
Chol/HDL Ratio: 1.8 ratio (ref 0.0–4.4)
Cholesterol, Total: 107 mg/dL (ref 100–199)
HDL: 60 mg/dL (ref >39)
LDL Chol Calc (NIH): 31 mg/dL (ref 0–99)
Triglycerides: 83 mg/dL (ref 0–149)
VLDL Cholesterol Cal: 16 mg/dL (ref 5–40)

## 2019-03-27 LAB — VITAMIN D 25 HYDROXY (VIT D DEFICIENCY, FRACTURES): Vit D, 25-Hydroxy: 63.6 ng/mL (ref 30.0–100.0)

## 2019-03-27 LAB — HEMOGLOBIN A1C
Est. average glucose Bld gHb Est-mCnc: 117 mg/dL
Hgb A1c MFr Bld: 5.7 % — ABNORMAL HIGH (ref 4.8–5.6)

## 2019-03-27 LAB — HEPATITIS C ANTIBODY: Hep C Virus Ab: <0.1 s/co ratio (ref 0.0–0.9)

## 2019-04-01 ENCOUNTER — Encounter: Payer: Self-pay | Admitting: Nurse Practitioner

## 2019-04-05 ENCOUNTER — Encounter: Payer: Self-pay | Admitting: Nurse Practitioner

## 2019-04-05 ENCOUNTER — Other Ambulatory Visit: Payer: Medicaid Other

## 2019-04-09 ENCOUNTER — Other Ambulatory Visit: Payer: Self-pay | Admitting: Nurse Practitioner

## 2019-04-09 ENCOUNTER — Other Ambulatory Visit: Payer: Self-pay

## 2019-04-09 ENCOUNTER — Ambulatory Visit: Payer: Medicaid Other | Admitting: Nurse Practitioner

## 2019-04-09 ENCOUNTER — Other Ambulatory Visit: Payer: Medicaid Other

## 2019-04-09 VITALS — BP 130/68 | HR 80 | Temp 98.6°F | Ht 64.6 in | Wt 259.2 lb

## 2019-04-09 DIAGNOSIS — R2231 Localized swelling, mass and lump, right upper limb: Secondary | ICD-10-CM

## 2019-04-09 DIAGNOSIS — R200 Anesthesia of skin: Secondary | ICD-10-CM

## 2019-04-09 DIAGNOSIS — R202 Paresthesia of skin: Secondary | ICD-10-CM | POA: Diagnosis not present

## 2019-04-09 DIAGNOSIS — W06XXXA Fall from bed, initial encounter: Secondary | ICD-10-CM | POA: Diagnosis not present

## 2019-04-09 LAB — CBC
Hematocrit: 40.8 % (ref 34.0–46.6)
Hemoglobin: 14.1 g/dL (ref 11.1–15.9)
MCH: 31.1 pg (ref 26.6–33.0)
MCHC: 34.6 g/dL (ref 31.5–35.7)
MCV: 90 fL (ref 79–97)
Platelets: 193 10*3/uL (ref 150–450)
RBC: 4.54 x10E6/uL (ref 3.77–5.28)
RDW: 11.8 % (ref 11.7–15.4)
WBC: 8.4 10*3/uL (ref 3.4–10.8)

## 2019-04-09 NOTE — Progress Notes (Signed)
Subjective:     Patient ID: Madison Wells , female    DOB: 1956-05-22 , 63 y.o.   MRN: [id number]   Chief Complaint  Patient presents with  . Mass    on hand     HPI  Right hand has a lump to the top of her right hand has been present for 6 months, larger within the last 1-2 months.  She feels is getting larger.  She will have numbness to her hand intermittently.  Her pain doctor wants to do an MRI in the next 6 weeks.    Wt Readings from Last 3 Encounters: 04/09/19 : 259 lb 3.2 oz (117.6 kg) 03/26/19 : 259 lb 9.6 oz (117.8 kg) 12/11/18 : 262 lb (118.8 kg)   Fall The accident occurred 12 to 24 hours ago. Fall occurred: fell out of bed yesterday. The pain is present in the right hand (left side pain, right posterior hand with bruising). Pertinent negatives include no abdominal pain, numbness or tingling. She has tried acetaminophen and NSAID for the symptoms.     Past Medical History:  Diagnosis Date  . AKI (acute kidney injury) ([place]) 02/02/2017  . Angina   . Anxiety   . Bipolar 1 disorder ([place])   . Cervicalgia   . Chronic pain syndrome   . Diabetes mellitus without complication ([place])   . Disturbance of skin sensation   . Dizziness and giddiness   . Enthesopathy of hip region   . Headache   . Hypertension   . Hypokalemia 02/02/2017  . Lumbago   . Obesity   . Pain in joint, hand      Family History  Problem Relation Age of Onset  . Alcohol abuse Mother   . Alcohol abuse Father   . Alcohol abuse Sister   . Alcohol abuse Brother      Current Outpatient Medications:  .  alendronate (FOSAMAX) 70 MG tablet, TAKE 1 TABLET EVERY WEEK IN THE MORNING AT LEAST 30 MINS BEFORE FIRST FOOD/BEVERAGE/OR MEDS OF DAY, Disp: 4 tablet, Rfl: 2 .  aspirin EC 81 MG tablet, Take 81 mg by mouth daily., Disp: , Rfl:  .  atorvastatin (LIPITOR) 10 MG tablet, Take 1 tablet (10 mg total) by mouth daily., Disp: 30 tablet, Rfl: 2 .  buPROPion (WELLBUTRIN XL) 150 MG 24 hr tablet, TAKE 1  TABLET BY MOUTH EVERY DAY, Disp: 90 tablet, Rfl: 0 .  Calcium Carbonate-Vitamin D (CALCIUM 500 + D) 500-125 MG-UNIT TABS, Take 1 tablet by mouth daily at 12 noon., Disp: , Rfl:  .  cyclobenzaprine (FLEXERIL) 10 MG tablet, Take 1 tablet (10 mg total) by mouth 3 (three) times daily as needed for muscle spasms., Disp: 30 tablet, Rfl: 1 .  furosemide (LASIX) 40 MG tablet, Take 1 tablet (40 mg total) by mouth daily., Disp: 90 tablet, Rfl: 0 .  gabapentin (NEURONTIN) 300 MG capsule, TAKE 1 CAPSULE BY MOUTH THREE TIMES A DAY, Disp: 90 capsule, Rfl: 1 .  hydrochlorothiazide (HYDRODIURIL) 12.5 MG tablet, TAKE 1 TABLET BY MOUTH EVERY DAY, Disp: 90 tablet, Rfl: 1 .  ibuprofen (ADVIL,MOTRIN) 800 MG tablet, Take 800 mg by mouth 3 (three) times daily., Disp: , Rfl:  .  ketoconazole (NIZORAL) 2 % shampoo, Apply 1 application topically 2 (two) times a week. , Disp: , Rfl: 1 .  metFORMIN (GLUCOPHAGE) 500 MG tablet, Take 1 tablet (500 mg total) by mouth 2 (two) times daily with a meal., Disp: 180 tablet, Rfl: 1 .  mometasone (ELOCON) 0.1 % cream, APPLY TO AFFECTED AREA A THIN LAYER DAILY (Patient not taking: No sig reported), Disp: 60 g, Rfl: 2 .  Multiple Vitamins-Minerals (MULTIVITAMIN WITH MINERALS) tablet, Take 1 tablet by mouth daily., Disp: , Rfl:  .  oxybutynin (DITROPAN) 5 MG tablet, Take 1 tablet (5 mg total) by mouth daily., Disp: 90 tablet, Rfl: 1 .  oxyCODONE HCl 15 MG TABA, Take 10 mg by mouth 4 (four) times daily. , Disp: , Rfl:  .  PARoxetine (PAXIL) 10 MG tablet, TAKE 1 TABLET BY MOUTH EVERY DAY, Disp: 90 tablet, Rfl: 1 .  phentermine 15 MG capsule, Take 15 mg by mouth daily., Disp: , Rfl:  .  potassium chloride SA (KLOR-CON M20) 20 MEQ tablet, Take 2 tablets by mouth daily, Disp: 180 tablet, Rfl: 0 .  spironolactone (ALDACTONE) 25 MG tablet, TAKE 1 TABLET BY MOUTH EVERY DAY, Disp: 30 tablet, Rfl: 2 .  Suvorexant (BELSOMRA) 15 MG TABS, Take 1 tablet by mouth at bedtime as needed., Disp: 30 tablet,  Rfl: 5 .  VITAMIN D PO, Take 1 tablet by mouth daily., Disp: , Rfl:    No Known Allergies   Review of Systems  Gastrointestinal: Negative for abdominal pain.  Neurological: Negative for tingling and numbness.     Today's Vitals   04/09/19 1053  BP: 130/68  Pulse: 80  Temp: 98.6 F (37 C)  TempSrc: Oral  Weight: 259 lb 3.2 oz (117.6 kg)  Height: 5' 4.6" (1.641 m)   Body mass index is 43.67 kg/m.   Objective:  Physical Exam Constitutional:      Appearance: Normal appearance.  Cardiovascular:     Rate and Rhythm: Normal rate and regular rhythm.  Musculoskeletal:        General: No swelling, tenderness or signs of injury.     Comments: Right hand with raised area to posterior area  Neurological:     General: No focal deficit present.     Mental Status: She is alert and oriented to person, place, and time.  Psychiatric:        Mood and Affect: Mood normal.        Behavior: Behavior normal.        Thought Content: Thought content normal.        Judgment: Judgment normal.         Assessment And Plan:     1. Mass of right hand  Feels like a ganglion cyst however this is causing her discomfort will refer to hand specialist for further evaluation - Ambulatory referral to Hand Surgery  2. Numbness and tingling in right hand  Negative phalen, positive tinel so this could be a carpal tunnel as well  3. Fall from bed, initial encounter She is unsure how she fell out of bed while sleeping but awakened upon impact, no significant injury other than left leg soreness   [name], FNP    THE PATIENT IS ENCOURAGED TO PRACTICE SOCIAL DISTANCING DUE TO THE COVID-19 PANDEMIC.

## 2019-04-10 LAB — HM MAMMOGRAPHY

## 2019-04-15 ENCOUNTER — Encounter: Payer: Self-pay | Admitting: Nurse Practitioner

## 2019-04-17 ENCOUNTER — Encounter: Payer: Self-pay | Admitting: Nurse Practitioner

## 2019-04-18 ENCOUNTER — Other Ambulatory Visit: Payer: Self-pay | Admitting: Nurse Practitioner

## 2019-04-24 ENCOUNTER — Encounter: Payer: Self-pay | Admitting: Nurse Practitioner

## 2019-05-02 ENCOUNTER — Encounter: Payer: Self-pay | Admitting: Nurse Practitioner

## 2019-05-03 ENCOUNTER — Other Ambulatory Visit: Payer: Self-pay

## 2019-05-03 ENCOUNTER — Encounter: Payer: Self-pay | Admitting: Nurse Practitioner

## 2019-05-03 ENCOUNTER — Telehealth (INDEPENDENT_AMBULATORY_CARE_PROVIDER_SITE_OTHER): Payer: Medicaid Other | Admitting: Nurse Practitioner

## 2019-05-03 VITALS — Wt 259.0 lb

## 2019-05-03 DIAGNOSIS — Z720 Tobacco use: Secondary | ICD-10-CM | POA: Diagnosis not present

## 2019-05-03 DIAGNOSIS — Z20822 Contact with and (suspected) exposure to covid-19: Secondary | ICD-10-CM

## 2019-05-03 DIAGNOSIS — R0989 Other specified symptoms and signs involving the circulatory and respiratory systems: Secondary | ICD-10-CM | POA: Diagnosis not present

## 2019-05-03 DIAGNOSIS — Z20828 Contact with and (suspected) exposure to other viral communicable diseases: Secondary | ICD-10-CM | POA: Diagnosis not present

## 2019-05-03 MED ORDER — AZITHROMYCIN 250 MG PO TABS: ORAL_TABLET | ORAL | 0 refills | Status: AC

## 2019-05-03 MED ORDER — GUAIFENESIN ER 600 MG PO TB12: 600.0000 mg | ORAL_TABLET | Freq: Two times a day (BID) | ORAL | 2 refills | Status: AC

## 2019-05-03 NOTE — Progress Notes (Signed)
Virtual Visit via Video   This visit type was conducted due to national recommendations for restrictions regarding the COVID-19 Pandemic (e.g. social distancing) in an effort to limit this patient's exposure and mitigate transmission in our community.  Due to her co-morbid illnesses, this patient is at least at moderate risk for complications without adequate follow up.  This format is felt to be most appropriate for this patient at this time.  All issues noted in this document were discussed and addressed.  A limited physical exam was performed with this format.    This visit type was conducted due to national recommendations for restrictions regarding the COVID-19 Pandemic (e.g. social distancing) in an effort to limit this patient's exposure and mitigate transmission in our community.  Patients identity confirmed using two different identifiers.  This format is felt to be most appropriate for this patient at this time.  All issues noted in this document were discussed and addressed.  No physical exam was performed (except for noted visual exam findings with Video Visits).    Date:  05/03/2019   ID:  Madison, Wells May 12, 1956, MRN [id number]  Patient Location:  Car - spoke with Madison Wells  Provider location:   Office    Chief Complaint:  covid exposure from her husband and cough  History of Present Illness:    Madison Wells is a 63 y.o. female who presents via video conferencing for a telehealth visit today.    The patient does have symptoms concerning for COVID-19 infection (fever, chills, cough, or new shortness of breath).   She has been exposed to her husband who is covid positive. They do live in the same house.   She has fallen twice once getting out of shower and last night tripped over her water.  No injuries.    URI  This is a new problem. The current episode started in the past 7 days (3 days). The problem has been gradually worsening. There has  been no fever. Pertinent negatives include no abdominal pain, congestion, coughing (productive but unable to expectorate), joint pain, nausea or sore throat. She has tried nothing for the symptoms. The treatment provided no relief.     Past Medical History:  Diagnosis Date  . AKI (acute kidney injury) ([place]) 02/02/2017  . Angina   . Anxiety   . Bipolar 1 disorder ([place])   . Cervicalgia   . Chronic pain syndrome   . Diabetes mellitus without complication ([place])   . Disturbance of skin sensation   . Dizziness and giddiness   . Enthesopathy of hip region   . Headache   . Hypertension   . Hypokalemia 02/02/2017  . Lumbago   . Obesity   . Pain in joint, hand    Past Surgical History:  Procedure Laterality Date  . CESAREAN SECTION    . KNEE ARTHROPLASTY    . TONSILLECTOMY       Current Meds  Medication Sig  . alendronate (FOSAMAX) 70 MG tablet TAKE 1 TABLET EVERY WEEK IN THE MORNING AT LEAST 30 MINS BEFORE FIRST FOOD/BEVERAGE/OR MEDS OF DAY  . aspirin EC 81 MG tablet Take 81 mg by mouth daily.  Marland Kitchen atorvastatin (LIPITOR) 10 MG tablet TAKE 1 TABLET BY MOUTH EVERY DAY  . buPROPion (WELLBUTRIN XL) 150 MG 24 hr tablet TAKE 1 TABLET BY MOUTH EVERY DAY  . Calcium Carbonate-Vitamin D (CALCIUM 500 + D) 500-125 MG-UNIT TABS Take 1 tablet by mouth daily at 12 noon.  Marland Kitchen  cyclobenzaprine (FLEXERIL) 10 MG tablet Take 1 tablet (10 mg total) by mouth 3 (three) times daily as needed for muscle spasms.  Marland Kitchen gabapentin (NEURONTIN) 300 MG capsule TAKE 1 CAPSULE BY MOUTH THREE TIMES A DAY  . hydrochlorothiazide (HYDRODIURIL) 12.5 MG tablet TAKE 1 TABLET BY MOUTH EVERY DAY  . ibuprofen (ADVIL,MOTRIN) 800 MG tablet Take 800 mg by mouth 3 (three) times daily.  Marland Kitchen ketoconazole (NIZORAL) 2 % shampoo Apply 1 application topically 2 (two) times a week.   . metFORMIN (GLUCOPHAGE) 500 MG tablet Take 1 tablet (500 mg total) by mouth 2 (two) times daily with a meal.  . mometasone (ELOCON) 0.1 % cream APPLY TO AFFECTED AREA  A THIN LAYER DAILY  . Multiple Vitamins-Minerals (MULTIVITAMIN WITH MINERALS) tablet Take 1 tablet by mouth daily.  Marland Kitchen oxybutynin (DITROPAN) 5 MG tablet Take 1 tablet (5 mg total) by mouth daily.  Marland Kitchen oxyCODONE HCl 15 MG TABA Take 10 mg by mouth 4 (four) times daily.   Marland Kitchen PARoxetine (PAXIL) 10 MG tablet TAKE 1 TABLET BY MOUTH EVERY DAY  . phentermine 15 MG capsule Take 15 mg by mouth daily.  . potassium chloride SA (KLOR-CON M20) 20 MEQ tablet Take 2 tablets by mouth daily  . spironolactone (ALDACTONE) 25 MG tablet TAKE 1 TABLET BY MOUTH EVERY DAY  . Suvorexant (BELSOMRA) 15 MG TABS Take 1 tablet by mouth at bedtime as needed.  Marland Kitchen VITAMIN D PO Take 1 tablet by mouth daily.     Allergies:   Patient has no known allergies.   Social History   Tobacco Use  . Smoking status: Current Every Day Smoker    Packs/day: 0.50    Years: 40.00    Pack years: 20.00    Types: Cigarettes  . Smokeless tobacco: Never Used  . Tobacco comment: She is unable to take Chantix due increased heart rate  Substance Use Topics  . Alcohol use: No    Alcohol/week: 0.0 standard drinks  . Drug use: No     Family Hx: The patient's family history includes Alcohol abuse in her brother, father, mother, and sister.  ROS:   Please see the history of present illness.    Review of Systems  Constitutional: Negative.  Negative for chills and fever.  HENT: Negative for congestion and sore throat.   Respiratory: Negative for cough (productive but unable to expectorate).   Cardiovascular: Negative.   Gastrointestinal: Negative for abdominal pain and nausea.  Musculoskeletal: Negative for joint pain.  Neurological: Negative.   Psychiatric/Behavioral: Negative.     All other systems reviewed and are negative.   Labs/Other Tests and Data Reviewed:    Recent Labs: 12/05/2018: Magnesium 2.0 03/26/2019: ALT 15; BUN 14; Creatinine, Ser 0.98; Potassium 4.6; Sodium 140 04/09/2019: Hemoglobin 14.1; Platelets 193   Recent  Lipid Panel Lab Results  Component Value Date/Time   CHOL 107 03/26/2019 09:59 AM   TRIG 83 03/26/2019 09:59 AM   HDL 60 03/26/2019 09:59 AM   CHOLHDL 1.8 03/26/2019 09:59 AM   LDLCALC 31 03/26/2019 09:59 AM    Wt Readings from Last 3 Encounters:  05/03/19 259 lb (117.5 kg)  04/09/19 259 lb 3.2 oz (117.6 kg)  03/26/19 259 lb 9.6 oz (117.8 kg)     Exam:    Vital Signs:  Wt 259 lb (117.5 kg)   LMP 08/13/2011   BMI 43.64 kg/m     Physical Exam  Constitutional: She is oriented to person, place, and time and well-developed, well-nourished, and  in no distress. No distress.  Pulmonary/Chest: Effort normal and breath sounds normal. No respiratory distress.  Neurological: She is alert and oriented to person, place, and time.  Psychiatric: Mood, memory, affect and judgment normal.    ASSESSMENT & PLAN:    1. Chest congestion  Advised if have any shortness of breath to go to ER for evaluation - guaiFENesin (MUCINEX) 600 MG 12 hr tablet; Take 1 tablet (600 mg total) by mouth 2 (two) times daily.  Dispense: 60 tablet; Refill: 2 - Novel Coronavirus, NAA ([facility]) - azithromycin (ZITHROMAX) 250 MG tablet; Take 2 tablets (500 mg) on  Day 1,  followed by 1 tablet (250 mg) once daily on Days 2 through 5.  Dispense: 6 each; Refill: 0  2. Close exposure to COVID-19 virus  She is advised to go to [facility] for covid testing tomorrow and to remain in quarantine at least until she has her test results back or without symptoms for 3 days   COVID-19 Education: The signs and symptoms of COVID-19 were discussed with the patient and how to seek care for testing (follow up with PCP or arrange E-visit).  The importance of social distancing was discussed today.  Patient Risk:   After full review of this patients clinical status, I feel that they are at least moderate risk at this time.  Time:   Today, I have spent 10 minutes/ seconds with the patient with telehealth technology discussing above  diagnoses.     Medication Adjustments/Labs and Tests Ordered: Current medicines are reviewed at length with the patient today.  Concerns regarding medicines are outlined above.   Tests Ordered: No orders of the defined types were placed in this encounter.   Medication Changes: No orders of the defined types were placed in this encounter.   Disposition:  Follow up prn  Signed, [name], FNP

## 2019-05-05 LAB — NOVEL CORONAVIRUS, NAA: SARS-CoV-2, NAA: NOT DETECTED

## 2019-05-07 ENCOUNTER — Encounter: Payer: Self-pay | Admitting: Nurse Practitioner

## 2019-05-10 ENCOUNTER — Other Ambulatory Visit: Payer: Self-pay | Admitting: Nurse Practitioner

## 2019-05-11 ENCOUNTER — Other Ambulatory Visit: Payer: Self-pay | Admitting: Nurse Practitioner

## 2019-05-14 ENCOUNTER — Other Ambulatory Visit: Payer: Self-pay | Admitting: Nurse Practitioner

## 2019-05-14 ENCOUNTER — Telehealth: Payer: Self-pay

## 2019-05-14 DIAGNOSIS — R05 Cough: Secondary | ICD-10-CM

## 2019-05-14 DIAGNOSIS — R053 Chronic cough: Secondary | ICD-10-CM

## 2019-05-14 MED ORDER — PREDNISONE 10 MG (21) PO TBPK: ORAL_TABLET | ORAL | 0 refills | Status: DC

## 2019-05-14 MED ORDER — BUPROPION HCL ER (XL) 150 MG PO TB24: 150.0000 mg | ORAL_TABLET | Freq: Every day | ORAL | 0 refills | Status: DC

## 2019-05-14 NOTE — Telephone Encounter (Signed)
Make sure she is staying well hydrated with water, also I will send a prescription for prednisone taper

## 2019-05-14 NOTE — Telephone Encounter (Signed)
Patient stated she still has a cough but the mucus isnt coming up. YRL,RMA

## 2019-05-14 NOTE — Telephone Encounter (Signed)
-----   Message from [name], [place] sent at 05/13/2019  9:22 PM EST ----- Regarding: coronavirus test Call pt to see if she went for her coronavirus test and how she is doing ----- Message ----- From: SYSTEM Sent: 05/08/2019  12:05 AM EST To: [name], FNP

## 2019-05-15 ENCOUNTER — Other Ambulatory Visit: Payer: Self-pay | Admitting: Nurse Practitioner

## 2019-05-15 NOTE — Telephone Encounter (Signed)
I left pt v/m notifying her. YRL,RMA

## 2019-05-21 ENCOUNTER — Encounter: Payer: Self-pay | Admitting: Nurse Practitioner

## 2019-05-21 ENCOUNTER — Other Ambulatory Visit: Payer: Self-pay | Admitting: Nurse Practitioner

## 2019-05-21 ENCOUNTER — Ambulatory Visit: Payer: Medicaid Other | Attending: Internal Medicine

## 2019-05-21 DIAGNOSIS — Z20822 Contact with and (suspected) exposure to covid-19: Secondary | ICD-10-CM

## 2019-05-21 DIAGNOSIS — R600 Localized edema: Secondary | ICD-10-CM

## 2019-05-22 ENCOUNTER — Encounter: Payer: Self-pay | Admitting: Nurse Practitioner

## 2019-05-22 ENCOUNTER — Other Ambulatory Visit: Payer: Self-pay

## 2019-05-22 DIAGNOSIS — R600 Localized edema: Secondary | ICD-10-CM

## 2019-05-22 MED ORDER — OXYBUTYNIN CHLORIDE 5 MG PO TABS: 5.0000 mg | ORAL_TABLET | Freq: Every day | ORAL | 1 refills | Status: DC

## 2019-05-22 MED ORDER — SPIRONOLACTONE 25 MG PO TABS: 25.0000 mg | ORAL_TABLET | Freq: Every day | ORAL | 2 refills | Status: DC

## 2019-05-22 MED ORDER — PAROXETINE HCL 10 MG PO TABS: 10.0000 mg | ORAL_TABLET | Freq: Every day | ORAL | 1 refills | Status: DC

## 2019-05-23 ENCOUNTER — Encounter: Payer: Self-pay | Admitting: Nurse Practitioner

## 2019-05-23 LAB — NOVEL CORONAVIRUS, NAA: SARS-CoV-2, NAA: NOT DETECTED

## 2019-05-24 ENCOUNTER — Telehealth: Payer: Self-pay

## 2019-05-24 NOTE — Telephone Encounter (Signed)
Caller given negative result and verbalized understanding  

## 2019-05-28 ENCOUNTER — Encounter: Payer: Self-pay | Admitting: Nurse Practitioner

## 2019-05-30 ENCOUNTER — Encounter: Payer: Self-pay | Admitting: Nurse Practitioner

## 2019-06-13 ENCOUNTER — Ambulatory Visit: Payer: Medicaid Other | Attending: Internal Medicine

## 2019-06-13 DIAGNOSIS — Z20822 Contact with and (suspected) exposure to covid-19: Secondary | ICD-10-CM

## 2019-06-14 LAB — NOVEL CORONAVIRUS, NAA: SARS-CoV-2, NAA: NOT DETECTED

## 2019-06-25 ENCOUNTER — Ambulatory Visit: Payer: Medicaid Other | Attending: Internal Medicine

## 2019-06-25 DIAGNOSIS — Z20822 Contact with and (suspected) exposure to covid-19: Secondary | ICD-10-CM

## 2019-06-26 ENCOUNTER — Encounter: Payer: Self-pay | Admitting: Nurse Practitioner

## 2019-06-26 LAB — NOVEL CORONAVIRUS, NAA: SARS-CoV-2, NAA: NOT DETECTED

## 2019-07-11 ENCOUNTER — Encounter: Payer: Self-pay | Admitting: Nurse Practitioner

## 2019-07-16 ENCOUNTER — Other Ambulatory Visit: Payer: Self-pay | Admitting: Nurse Practitioner

## 2019-07-16 ENCOUNTER — Encounter: Payer: Self-pay | Admitting: Nurse Practitioner

## 2019-08-15 ENCOUNTER — Encounter: Payer: Self-pay | Admitting: Nurse Practitioner

## 2019-08-15 ENCOUNTER — Other Ambulatory Visit: Payer: Self-pay | Admitting: Nurse Practitioner

## 2019-09-17 ENCOUNTER — Encounter: Payer: Self-pay | Admitting: Nurse Practitioner

## 2019-09-17 ENCOUNTER — Other Ambulatory Visit: Payer: Self-pay | Admitting: Nurse Practitioner

## 2019-09-24 ENCOUNTER — Ambulatory Visit (INDEPENDENT_AMBULATORY_CARE_PROVIDER_SITE_OTHER): Payer: Medicaid Other | Admitting: Nurse Practitioner

## 2019-09-24 ENCOUNTER — Other Ambulatory Visit: Payer: Self-pay

## 2019-09-24 ENCOUNTER — Encounter: Payer: Self-pay | Admitting: Nurse Practitioner

## 2019-09-24 VITALS — BP 122/80 | HR 78 | Temp 98.5°F | Ht 64.0 in | Wt 264.8 lb

## 2019-09-24 DIAGNOSIS — Z6841 Body Mass Index (BMI) 40.0 and over, adult: Secondary | ICD-10-CM

## 2019-09-24 DIAGNOSIS — E559 Vitamin D deficiency, unspecified: Secondary | ICD-10-CM

## 2019-09-24 DIAGNOSIS — E782 Mixed hyperlipidemia: Secondary | ICD-10-CM | POA: Diagnosis not present

## 2019-09-24 DIAGNOSIS — I10 Essential (primary) hypertension: Secondary | ICD-10-CM

## 2019-09-24 DIAGNOSIS — Z9181 History of falling: Secondary | ICD-10-CM

## 2019-09-24 DIAGNOSIS — R5383 Other fatigue: Secondary | ICD-10-CM

## 2019-09-24 NOTE — Progress Notes (Signed)
Subjective:     Patient ID: Madison Wells , female    DOB: 02-07-1956 , 64 y.o.   MRN: [id number]   Chief Complaint  Patient presents with  . Hypertension  . Fall    patient has been falling a lot recently   . Fatigue    HPI  Wt Readings from Last 3 Encounters: 09/24/19 : 264 lb 12.8 oz (120.1 kg) 05/03/19 : 259 lb (117.5 kg) 04/09/19 : 259 lb 3.2 oz (117.6 kg)  She reports she has been doing a lot of walking.  She uses a pneumatic compression machine and wears wraps daily for her swelling in her legs.  She continues to go to [facility] for her chronic pain.  Hypertension This is a chronic problem. The current episode started more than 1 year ago. The problem is controlled. Pertinent negatives include no anxiety, blurred vision, chest pain, headaches, palpitations or peripheral edema. There are no associated agents to hypertension. Risk factors for coronary artery disease include obesity, sedentary lifestyle and smoking/tobacco exposure. The current treatment provides moderate improvement. There are no compliance problems.  There is no history of angina. There is no history of chronic renal disease.  Fall The accident occurred 2 days ago. Fall occurred: Once she fell asleep on the toilet, once in the kitchen does not remember what made her fall, and getting out of shower slipped on water.   The pain is moderate. The symptoms are aggravated by ambulation. Pertinent negatives include no abdominal pain or headaches. She has tried nothing for the symptoms.     Past Medical History:  Diagnosis Date  . AKI (acute kidney injury) ([place]) 02/02/2017  . Angina   . Anxiety   . Bipolar 1 disorder ([place])   . Cervicalgia   . Chronic pain syndrome   . Diabetes mellitus without complication ([place])   . Disturbance of skin sensation   . Dizziness and giddiness   . Enthesopathy of hip region   . Headache   . Hypertension   . Hypokalemia 02/02/2017  . Lumbago   . Obesity   . Pain in  joint, hand      Family History  Problem Relation Age of Onset  . Alcohol abuse Mother   . Alcohol abuse Father   . Alcohol abuse Sister   . Alcohol abuse Brother      Current Outpatient Medications:  .  alendronate (FOSAMAX) 70 MG tablet, TAKE 1 TABLET EVERY WEEK IN THE MORNING AT LEAST 30 MINS BEFORE FIRST FOOD/BEVERAGE/OR MEDS OF DAY, Disp: 4 tablet, Rfl: 2 .  aspirin EC 81 MG tablet, Take 81 mg by mouth daily., Disp: , Rfl:  .  atorvastatin (LIPITOR) 10 MG tablet, TAKE 1 TABLET BY MOUTH EVERY DAY, Disp: 90 tablet, Rfl: 1 .  buPROPion (WELLBUTRIN XL) 150 MG 24 hr tablet, TAKE 1 TABLET BY MOUTH EVERY DAY, Disp: 90 tablet, Rfl: 0 .  Calcium Carbonate-Vitamin D (CALCIUM 500 + D) 500-125 MG-UNIT TABS, Take 1 tablet by mouth daily at 12 noon., Disp: , Rfl:  .  cyclobenzaprine (FLEXERIL) 10 MG tablet, Take 1 tablet (10 mg total) by mouth 3 (three) times daily as needed for muscle spasms., Disp: 30 tablet, Rfl: 1 .  gabapentin (NEURONTIN) 300 MG capsule, TAKE 1 CAPSULE BY MOUTH THREE TIMES A DAY, Disp: 90 capsule, Rfl: 1 .  hydrochlorothiazide (HYDRODIURIL) 12.5 MG tablet, TAKE 1 TABLET BY MOUTH EVERY DAY, Disp: 90 tablet, Rfl: 1 .  ibuprofen (ADVIL,MOTRIN) 800 MG  tablet, Take 800 mg by mouth 3 (three) times daily., Disp: , Rfl:  .  metFORMIN (GLUCOPHAGE) 500 MG tablet, TAKE 1 TABLET (500 MG TOTAL) BY MOUTH 2 (TWO) TIMES DAILY WITH A MEAL., Disp: 180 tablet, Rfl: 1 .  Multiple Vitamins-Minerals (MULTIVITAMIN WITH MINERALS) tablet, Take 1 tablet by mouth daily., Disp: , Rfl:  .  oxybutynin (DITROPAN) 5 MG tablet, Take 1 tablet (5 mg total) by mouth daily., Disp: 90 tablet, Rfl: 1 .  oxyCODONE HCl 15 MG TABA, Take 10 mg by mouth 4 (four) times daily. , Disp: , Rfl:  .  PARoxetine (PAXIL) 10 MG tablet, Take 1 tablet (10 mg total) by mouth daily., Disp: 90 tablet, Rfl: 1 .  phentermine 15 MG capsule, Take 15 mg by mouth daily., Disp: , Rfl:  .  potassium chloride SA (KLOR-CON M20) 20 MEQ tablet,  TAKE 2 TABLETS BY MOUTH EVERY DAY, Disp: 180 tablet, Rfl: 0 .  spironolactone (ALDACTONE) 25 MG tablet, Take 1 tablet (25 mg total) by mouth daily., Disp: 30 tablet, Rfl: 2 .  Suvorexant (BELSOMRA) 15 MG TABS, Take 1 tablet by mouth at bedtime as needed., Disp: 30 tablet, Rfl: 5 .  buPROPion (WELLBUTRIN XL) 150 MG 24 hr tablet, Take 1 tablet (150 mg total) by mouth daily., Disp: 90 tablet, Rfl: 0 .  guaiFENesin (MUCINEX) 600 MG 12 hr tablet, Take 1 tablet (600 mg total) by mouth 2 (two) times daily. (Patient not taking: Reported on 09/24/2019), Disp: 60 tablet, Rfl: 2 .  ketoconazole (NIZORAL) 2 % shampoo, Apply 1 application topically 2 (two) times a week. , Disp: , Rfl: 1 .  mometasone (ELOCON) 0.1 % cream, APPLY TO AFFECTED AREA A THIN LAYER DAILY (Patient not taking: Reported on 09/24/2019), Disp: 60 g, Rfl: 2   No Known Allergies   Review of Systems  Constitutional: Negative.  Negative for fatigue.  Eyes: Negative for blurred vision.  Respiratory: Negative.  Negative for cough.   Cardiovascular: Negative.  Negative for chest pain, palpitations and leg swelling.  Gastrointestinal: Negative for abdominal pain.  Musculoskeletal: Negative for gait problem.       Bilateral hip pain   Neurological: Negative for dizziness, syncope and headaches.  Hematological: Negative.      Today's Vitals   09/24/19 0842  BP: 122/80  Pulse: 78  Temp: 98.5 F (36.9 C)  TempSrc: Oral  Weight: 264 lb 12.8 oz (120.1 kg)  Height: '5\' 4"'  (1.626 m)  PainSc: 8   PainLoc: Leg   Body mass index is 45.45 kg/m.   Objective:  Physical Exam Vitals reviewed.  Constitutional:      Appearance: Normal appearance. She is well-developed. She is obese.  Cardiovascular:     Rate and Rhythm: Normal rate and regular rhythm.     Pulses: Normal pulses.     Heart sounds: Normal heart sounds. No murmur.  Pulmonary:     Effort: Pulmonary effort is normal.     Breath sounds: Normal breath sounds.  Abdominal:      General: Bowel sounds are normal.     Palpations: Abdomen is soft.  Musculoskeletal:        General: Normal range of motion.  Skin:    General: Skin is dry.     Capillary Refill: Capillary refill takes less than 2 seconds.     Findings: Erythema present.  Neurological:     General: No focal deficit present.     Mental Status: She is alert and oriented to person,  place, and time.  Psychiatric:        Mood and Affect: Mood normal.        Behavior: Behavior normal.        Thought Content: Thought content normal.        Judgment: Judgment normal.         Assessment And Plan:     1. Essential hypertension  Chronic, good control  I have given her a prescription for a blood pressure cuff for home  If below 90/60 or higher than 150/90 she is to call to office - CMP14+EGFR  2. Mixed hyperlipidemia  Chronic, controlled  Continue with current medications - Lipid panel  3. Morbid obesity, unspecified obesity type ([place])  Chronic, continue with increasing physical activity - Hemoglobin A1c  4. Vitamin D deficiency  Will check vitamin D level and supplement as needed.     Also encouraged to spend 15 minutes in the sun daily.   5. Fatigue, unspecified type  Has been fatigued since having her covid vaccine  Will check for metabolic causes  She has already had a sleep study done which was normal.   May be a side effect of the covid vaccine, continue to monitor.  - TSH - Vitamin B12 - CBC   [name], FNP    THE PATIENT IS ENCOURAGED TO PRACTICE SOCIAL DISTANCING DUE TO THE COVID-19 PANDEMIC.

## 2019-09-24 NOTE — Patient Instructions (Signed)
Check blood pressure once a day and if not feeling well  Call to office if blood pressure less than 90/60 or higher than 150/90 to make [place] aware.

## 2019-09-25 LAB — LIPID PANEL
Chol/HDL Ratio: 1.6 ratio (ref 0.0–4.4)
Cholesterol, Total: 98 mg/dL — ABNORMAL LOW (ref 100–199)
HDL: 61 mg/dL (ref >39)
LDL Chol Calc (NIH): 23 mg/dL (ref 0–99)
Triglycerides: 61 mg/dL (ref 0–149)
VLDL Cholesterol Cal: 14 mg/dL (ref 5–40)

## 2019-09-25 LAB — CMP14+EGFR
ALT: 18 IU/L (ref 0–32)
AST: 25 IU/L (ref 0–40)
Albumin/Globulin Ratio: 1.7 (ref 1.2–2.2)
Albumin: 3.8 g/dL (ref 3.8–4.8)
Alkaline Phosphatase: 102 IU/L (ref 39–117)
BUN/Creatinine Ratio: 13 (ref 12–28)
BUN: 10 mg/dL (ref 8–27)
Bilirubin Total: 0.5 mg/dL (ref 0.0–1.2)
CO2: 25 mmol/L (ref 20–29)
Calcium: 10 mg/dL (ref 8.7–10.3)
Chloride: 100 mmol/L (ref 96–106)
Creatinine, Ser: 0.78 mg/dL (ref 0.57–1.00)
GFR calc Af Amer: 94 mL/min/{1.73_m2} (ref >59)
GFR calc non Af Amer: 81 mL/min/{1.73_m2} (ref >59)
Globulin, Total: 2.3 g/dL (ref 1.5–4.5)
Glucose: 120 mg/dL — ABNORMAL HIGH (ref 65–99)
Potassium: 3.7 mmol/L (ref 3.5–5.2)
Sodium: 140 mmol/L (ref 134–144)
Total Protein: 6.1 g/dL (ref 6.0–8.5)

## 2019-09-25 LAB — CBC
Hematocrit: 38 % (ref 34.0–46.6)
Hemoglobin: 13 g/dL (ref 11.1–15.9)
MCH: 30.2 pg (ref 26.6–33.0)
MCHC: 34.2 g/dL (ref 31.5–35.7)
MCV: 88 fL (ref 79–97)
Platelets: 177 10*3/uL (ref 150–450)
RBC: 4.3 x10E6/uL (ref 3.77–5.28)
RDW: 11.9 % (ref 11.7–15.4)
WBC: 11.4 10*3/uL — ABNORMAL HIGH (ref 3.4–10.8)

## 2019-09-25 LAB — HEMOGLOBIN A1C
Est. average glucose Bld gHb Est-mCnc: 117 mg/dL
Hgb A1c MFr Bld: 5.7 % — ABNORMAL HIGH (ref 4.8–5.6)

## 2019-09-25 LAB — TSH: TSH: 1.97 u[IU]/mL (ref 0.450–4.500)

## 2019-09-25 LAB — VITAMIN D 25 HYDROXY (VIT D DEFICIENCY, FRACTURES): Vit D, 25-Hydroxy: 53.7 ng/mL (ref 30.0–100.0)

## 2019-09-25 LAB — VITAMIN B12: Vitamin B-12: 521 pg/mL (ref 232–1245)

## 2019-09-25 NOTE — Progress Notes (Signed)
Okay, have her to return in 1 week for recheck CBC with diff

## 2019-09-26 ENCOUNTER — Other Ambulatory Visit: Payer: Self-pay

## 2019-09-26 DIAGNOSIS — D72829 Elevated white blood cell count, unspecified: Secondary | ICD-10-CM

## 2019-09-27 ENCOUNTER — Other Ambulatory Visit: Payer: Self-pay

## 2019-09-27 ENCOUNTER — Encounter: Payer: Self-pay | Admitting: Nurse Practitioner

## 2019-09-27 ENCOUNTER — Other Ambulatory Visit: Payer: Self-pay | Admitting: Nurse Practitioner

## 2019-09-27 DIAGNOSIS — G4700 Insomnia, unspecified: Secondary | ICD-10-CM

## 2019-09-27 MED ORDER — MOMETASONE FUROATE 0.1 % EX CREA: TOPICAL_CREAM | CUTANEOUS | 2 refills | Status: DC

## 2019-09-27 NOTE — Telephone Encounter (Signed)
Belsomra refill

## 2019-10-04 ENCOUNTER — Other Ambulatory Visit: Payer: Self-pay

## 2019-10-08 ENCOUNTER — Encounter: Payer: Self-pay | Admitting: Nurse Practitioner

## 2019-10-08 ENCOUNTER — Other Ambulatory Visit: Payer: Medicaid Other

## 2019-10-08 ENCOUNTER — Other Ambulatory Visit: Payer: Self-pay

## 2019-10-08 LAB — CBC WITH DIFFERENTIAL/PLATELET
Basophils Absolute: 0.1 10*3/uL (ref 0.0–0.2)
Basos: 1 %
EOS (ABSOLUTE): 0.1 10*3/uL (ref 0.0–0.4)
Eos: 2 %
Hematocrit: 41.3 % (ref 34.0–46.6)
Hemoglobin: 13.9 g/dL (ref 11.1–15.9)
Immature Grans (Abs): 0 10*3/uL (ref 0.0–0.1)
Immature Granulocytes: 0 %
Lymphocytes Absolute: 2.4 10*3/uL (ref 0.7–3.1)
Lymphs: 32 %
MCH: 30.5 pg (ref 26.6–33.0)
MCHC: 33.7 g/dL (ref 31.5–35.7)
MCV: 91 fL (ref 79–97)
Monocytes Absolute: 0.6 10*3/uL (ref 0.1–0.9)
Monocytes: 8 %
Neutrophils Absolute: 4.3 10*3/uL (ref 1.4–7.0)
Neutrophils: 57 %
Platelets: 209 10*3/uL (ref 150–450)
RBC: 4.56 x10E6/uL (ref 3.77–5.28)
RDW: 12.1 % (ref 11.7–15.4)
WBC: 7.5 10*3/uL (ref 3.4–10.8)

## 2019-10-17 ENCOUNTER — Other Ambulatory Visit: Payer: Self-pay

## 2019-10-17 ENCOUNTER — Encounter: Payer: Self-pay | Admitting: Nurse Practitioner

## 2019-10-17 ENCOUNTER — Ambulatory Visit: Payer: Medicaid Other | Admitting: Nurse Practitioner

## 2019-10-17 VITALS — BP 124/82 | HR 90 | Temp 97.9°F | Ht 64.0 in | Wt 260.8 lb

## 2019-10-17 DIAGNOSIS — H6123 Impacted cerumen, bilateral: Secondary | ICD-10-CM

## 2019-10-17 NOTE — Progress Notes (Signed)
This visit occurred during the SARS-CoV-2 public health emergency.  Safety protocols were in place, including screening questions prior to the visit, additional usage of staff PPE, and extensive cleaning of exam room while observing appropriate contact time as indicated for disinfecting solutions.  Subjective:     Patient ID: Madison Wells , female    DOB: [birth year] , 64 y.o.   MRN: [id number]   Chief Complaint  Patient presents with  . Hearing Problem    (R) ear can't hear out of it     HPI  Right ear unable to hear out of it since this morning. Only hears a buzzing sound. Denies pain.  Denies congestion.      Past Medical History:  Diagnosis Date  . AKI (acute kidney injury) ([place]) 02/02/2017  . Angina   . Anxiety   . Bipolar 1 disorder ([place])   . Cervicalgia   . Chronic pain syndrome   . Diabetes mellitus without complication ([place])   . Disturbance of skin sensation   . Dizziness and giddiness   . Enthesopathy of hip region   . Headache   . Hypertension   . Hypokalemia 02/02/2017  . Lumbago   . Obesity   . Pain in joint, hand      Family History  Problem Relation Age of Onset  . Alcohol abuse Mother   . Alcohol abuse Father   . Alcohol abuse Sister   . Alcohol abuse Brother      Current Outpatient Medications:  .  alendronate (FOSAMAX) 70 MG tablet, TAKE 1 TABLET EVERY WEEK IN THE MORNING AT LEAST 30 MINS BEFORE FIRST FOOD/BEVERAGE/OR MEDS OF DAY, Disp: 4 tablet, Rfl: 2 .  aspirin EC 81 MG tablet, Take 81 mg by mouth daily., Disp: , Rfl:  .  atorvastatin (LIPITOR) 10 MG tablet, TAKE 1 TABLET BY MOUTH EVERY DAY, Disp: 90 tablet, Rfl: 1 .  BELSOMRA 15 MG TABS, TAKE 1 TABLET BY MOUTH EVERY DAY AT BEDTIME AS NEEDED, Disp: 30 tablet, Rfl: 2 .  Calcium Carbonate-Vitamin D (CALCIUM 500 + D) 500-125 MG-UNIT TABS, Take 1 tablet by mouth daily at 12 noon., Disp: , Rfl:  .  cyclobenzaprine (FLEXERIL) 10 MG tablet, Take 1 tablet (10 mg total) by mouth 3 (three) times  daily as needed for muscle spasms., Disp: 30 tablet, Rfl: 1 .  gabapentin (NEURONTIN) 300 MG capsule, TAKE 1 CAPSULE BY MOUTH THREE TIMES A DAY, Disp: 90 capsule, Rfl: 1 .  hydrochlorothiazide (HYDRODIURIL) 12.5 MG tablet, TAKE 1 TABLET BY MOUTH EVERY DAY, Disp: 90 tablet, Rfl: 1 .  ibuprofen (ADVIL,MOTRIN) 800 MG tablet, Take 800 mg by mouth 3 (three) times daily., Disp: , Rfl:  .  ketoconazole (NIZORAL) 2 % shampoo, Apply 1 application topically 2 (two) times a week. , Disp: , Rfl: 1 .  metFORMIN (GLUCOPHAGE) 500 MG tablet, TAKE 1 TABLET (500 MG TOTAL) BY MOUTH 2 (TWO) TIMES DAILY WITH A MEAL., Disp: 180 tablet, Rfl: 1 .  mometasone (ELOCON) 0.1 % cream, APPLY TO AFFECTED AREA A THIN LAYER DAILY, Disp: 60 g, Rfl: 2 .  Multiple Vitamins-Minerals (MULTIVITAMIN WITH MINERALS) tablet, Take 1 tablet by mouth daily., Disp: , Rfl:  .  oxybutynin (DITROPAN) 5 MG tablet, Take 1 tablet (5 mg total) by mouth daily., Disp: 90 tablet, Rfl: 1 .  oxyCODONE HCl 15 MG TABA, Take 10 mg by mouth 4 (four) times daily. , Disp: , Rfl:  .  PARoxetine (PAXIL) 10 MG tablet, Take 1  tablet (10 mg total) by mouth daily., Disp: 90 tablet, Rfl: 1 .  phentermine 15 MG capsule, Take 15 mg by mouth daily., Disp: , Rfl:  .  potassium chloride SA (KLOR-CON M20) 20 MEQ tablet, TAKE 2 TABLETS BY MOUTH EVERY DAY, Disp: 180 tablet, Rfl: 0 .  spironolactone (ALDACTONE) 25 MG tablet, Take 1 tablet (25 mg total) by mouth daily., Disp: 30 tablet, Rfl: 2 .  guaiFENesin (MUCINEX) 600 MG 12 hr tablet, Take 1 tablet (600 mg total) by mouth 2 (two) times daily. (Patient not taking: Reported on 10/17/2019), Disp: 60 tablet, Rfl: 2   No Known Allergies   Review of Systems   Today's Vitals   10/17/19 1529  BP: 124/82  Pulse: 90  Temp: 97.9 F (36.6 C)  TempSrc: Oral  SpO2: 94%  Weight: 260 lb 12.8 oz (118.3 kg)  Height: 5\' 4"  (1.626 m)   Body mass index is 44.77 kg/m.   Objective:  Physical Exam Constitutional:      General: She  is not in acute distress.    Appearance: Normal appearance. She is obese.  HENT:     Right Ear: External ear normal. There is impacted cerumen.     Left Ear: External ear normal. There is impacted cerumen.  Cardiovascular:     Rate and Rhythm: Normal rate and regular rhythm.     Pulses: Normal pulses.     Heart sounds: Normal heart sounds.  Pulmonary:     Effort: Pulmonary effort is normal. No respiratory distress.     Breath sounds: Normal breath sounds.  Neurological:     General: No focal deficit present.     Mental Status: She is alert and oriented to person, place, and time.  Psychiatric:        Mood and Affect: Mood normal.        Behavior: Behavior normal.        Thought Content: Thought content normal.        Judgment: Judgment normal.         Assessment And Plan:     1. Bilateral impacted cerumen  Water lavage done with good results  Her hearing improved after ears cleaned   [name], FNP    THE PATIENT IS ENCOURAGED TO PRACTICE SOCIAL DISTANCING DUE TO THE COVID-19 PANDEMIC.

## 2019-10-23 LAB — HM DIABETES EYE EXAM

## 2019-10-24 ENCOUNTER — Encounter: Payer: Self-pay | Admitting: Nurse Practitioner

## 2019-10-26 ENCOUNTER — Encounter: Payer: Self-pay | Admitting: Nurse Practitioner

## 2019-11-26 ENCOUNTER — Other Ambulatory Visit: Payer: Self-pay | Admitting: Nurse Practitioner

## 2019-11-26 DIAGNOSIS — R600 Localized edema: Secondary | ICD-10-CM

## 2019-12-04 ENCOUNTER — Other Ambulatory Visit: Payer: Self-pay | Admitting: Nurse Practitioner

## 2019-12-12 ENCOUNTER — Encounter: Payer: Self-pay | Admitting: Nurse Practitioner

## 2019-12-16 ENCOUNTER — Other Ambulatory Visit: Payer: Self-pay | Admitting: Nurse Practitioner

## 2019-12-18 ENCOUNTER — Other Ambulatory Visit: Payer: Self-pay | Admitting: Nurse Practitioner

## 2019-12-18 DIAGNOSIS — R600 Localized edema: Secondary | ICD-10-CM

## 2020-01-04 ENCOUNTER — Encounter: Payer: Self-pay | Admitting: Nurse Practitioner

## 2020-01-07 ENCOUNTER — Telehealth: Payer: Self-pay

## 2020-01-07 NOTE — Telephone Encounter (Signed)
I woke up last night and had shard time breathing Everytime I take a deep breath my ribs on my left side hurts like I was punch Thank you Declan Adamson [phone] 21-Apr-1956 Ps my insurance is [employer]   I called pt and advised her to go to the Urgent care or ER per provider. She stated the pain has stopped but she will go there if it comes back. YL,RMA

## 2020-01-08 NOTE — Telephone Encounter (Signed)
Were you able to schedule her an appt

## 2020-01-11 ENCOUNTER — Other Ambulatory Visit: Payer: Self-pay | Admitting: Nurse Practitioner

## 2020-02-05 ENCOUNTER — Other Ambulatory Visit: Payer: Self-pay | Admitting: Nurse Practitioner

## 2020-02-13 ENCOUNTER — Other Ambulatory Visit: Payer: Self-pay | Admitting: Nurse Practitioner

## 2020-03-13 ENCOUNTER — Other Ambulatory Visit: Payer: Self-pay | Admitting: Nurse Practitioner

## 2020-03-21 ENCOUNTER — Other Ambulatory Visit: Payer: Self-pay | Admitting: Nurse Practitioner

## 2020-03-21 DIAGNOSIS — R600 Localized edema: Secondary | ICD-10-CM

## 2020-03-26 ENCOUNTER — Other Ambulatory Visit (HOSPITAL_COMMUNITY)
Admission: RE | Admit: 2020-03-26 | Discharge: 2020-03-26 | Disposition: A | Discharge status: Home / Self Care | Payer: Medicaid Other | Source: Ambulatory Visit | Attending: Nurse Practitioner | Admitting: Nurse Practitioner

## 2020-03-26 ENCOUNTER — Ambulatory Visit (INDEPENDENT_AMBULATORY_CARE_PROVIDER_SITE_OTHER): Payer: Medicaid Other | Admitting: Nurse Practitioner

## 2020-03-26 ENCOUNTER — Other Ambulatory Visit: Payer: Self-pay

## 2020-03-26 ENCOUNTER — Encounter: Payer: Self-pay | Admitting: Nurse Practitioner

## 2020-03-26 VITALS — BP 132/80 | HR 81 | Temp 98.0°F | Ht 64.0 in | Wt 267.4 lb

## 2020-03-26 DIAGNOSIS — Z23 Encounter for immunization: Secondary | ICD-10-CM

## 2020-03-26 DIAGNOSIS — I10 Essential (primary) hypertension: Secondary | ICD-10-CM

## 2020-03-26 DIAGNOSIS — F319 Bipolar disorder, unspecified: Secondary | ICD-10-CM

## 2020-03-26 DIAGNOSIS — Z0000 Encounter for general adult medical examination without abnormal findings: Secondary | ICD-10-CM | POA: Diagnosis not present

## 2020-03-26 DIAGNOSIS — E559 Vitamin D deficiency, unspecified: Secondary | ICD-10-CM

## 2020-03-26 DIAGNOSIS — G4700 Insomnia, unspecified: Secondary | ICD-10-CM

## 2020-03-26 DIAGNOSIS — Z124 Encounter for screening for malignant neoplasm of cervix: Secondary | ICD-10-CM | POA: Insufficient documentation

## 2020-03-26 DIAGNOSIS — Z720 Tobacco use: Secondary | ICD-10-CM

## 2020-03-26 DIAGNOSIS — Z6841 Body Mass Index (BMI) 40.0 and over, adult: Secondary | ICD-10-CM

## 2020-03-26 DIAGNOSIS — R600 Localized edema: Secondary | ICD-10-CM

## 2020-03-26 DIAGNOSIS — E782 Mixed hyperlipidemia: Secondary | ICD-10-CM | POA: Diagnosis not present

## 2020-03-26 LAB — POCT URINALYSIS DIPSTICK
Bilirubin, UA: NEGATIVE
Blood, UA: NEGATIVE
Glucose, UA: NEGATIVE
Ketones, UA: NEGATIVE
Nitrite, UA: NEGATIVE
Protein, UA: NEGATIVE
Spec Grav, UA: 1.025 (ref 1.010–1.025)
Urobilinogen, UA: 0.2 E.U./dL
pH, UA: 7 (ref 5.0–8.0)

## 2020-03-26 LAB — POCT UA - MICROALBUMIN
Albumin/Creatinine Ratio, Urine, POC: 30
Creatinine, POC: 100 mg/dL
Microalbumin Ur, POC: 10 mg/L

## 2020-03-26 MED ORDER — SHINGRIX 50 MCG/0.5ML IM SUSR: 0.5000 mL | Freq: Once | INTRAMUSCULAR | 0 refills | Status: AC

## 2020-03-26 NOTE — Progress Notes (Signed)
This visit occurred during the SARS-CoV-2 public health emergency.  Safety protocols were in place, including screening questions prior to the visit, additional usage of staff PPE, and extensive cleaning of exam room while observing appropriate contact time as indicated for disinfecting solutions.  Subjective:     Patient ID: Madison Wells , female    DOB: [birth year] , 64 y.o.   MRN: [id number]   Chief Complaint  Patient presents with  . Annual Exam    HPI  Here for HM  Wt Readings from Last 3 Encounters: 03/26/20 : 267 lb 6.4 oz (121.3 kg) 10/17/19 : 260 lb 12.8 oz (118.3 kg) 09/24/19 : 264 lb 12.8 oz (120.1 kg)  She reports she has cut back.    She has been going to [facility] and she was advised to stop the Belsomra so she can get the extra 5 doses of pain medication.  Right hand carpal tunnel surgery last month, she is doing well. Her left hand will be done in upcoming months, has not been scheduled.     Past Medical History:  Diagnosis Date  . AKI (acute kidney injury) ([place]) 02/02/2017  . Angina   . Anxiety   . Bipolar 1 disorder ([place])   . Cervicalgia   . Chronic pain syndrome   . Diabetes mellitus without complication ([place])   . Disturbance of skin sensation   . Dizziness and giddiness   . Enthesopathy of hip region   . Headache   . Hypertension   . Hypokalemia 02/02/2017  . Lumbago   . Obesity   . Pain in joint, hand      Family History  Problem Relation Age of Onset  . Alcohol abuse Mother   . Alcohol abuse Father   . Alcohol abuse Sister   . Alcohol abuse Brother      Current Outpatient Medications:  .  alendronate (FOSAMAX) 70 MG tablet, TAKE 1 TABLET EVERY WEEK IN THE MORNING AT LEAST 30 MINS BEFORE FIRST FOOD/BEVERAGE/OR MEDS OF DAY, Disp: 12 tablet, Rfl: 2 .  aspirin EC 81 MG tablet, Take 81 mg by mouth daily., Disp: , Rfl:  .  buPROPion (WELLBUTRIN XL) 150 MG 24 hr tablet, TAKE 1 TABLET BY MOUTH EVERY DAY, Disp: 90 tablet, Rfl:  0 .  Calcium Carbonate-Vitamin D (CALCIUM 500 + D) 500-125 MG-UNIT TABS, Take 1 tablet by mouth daily at 12 noon., Disp: , Rfl:  .  cyclobenzaprine (FLEXERIL) 10 MG tablet, Take 1 tablet (10 mg total) by mouth 3 (three) times daily as needed for muscle spasms., Disp: 30 tablet, Rfl: 1 .  gabapentin (NEURONTIN) 300 MG capsule, TAKE 1 CAPSULE BY MOUTH THREE TIMES A DAY, Disp: 90 capsule, Rfl: 1 .  hydrochlorothiazide (HYDRODIURIL) 12.5 MG tablet, TAKE 1 TABLET BY MOUTH EVERY DAY, Disp: 90 tablet, Rfl: 1 .  ibuprofen (ADVIL,MOTRIN) 800 MG tablet, Take 800 mg by mouth 3 (three) times daily., Disp: , Rfl:  .  ketoconazole (NIZORAL) 2 % shampoo, Apply 1 application topically 2 (two) times a week. , Disp: , Rfl: 1 .  metFORMIN (GLUCOPHAGE) 500 MG tablet, TAKE 1 TABLET (500 MG TOTAL) BY MOUTH 2 (TWO) TIMES DAILY WITH A MEAL., Disp: 180 tablet, Rfl: 1 .  mometasone (ELOCON) 0.1 % cream, APPLY TO AFFECTED AREA A THIN LAYER DAILY, Disp: 60 g, Rfl: 2 .  Multiple Vitamins-Minerals (MULTIVITAMIN WITH MINERALS) tablet, Take 1 tablet by mouth daily., Disp: , Rfl:  .  oxybutynin (DITROPAN) 5 MG tablet,  TAKE 1 TABLET BY MOUTH EVERY DAY, Disp: 90 tablet, Rfl: 1 .  oxyCODONE HCl 15 MG TABA, Take 10 mg by mouth 4 (four) times daily. , Disp: , Rfl:  .  PARoxetine (PAXIL) 10 MG tablet, TAKE 1 TABLET BY MOUTH EVERY DAY, Disp: 90 tablet, Rfl: 1 .  phentermine 15 MG capsule, Take 15 mg by mouth daily., Disp: , Rfl:  .  potassium chloride SA (KLOR-CON M20) 20 MEQ tablet, TAKE 2 TABLETS BY MOUTH EVERY DAY, Disp: 180 tablet, Rfl: 0 .  spironolactone (ALDACTONE) 25 MG tablet, TAKE 1 TABLET BY MOUTH EVERY DAY, Disp: 90 tablet, Rfl: 1 .  atorvastatin (LIPITOR) 10 MG tablet, TAKE 1 TABLET BY MOUTH EVERY DAY (Patient not taking: Reported on 03/26/2020), Disp: 90 tablet, Rfl: 1 .  guaiFENesin (MUCINEX) 600 MG 12 hr tablet, Take 1 tablet (600 mg total) by mouth 2 (two) times daily. (Patient not taking: Reported on 10/17/2019), Disp: 60  tablet, Rfl: 2 .  Zoster Vaccine Adjuvanted [facility]) injection, Inject 0.5 mLs into the muscle once for 1 dose., Disp: 0.5 mL, Rfl: 0   No Known Allergies    The patient states she uses post menopausal status for birth control. Last LMP was Patient's last menstrual period was 08/13/2011.. Negative for Dysmenorrhea and Negative for Menorrhagia. She is scheduled to go for her Mammogram at the [facility].  Negative for: breast discharge, breast lump(s), breast pain and breast self exam. Associated symptoms include abnormal vaginal bleeding. Pertinent negatives include abnormal bleeding (hematology), anxiety, decreased libido, depression, difficulty falling sleep, dyspareunia, history of infertility, nocturia, sexual dysfunction, sleep disturbances, urinary incontinence, urinary urgency, vaginal discharge and vaginal itching. Diet regular. The patient states her exercise level is minimal - walking her dog.     The patient's tobacco use is:  Social History   Tobacco Use  Smoking Status Current Every Day Smoker  . Packs/day: 0.50  . Years: 40.00  . Pack years: 20.00  . Types: Cigarettes  Smokeless Tobacco Never Used   She has been exposed to passive smoke. The patient's alcohol use is:  Social History   Substance and Sexual Activity  Alcohol Use No  . Alcohol/week: 0.0 standard drinks   Additional information: Last pap 10/07/2016, next one scheduled for today.    Review of Systems  Constitutional: Negative.   HENT: Negative.   Eyes: Negative.   Respiratory: Negative.   Cardiovascular: Negative.  Negative for chest pain, palpitations and leg swelling.  Gastrointestinal: Negative.   Endocrine: Negative.   Genitourinary: Negative.   Musculoskeletal: Negative.   Skin: Negative.   Allergic/Immunologic: Negative.   Neurological: Negative.   Hematological: Negative.   Psychiatric/Behavioral: Negative.      Today's Vitals   03/26/20 0858  BP: 132/80  Pulse: 81  Temp: 98 F  (36.7 C)  Weight: 267 lb 6.4 oz (121.3 kg)  Height: 5\' 4"  (1.626 m)  PainSc: 0-No pain   Body mass index is 45.9 kg/m.   Objective:  Physical Exam Vitals reviewed.  Constitutional:      General: She is not in acute distress.    Appearance: Normal appearance. She is well-developed. She is obese.  HENT:     Head: Normocephalic and atraumatic.     Right Ear: Hearing, tympanic membrane, ear canal and external ear normal. There is no impacted cerumen.     Left Ear: Hearing, tympanic membrane, ear canal and external ear normal. There is no impacted cerumen.     Nose:  Comments: Deferred - masked    Mouth/Throat:     Comments: Deferred - masked Eyes:     General: Lids are normal.     Extraocular Movements: Extraocular movements intact.     Conjunctiva/sclera: Conjunctivae normal.     Pupils: Pupils are equal, round, and reactive to light.     Funduscopic exam:    Right eye: No papilledema.        Left eye: No papilledema.  Neck:     Thyroid: No thyroid mass.     Vascular: No carotid bruit.  Cardiovascular:     Rate and Rhythm: Normal rate and regular rhythm.     Pulses: Normal pulses.     Heart sounds: Normal heart sounds. No murmur heard.   Pulmonary:     Effort: Pulmonary effort is normal.     Breath sounds: Normal breath sounds.  Chest:     Chest wall: No mass.     Breasts: Tanner Score is 5.        Right: Normal. No mass or tenderness.        Left: Normal. No mass or tenderness.  Abdominal:     General: Abdomen is flat. Bowel sounds are normal. There is no distension.     Palpations: Abdomen is soft.     Tenderness: There is no abdominal tenderness.  Genitourinary:    Exam position: Lithotomy position.     Tanner stage (genital): 5.     Labia:        Right: No rash or tenderness.        Left: No rash or tenderness.      Vagina: Normal.     Cervix: Normal.     Uterus: Normal.      Adnexa: Right adnexa normal and left adnexa normal.       Right: No mass or  tenderness.         Left: No mass or tenderness.       Rectum: Guaiac result negative.  Musculoskeletal:        General: No swelling. Normal range of motion.     Cervical back: Full passive range of motion without pain, normal range of motion and neck supple.     Right lower leg: No edema.     Left lower leg: No edema.  Lymphadenopathy:     Upper Body:     Right upper body: No supraclavicular, axillary or pectoral adenopathy.     Left upper body: No supraclavicular, axillary or pectoral adenopathy.  Skin:    General: Skin is warm and dry.     Capillary Refill: Capillary refill takes less than 2 seconds.  Neurological:     General: No focal deficit present.     Mental Status: She is alert and oriented to person, place, and time.     Cranial Nerves: No cranial nerve deficit.     Sensory: No sensory deficit.  Psychiatric:        Mood and Affect: Mood normal.        Behavior: Behavior normal.        Thought Content: Thought content normal.        Judgment: Judgment normal.         Assessment And Plan:     1. Encounter for general adult medical examination w/o abnormal findings . Behavior modifications discussed and diet history reviewed.   . Pt will continue to exercise regularly and modify diet with low GI, plant based foods and decrease  intake of processed foods.  . Recommend intake of daily multivitamin, Vitamin D, and calcium.  . Recommend mammogram (she has an upcoming appt) for preventive screenings, as well as recommend immunizations that include influenza, TDAP, and shingles (she is encouraged to only get shingles vaccine if not sick and not within 2 weeks of any other vaccines)  2. Essential hypertension  Chronic, fair control  Continue with current medications - POCT Urinalysis Dipstick (81002) - POCT UA - Microalbumin - EKG 12-Lead  3. Mixed hyperlipidemia  Chronic, controlled  Continue current medications, tolerating well   4. Vitamin D deficiency  Will  check vitamin D level and supplement as needed.     Also encouraged to spend 15 minutes in the sun daily.  - Vitamin D (25 hydroxy)  5. Need for influenza vaccination  Influenza vaccine administered  Encouraged to take Tylenol as needed for fever or muscle aches. - Flu Vaccine QUAD 6+ mos PF IM (Fluarix Quad PF)  6. Insomnia, unspecified type  She will not continue with the Belsomra due to her pain management provider at [facility], I will not refill the Belsomra not wanting her to take in addition to her extra pain medication  7. Tobacco abuse She is not ready to quit smoking  Smoking cessation instruction/counseling given:  counseled patient on the dangers of tobacco use, advised patient to stop smoking, and reviewed strategies to maximize success   Will order CT scan chest low dose  - CT CHEST LUNG CA SCREEN LOW DOSE W/O CM; Future  8. Bipolar 1 disorder ([place])  She is not taking any medications for this at this time and is not being followed by psychiatry, she feels she is doing well.  9. Morbid obesity, unspecified obesity type ([place])  Chronic   Discussed increasing physical activity as tolerated  She had been taking phentermine given by her pain provider but has not taken since last year at the beginning of the pandemic  10. Encounter for immunization - Zoster Vaccine Adjuvanted [facility]) injection; Inject 0.5 mLs into the muscle once for 1 dose.  Dispense: 0.5 mL; Refill: 0  11. Encounter for Papanicolaou smear of cervix  PAP done no abnormal findings on physical exam - Cytology -Pap Smear  12. Lower extremity edema  She has been using lymphedema pumps a couple hours a day and using leg wraps daily to help with her swelling. Her leg edema is much better now at 1+ and not as red. She is needing a new prescription for the wraps which is generally every 6 months for a new pair. She gets them from  [facility] [phone]       Fax     [phone]   Patient was given opportunity to ask questions. Patient verbalized understanding of the plan and was able to repeat key elements of the plan. All questions were answered to their satisfaction.    Teola Bradley, FNP, have reviewed all documentation for this visit. The documentation on 03/26/20 for the exam, diagnosis, procedures, and orders are all accurate and complete.  THE PATIENT IS ENCOURAGED TO PRACTICE SOCIAL DISTANCING DUE TO THE COVID-19 PANDEMIC.

## 2020-03-26 NOTE — Patient Instructions (Addendum)
Health Maintenance, Female Adopting a healthy lifestyle and getting preventive care are important in promoting health and wellness. Ask your health care provider about:  The right schedule for you to have regular tests and exams.  Things you can do on your own to prevent diseases and keep yourself healthy. What should I know about diet, weight, and exercise? Eat a healthy diet   Eat a diet that includes plenty of vegetables, fruits, low-fat dairy products, and lean protein.  Do not eat a lot of foods that are high in solid fats, added sugars, or sodium. Maintain a healthy weight Body mass index (BMI) is used to identify weight problems. It estimates body fat based on height and weight. Your health care provider can help determine your BMI and help you achieve or maintain a healthy weight. Get regular exercise Get regular exercise. This is one of the most important things you can do for your health. Most adults should:  Exercise for at least 150 minutes each week. The exercise should increase your heart rate and make you sweat (moderate-intensity exercise).  Do strengthening exercises at least twice a week. This is in addition to the moderate-intensity exercise.  Spend less time sitting. Even light physical activity can be beneficial. Watch cholesterol and blood lipids Have your blood tested for lipids and cholesterol at 64 years of age, then have this test every 5 years. Have your cholesterol levels checked more often if:  Your lipid or cholesterol levels are high.  You are older than 64 years of age.  You are at high risk for heart disease. What should I know about cancer screening? Depending on your health history and family history, you may need to have cancer screening at various ages. This may include screening for:  Breast cancer.  Cervical cancer.  Colorectal cancer.  Skin cancer.  Lung cancer. What should I know about heart disease, diabetes, and high blood  pressure? Blood pressure and heart disease  High blood pressure causes heart disease and increases the risk of stroke. This is more likely to develop in people who have high blood pressure readings, are of African descent, or are overweight.  Have your blood pressure checked: ? Every 3-5 years if you are 18-39 years of age. ? Every year if you are 40 years old or older. Diabetes Have regular diabetes screenings. This checks your fasting blood sugar level. Have the screening done:  Once every three years after age 40 if you are at a normal weight and have a low risk for diabetes.  More often and at a younger age if you are overweight or have a high risk for diabetes. What should I know about preventing infection? Hepatitis B If you have a higher risk for hepatitis B, you should be screened for this virus. Talk with your health care provider to find out if you are at risk for hepatitis B infection. Hepatitis C Testing is recommended for:  Everyone born from 1945 through 1965.  Anyone with known risk factors for hepatitis C. Sexually transmitted infections (STIs)  Get screened for STIs, including gonorrhea and chlamydia, if: ? You are sexually active and are younger than 64 years of age. ? You are older than 64 years of age and your health care provider tells you that you are at risk for this type of infection. ? Your sexual activity has changed since you were last screened, and you are at increased risk for chlamydia or gonorrhea. Ask your health care provider if   you are at risk.  Ask your health care provider about whether you are at high risk for HIV. Your health care provider may recommend a prescription medicine to help prevent HIV infection. If you choose to take medicine to prevent HIV, you should first get tested for HIV. You should then be tested every 3 months for as long as you are taking the medicine. Pregnancy  If you are about to stop having your period (premenopausal) and  you may become pregnant, seek counseling before you get pregnant.  Take 400 to 800 micrograms (mcg) of folic acid every day if you become pregnant.  Ask for birth control (contraception) if you want to prevent pregnancy. Osteoporosis and menopause Osteoporosis is a disease in which the bones lose minerals and strength with aging. This can result in bone fractures. If you are 65 years old or older, or if you are at risk for osteoporosis and fractures, ask your health care provider if you should:  Be screened for bone loss.  Take a calcium or vitamin D supplement to lower your risk of fractures.  Be given hormone replacement therapy (HRT) to treat symptoms of menopause. Follow these instructions at home: Lifestyle  Do not use any products that contain nicotine or tobacco, such as cigarettes, e-cigarettes, and chewing tobacco. If you need help quitting, ask your health care provider.  Do not use street drugs.  Do not share needles.  Ask your health care provider for help if you need support or information about quitting drugs. Alcohol use  Do not drink alcohol if: ? Your health care provider tells you not to drink. ? You are pregnant, may be pregnant, or are planning to become pregnant.  If you drink alcohol: ? Limit how much you use to 0-1 drink a day. ? Limit intake if you are breastfeeding.  Be aware of how much alcohol is in your drink. In the [place] one drink equals one 12 oz bottle of beer (355 mL), one 5 oz glass of wine (148 mL), or one 1 oz glass of hard liquor (44 mL). General instructions  Schedule regular health, dental, and eye exams.  Stay current with your vaccines.  Tell your health care provider if: ? You often feel depressed. ? You have ever been abused or do not feel safe at home. Summary  Adopting a healthy lifestyle and getting preventive care are important in promoting health and wellness.  Follow your health care provider's instructions about healthy  diet, exercising, and getting tested or screened for diseases.  Follow your health care provider's instructions on monitoring your cholesterol and blood pressure. This information is not intended to replace advice given to you by your health care provider. Make sure you discuss any questions you have with your health care provider. Document Revised: 05/03/2018 Document Reviewed: 05/03/2018 Elsevier Patient Education  2020 [employer]  Exercising to Lose Weight Exercise is structured, repetitive physical activity to improve fitness and health. Getting regular exercise is important for everyone. It is especially important if you are overweight. Being overweight increases your risk of heart disease, stroke, diabetes, high blood pressure, and several types of cancer. Reducing your calorie intake and exercising can help you lose weight. Exercise is usually categorized as moderate or vigorous intensity. To lose weight, most people need to do a certain amount of moderate-intensity or vigorous-intensity exercise each week. Moderate-intensity exercise  Moderate-intensity exercise is any activity that gets you moving enough to burn at least three times more energy (calories)   than if you were sitting. Examples of moderate exercise include:  Walking a mile in 15 minutes.  Doing light yard work.  Biking at an easy pace. Most people should get at least 150 minutes (2 hours and 30 minutes) a week of moderate-intensity exercise to maintain their body weight. Vigorous-intensity exercise Vigorous-intensity exercise is any activity that gets you moving enough to burn at least six times more calories than if you were sitting. When you exercise at this intensity, you should be working hard enough that you are not able to carry on a conversation. Examples of vigorous exercise include:  Running.  Playing a team sport, such as football, basketball, and soccer.  Jumping rope. Most people should get at least  75 minutes (1 hour and 15 minutes) a week of vigorous-intensity exercise to maintain their body weight. How can exercise affect me? When you exercise enough to burn more calories than you eat, you lose weight. Exercise also reduces body fat and builds muscle. The more muscle you have, the more calories you burn. Exercise also:  Improves mood.  Reduces stress and tension.  Improves your overall fitness, flexibility, and endurance.  Increases bone strength. The amount of exercise you need to lose weight depends on:  Your age.  The type of exercise.  Any health conditions you have.  Your overall physical ability. Talk to your health care provider about how much exercise you need and what types of activities are safe for you. What actions can I take to lose weight? Nutrition   Make changes to your diet as told by your health care provider or diet and nutrition specialist (dietitian). This may include: ? Eating fewer calories. ? Eating more protein. ? Eating less unhealthy fats. ? Eating a diet that includes fresh fruits and vegetables, whole grains, low-fat dairy products, and lean protein. ? Avoiding foods with added fat, salt, and sugar.  Drink plenty of water while you exercise to prevent dehydration or heat stroke. Activity  Choose an activity that you enjoy and set realistic goals. Your health care provider can help you make an exercise plan that works for you.  Exercise at a moderate or vigorous intensity most days of the week. ? The intensity of exercise may vary from person to person. You can tell how intense a workout is for you by paying attention to your breathing and heartbeat. Most people will notice their breathing and heartbeat get faster with more intense exercise.  Do resistance training twice each week, such as: ? Push-ups. ? Sit-ups. ? Lifting weights. ? Using resistance bands.  Getting short amounts of exercise can be just as helpful as long structured  periods of exercise. If you have trouble finding time to exercise, try to include exercise in your daily routine. ? Get up, stretch, and walk around every 30 minutes throughout the day. ? Go for a walk during your lunch break. ? Park your car farther away from your destination. ? If you take public transportation, get off one stop early and walk the rest of the way. ? Make phone calls while standing up and walking around. ? Take the stairs instead of elevators or escalators.  Wear comfortable clothes and shoes with good support.  Do not exercise so much that you hurt yourself, feel dizzy, or get very short of breath. Where to find more information  [place] Department of Health and Human Services: www.hhs.gov  Centers for Disease Control and Prevention (CDC): www.cdc.gov Contact a health care provider:    Before starting a new exercise program.  If you have questions or concerns about your weight.  If you have a medical problem that keeps you from exercising. Get help right away if you have any of the following while exercising:  Injury.  Dizziness.  Difficulty breathing or shortness of breath that does not go away when you stop exercising.  Chest pain.  Rapid heartbeat. Summary  Being overweight increases your risk of heart disease, stroke, diabetes, high blood pressure, and several types of cancer.  Losing weight happens when you burn more calories than you eat.  Reducing the amount of calories you eat in addition to getting regular moderate or vigorous exercise each week helps you lose weight. This information is not intended to replace advice given to you by your health care provider. Make sure you discuss any questions you have with your health care provider. Document Revised: 05/23/2017 Document Reviewed: 05/23/2017 Elsevier Patient Education  2020 Elsevier Inc.   

## 2020-03-27 LAB — VITAMIN D 25 HYDROXY (VIT D DEFICIENCY, FRACTURES): Vit D, 25-Hydroxy: 49.9 ng/mL (ref 30.0–100.0)

## 2020-03-28 ENCOUNTER — Other Ambulatory Visit: Payer: Self-pay

## 2020-03-28 MED ORDER — BUPROPION HCL ER (XL) 150 MG PO TB24
150.0000 mg | ORAL_TABLET | Freq: Every day | ORAL | 0 refills | Status: DC
Start: 2020-03-28 — End: 2020-06-03

## 2020-03-28 MED ORDER — GABAPENTIN 300 MG PO CAPS
ORAL_CAPSULE | ORAL | 1 refills | Status: DC
Start: 2020-03-28 — End: 2020-05-30

## 2020-03-28 MED ORDER — PAROXETINE HCL 10 MG PO TABS
10.0000 mg | ORAL_TABLET | Freq: Every day | ORAL | 1 refills | Status: DC
Start: 2020-03-28 — End: 2020-11-18

## 2020-03-28 MED ORDER — OXYBUTYNIN CHLORIDE 5 MG PO TABS
5.0000 mg | ORAL_TABLET | Freq: Every day | ORAL | 1 refills | Status: DC
Start: 2020-03-28 — End: 2020-12-11

## 2020-03-28 MED ORDER — POTASSIUM CHLORIDE CRYS ER 20 MEQ PO TBCR
EXTENDED_RELEASE_TABLET | ORAL | 0 refills | Status: DC
Start: 2020-03-28 — End: 2020-08-25

## 2020-03-31 LAB — CYTOLOGY - PAP
Comment: NEGATIVE
Diagnosis: NEGATIVE
High risk HPV: NEGATIVE

## 2020-04-02 ENCOUNTER — Encounter: Payer: Self-pay | Admitting: Nurse Practitioner

## 2020-04-08 ENCOUNTER — Other Ambulatory Visit: Payer: Self-pay

## 2020-04-08 ENCOUNTER — Ambulatory Visit: Payer: Medicaid Other

## 2020-04-08 VITALS — BP 128/72 | HR 76 | Temp 98.1°F | Ht 64.0 in

## 2020-04-08 DIAGNOSIS — H6121 Impacted cerumen, right ear: Secondary | ICD-10-CM

## 2020-04-08 NOTE — Progress Notes (Signed)
Patient came today to have ears cleaned. Water lavage performed and ear curet was used as well. Left ear only.

## 2020-04-15 LAB — HM MAMMOGRAPHY

## 2020-04-30 ENCOUNTER — Encounter: Payer: Self-pay | Admitting: Nurse Practitioner

## 2020-05-01 ENCOUNTER — Encounter: Payer: Self-pay | Admitting: Nurse Practitioner

## 2020-05-07 ENCOUNTER — Encounter: Payer: Self-pay | Admitting: Nurse Practitioner

## 2020-05-07 NOTE — Telephone Encounter (Signed)
We can send a prescription for the nicoderm patches.    Yes, I have just reviewed them but we need to know why he drew these labs. Can you come in to sign a medical release form to [facility].  Okay about your booster and shingles.   Kindest Regards,   [name], DNP, FNP-BC

## 2020-05-12 ENCOUNTER — Other Ambulatory Visit: Payer: Self-pay | Admitting: Nurse Practitioner

## 2020-05-12 DIAGNOSIS — Z720 Tobacco use: Secondary | ICD-10-CM

## 2020-05-12 MED ORDER — NICOTINE 21 MG/24HR TD PT24: 21.0000 mg | MEDICATED_PATCH | TRANSDERMAL | 1 refills | Status: DC

## 2020-05-12 NOTE — Telephone Encounter (Signed)
I have sent them in.

## 2020-05-30 ENCOUNTER — Other Ambulatory Visit: Payer: Self-pay | Admitting: Nurse Practitioner

## 2020-06-03 ENCOUNTER — Other Ambulatory Visit: Payer: Self-pay | Admitting: Nurse Practitioner

## 2020-06-04 MED ORDER — BUPROPION HCL ER (XL) 150 MG PO TB24: 150.0000 mg | ORAL_TABLET | Freq: Every day | ORAL | 0 refills | Status: DC

## 2020-06-29 ENCOUNTER — Other Ambulatory Visit: Payer: Self-pay | Admitting: Nurse Practitioner

## 2020-07-19 ENCOUNTER — Other Ambulatory Visit: Payer: Self-pay | Admitting: Nurse Practitioner

## 2020-07-21 ENCOUNTER — Encounter: Payer: Self-pay | Admitting: Nurse Practitioner

## 2020-07-25 ENCOUNTER — Other Ambulatory Visit: Payer: Self-pay | Admitting: Nurse Practitioner

## 2020-07-29 ENCOUNTER — Telehealth: Payer: Self-pay

## 2020-07-29 NOTE — Telephone Encounter (Signed)
Patient notified that her handicap form has been completed. She said she will try to come pick it up tomorrow. YL,RMA

## 2020-08-04 ENCOUNTER — Encounter: Payer: Self-pay | Admitting: Nurse Practitioner

## 2020-08-06 ENCOUNTER — Other Ambulatory Visit: Payer: Self-pay

## 2020-08-06 ENCOUNTER — Ambulatory Visit (INDEPENDENT_AMBULATORY_CARE_PROVIDER_SITE_OTHER): Payer: Medicaid Other | Admitting: Nurse Practitioner

## 2020-08-06 ENCOUNTER — Other Ambulatory Visit: Payer: Self-pay | Admitting: Nurse Practitioner

## 2020-08-06 VITALS — BP 130/72 | HR 78 | Temp 98.1°F | Ht 64.0 in | Wt 267.2 lb

## 2020-08-06 DIAGNOSIS — H6121 Impacted cerumen, right ear: Secondary | ICD-10-CM | POA: Diagnosis not present

## 2020-08-06 NOTE — Progress Notes (Signed)
I,Tianna Badgett,acting as a Education administrator for [employer], NP.,have documented all relevant documentation on the behalf of [employer], NP,as directed by  Bary Castilla, NP while in the presence of Bary Castilla, NP.  This visit occurred during the SARS-CoV-2 public health emergency.  Safety protocols were in place, including screening questions prior to the visit, additional usage of staff PPE, and extensive cleaning of exam room while observing appropriate contact time as indicated for disinfecting solutions.  Subjective:     Patient ID: Madison Wells , female    DOB: 04/02/1956 , 65 y.o.   MRN: [id number]   Chief Complaint  Patient presents with  . Otalgia    HPI  Patient is here with wax built up in her right ear. She said she has had this problem in the past and when the wax is taken out then she is able to hear better. She does not use Q-tip. She tried to clean it out herself but couldn't. She is here to get her right ear cleaned out. No other concerns.     Past Medical History:  Diagnosis Date  . AKI (acute kidney injury) ([place]) 02/02/2017  . Angina   . Anxiety   . Bipolar 1 disorder ([place])   . Cervicalgia   . Chronic pain syndrome   . Diabetes mellitus without complication ([place])   . Disturbance of skin sensation   . Dizziness and giddiness   . Enthesopathy of hip region   . Headache   . Hypertension   . Hypokalemia 02/02/2017  . Lumbago   . Obesity   . Pain in joint, hand      Family History  Problem Relation Age of Onset  . Alcohol abuse Mother   . Alcohol abuse Father   . Alcohol abuse Sister   . Alcohol abuse Brother      Current Outpatient Medications:  .  alendronate (FOSAMAX) 70 MG tablet, TAKE 1 TABLET EVERY WEEK IN THE MORNING AT LEAST 30 MINS BEFORE FIRST FOOD/BEVERAGE/OR MEDS OF DAY, Disp: 12 tablet, Rfl: 2 .  aspirin EC 81 MG tablet, Take 81 mg by mouth daily., Disp: , Rfl:  .  atorvastatin (LIPITOR) 10 MG tablet, TAKE 1 TABLET BY  MOUTH EVERY DAY, Disp: 90 tablet, Rfl: 1 .  buPROPion (WELLBUTRIN XL) 150 MG 24 hr tablet, Take 1 tablet (150 mg total) by mouth daily., Disp: 90 tablet, Rfl: 0 .  Calcium Carbonate-Vitamin D (CALCIUM 500 + D) 500-125 MG-UNIT TABS, Take 1 tablet by mouth daily at 12 noon., Disp: , Rfl:  .  cyclobenzaprine (FLEXERIL) 10 MG tablet, Take 1 tablet (10 mg total) by mouth 3 (three) times daily as needed for muscle spasms., Disp: 30 tablet, Rfl: 1 .  gabapentin (NEURONTIN) 300 MG capsule, TAKE 1 CAPSULE BY MOUTH THREE TIMES A DAY, Disp: 90 capsule, Rfl: 1 .  hydrochlorothiazide (HYDRODIURIL) 12.5 MG tablet, TAKE 1 TABLET BY MOUTH EVERY DAY, Disp: 90 tablet, Rfl: 1 .  ibuprofen (ADVIL,MOTRIN) 800 MG tablet, Take 800 mg by mouth 3 (three) times daily., Disp: , Rfl:  .  ketoconazole (NIZORAL) 2 % shampoo, Apply 1 application topically 2 (two) times a week. , Disp: , Rfl: 1 .  metFORMIN (GLUCOPHAGE) 500 MG tablet, TAKE 1 TABLET (500 MG TOTAL) BY MOUTH 2 (TWO) TIMES DAILY WITH A MEAL., Disp: 180 tablet, Rfl: 1 .  mometasone (ELOCON) 0.1 % cream, APPLY TO AFFECTED AREA A THIN LAYER DAILY, Disp: 60 g, Rfl: 2 .  Multiple Vitamins-Minerals (  MULTIVITAMIN WITH MINERALS) tablet, Take 1 tablet by mouth daily., Disp: , Rfl:  .  nicotine (NICODERM CQ - DOSED IN MG/24 HOURS) 21 mg/24hr patch, Place 1 patch (21 mg total) onto the skin daily., Disp: 30 patch, Rfl: 1 .  oxybutynin (DITROPAN) 5 MG tablet, Take 1 tablet (5 mg total) by mouth daily., Disp: 90 tablet, Rfl: 1 .  oxyCODONE HCl 15 MG TABA, Take 10 mg by mouth 4 (four) times daily. , Disp: , Rfl:  .  PARoxetine (PAXIL) 10 MG tablet, Take 1 tablet (10 mg total) by mouth daily., Disp: 90 tablet, Rfl: 1 .  phentermine 15 MG capsule, Take 15 mg by mouth daily., Disp: , Rfl:  .  potassium chloride SA (KLOR-CON M20) 20 MEQ tablet, TAKE 2 TABLETS BY MOUTH EVERY DAY, Disp: 180 tablet, Rfl: 0 .  spironolactone (ALDACTONE) 25 MG tablet, TAKE 1 TABLET BY MOUTH EVERY DAY, Disp:  90 tablet, Rfl: 1   No Known Allergies   Review of Systems  Constitutional: Negative.  Negative for chills and fever.  HENT: Positive for ear pain and hearing loss. Negative for rhinorrhea and sinus pain.        Ear pain due to ear wax built up   Eyes: Negative for pain.  Respiratory: Negative.  Negative for shortness of breath and wheezing.   Cardiovascular: Negative.  Negative for chest pain and palpitations.  Gastrointestinal: Negative.   Neurological: Negative.      Today's Vitals   08/06/20 1120  BP: 130/72  Pulse: 78  Temp: 98.1 F (36.7 C)  TempSrc: Oral  Weight: 267 lb 3.2 oz (121.2 kg)  Height: 5\' 4"  (1.626 m)   Body mass index is 45.86 kg/m.   Objective:  Physical Exam Constitutional:      Appearance: Normal appearance. She is obese. She is not ill-appearing.  HENT:     Head: Normocephalic.     Right Ear: There is impacted cerumen.     Left Ear: Tympanic membrane, ear canal and external ear normal.  Cardiovascular:     Rate and Rhythm: Normal rate and regular rhythm.     Pulses: Normal pulses.     Heart sounds: Normal heart sounds. No murmur heard.          Assessment And Plan:     1. Impacted cerumen of right ear  -Patient is here to get her right ear cleaned.  -lavage and curette used.    Patient was given opportunity to ask questions. Patient verbalized understanding of the plan and was able to repeat key elements of the plan. All questions were answered to their satisfaction.  Bary Castilla, NP   I, Bary Castilla, NP, have reviewed all documentation for this visit. The documentation on 08/06/20 for the exam, diagnosis, procedures, and orders are all accurate and complete.   IF YOU HAVE BEEN REFERRED TO A SPECIALIST, IT MAY TAKE 1-2 WEEKS TO SCHEDULE/PROCESS THE REFERRAL. IF YOU HAVE NOT HEARD FROM US/SPECIALIST IN TWO WEEKS, PLEASE GIVE [place] A CALL AT [phone].   THE PATIENT IS ENCOURAGED TO PRACTICE SOCIAL DISTANCING DUE TO  THE COVID-19 PANDEMIC.

## 2020-08-06 NOTE — Patient Instructions (Signed)

## 2020-08-12 ENCOUNTER — Encounter: Payer: Self-pay | Admitting: Nurse Practitioner

## 2020-08-13 ENCOUNTER — Encounter: Payer: Self-pay | Admitting: Nurse Practitioner

## 2020-08-24 ENCOUNTER — Other Ambulatory Visit: Payer: Self-pay | Admitting: Nurse Practitioner

## 2020-08-24 DIAGNOSIS — R600 Localized edema: Secondary | ICD-10-CM

## 2020-08-24 DIAGNOSIS — Z720 Tobacco use: Secondary | ICD-10-CM

## 2020-09-10 ENCOUNTER — Encounter: Payer: Self-pay | Admitting: Nurse Practitioner

## 2020-09-24 ENCOUNTER — Ambulatory Visit: Payer: Medicaid Other | Admitting: Nurse Practitioner

## 2020-09-25 ENCOUNTER — Other Ambulatory Visit: Payer: Self-pay | Admitting: Nurse Practitioner

## 2020-09-26 NOTE — Telephone Encounter (Signed)
gabapentin

## 2020-10-07 ENCOUNTER — Encounter: Payer: Self-pay | Admitting: Nurse Practitioner

## 2020-10-07 ENCOUNTER — Ambulatory Visit (INDEPENDENT_AMBULATORY_CARE_PROVIDER_SITE_OTHER): Payer: Medicaid Other | Admitting: Nurse Practitioner

## 2020-10-07 ENCOUNTER — Other Ambulatory Visit: Payer: Self-pay

## 2020-10-07 VITALS — BP 124/80 | HR 96 | Temp 98.7°F | Ht 64.0 in | Wt 253.8 lb

## 2020-10-07 DIAGNOSIS — D229 Melanocytic nevi, unspecified: Secondary | ICD-10-CM

## 2020-10-07 DIAGNOSIS — E782 Mixed hyperlipidemia: Secondary | ICD-10-CM | POA: Diagnosis not present

## 2020-10-07 DIAGNOSIS — I10 Essential (primary) hypertension: Secondary | ICD-10-CM

## 2020-10-07 DIAGNOSIS — F319 Bipolar disorder, unspecified: Secondary | ICD-10-CM

## 2020-10-07 DIAGNOSIS — R7309 Other abnormal glucose: Secondary | ICD-10-CM

## 2020-10-07 DIAGNOSIS — Z6841 Body Mass Index (BMI) 40.0 and over, adult: Secondary | ICD-10-CM

## 2020-10-07 DIAGNOSIS — E559 Vitamin D deficiency, unspecified: Secondary | ICD-10-CM | POA: Diagnosis not present

## 2020-10-07 DIAGNOSIS — F17210 Nicotine dependence, cigarettes, uncomplicated: Secondary | ICD-10-CM

## 2020-10-07 NOTE — Patient Instructions (Signed)

## 2020-10-07 NOTE — Progress Notes (Signed)
I, ,acting as a scribe for  , FNP.,have documented all relevant documentation on the behalf of  , FNP,as directed by   , FNP while in the presence of  , FNP. This visit occurred during the SARS-CoV-2 public health emergency.  Safety protocols were in place, including screening questions prior to the visit, additional usage of staff PPE, and extensive cleaning of exam room while observing appropriate contact time as indicated for disinfecting solutions.  Subjective:     Patient ID: Madison Wells , female    DOB: 02/22/1956 , 65 y.o.   MRN: [id number]   Chief Complaint  Patient presents with  . Hypertension    HPI  Pt is here for a BPC. Pt has a spot on her right thigh that has been bothering her for about a couple of weeks, she experiences itchiness with it being hard as well. 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   [facility] to her back.  She has taken medications in the past for bipolar but has not taken any in quite some time, she manages on her own and feels she is stable.   Hypertension This is a chronic problem. The current episode started more than 1 year ago. The problem is controlled. Pertinent negatives include no anxiety, blurred vision, chest pain, palpitations or peripheral edema. There are no associated agents to hypertension. Risk factors for coronary artery disease include obesity, sedentary lifestyle and smoking/tobacco exposure. The current treatment provides moderate improvement. There are no compliance problems.  There is no history of angina. There is no history of chronic renal disease.  Diabetes She presents for her follow-up diabetic visit. She has type 2 diabetes mellitus. Her disease course has been stable. Pertinent negatives for diabetes include no blurred vision and no chest pain. There are no hypoglycemic complications. There are no diabetic  complications. Risk factors for coronary artery disease include obesity and sedentary lifestyle. Current diabetic treatment includes oral agent (monotherapy). She is compliant with treatment most of the time. There is no change in her home blood glucose trend. (She is not checking her blood sugar )     Past Medical History:  Diagnosis Date  . AKI (acute kidney injury) (HCC) 02/02/2017  . Angina   . Anxiety   . Bipolar 1 disorder (HCC)   . Cervicalgia   . Chronic pain syndrome   . Diabetes mellitus without complication (HCC)   . Disturbance of skin sensation   . Dizziness and giddiness   . Enthesopathy of hip region   . Headache   . Hypertension   . Hypokalemia 02/02/2017  . Lumbago   . Obesity   . Pain in joint, hand      Family History  Problem Relation Age of Onset  . Alcohol abuse Mother   . Alcohol abuse Father   . Alcohol abuse Sister   . Alcohol abuse Brother      Current Outpatient Medications:  .  alendronate (FOSAMAX) 70 MG tablet, TAKE 1 TABLET EVERY WEEK IN THE MORNING AT LEAST 30 MINS BEFORE FIRST FOOD/BEVERAGE/OR MEDS OF DAY, Disp: 12 tablet, Rfl: 2 .  aspirin EC 81 MG tablet, Take 81 mg by mouth daily., Disp: , Rfl:  .  atorvastatin (LIPITOR) 10 MG tablet, TAKE 1 TABLET BY MOUTH EVERY DAY, Disp: 90 tablet, Rfl: 1 .  buPROPion (WELLBUTRIN XL) 150 MG 24 hr tablet, Take 1 tablet (150 mg total) by mouth   daily., Disp: 90 tablet, Rfl: 0 .  Calcium Carbonate-Vitamin D (CALCIUM 500 + D) 500-125 MG-UNIT TABS, Take 1 tablet by mouth daily at 12 noon., Disp: , Rfl:  .  cyclobenzaprine (FLEXERIL) 10 MG tablet, Take 1 tablet (10 mg total) by mouth 3 (three) times daily as needed for muscle spasms., Disp: 30 tablet, Rfl: 1 .  gabapentin (NEURONTIN) 300 MG capsule, TAKE 1 CAPSULE BY MOUTH THREE TIMES A DAY, Disp: 90 capsule, Rfl: 1 .  hydrochlorothiazide (HYDRODIURIL) 12.5 MG tablet, TAKE 1 TABLET BY MOUTH EVERY DAY, Disp: 90 tablet, Rfl: 1 .  ibuprofen (ADVIL,MOTRIN) 800 MG  tablet, Take 800 mg by mouth 3 (three) times daily., Disp: , Rfl:  .  ketoconazole (NIZORAL) 2 % shampoo, Apply 1 application topically 2 (two) times a week. , Disp: , Rfl: 1 .  metFORMIN (GLUCOPHAGE) 500 MG tablet, TAKE 1 TABLET (500 MG TOTAL) BY MOUTH 2 (TWO) TIMES DAILY WITH A MEAL., Disp: 180 tablet, Rfl: 1 .  mometasone (ELOCON) 0.1 % cream, APPLY TO AFFECTED AREA A THIN LAYER DAILY, Disp: 60 g, Rfl: 2 .  Multiple Vitamins-Minerals (MULTIVITAMIN WITH MINERALS) tablet, Take 1 tablet by mouth daily., Disp: , Rfl:  .  nicotine (NICODERM CQ - DOSED IN MG/24 HOURS) 21 mg/24hr patch, PLACE 1 PATCH ONTO THE SKIN DAILY., Disp: 28 patch, Rfl: 1 .  oxybutynin (DITROPAN) 5 MG tablet, Take 1 tablet (5 mg total) by mouth daily., Disp: 90 tablet, Rfl: 1 .  oxyCODONE HCl 15 MG TABA, Take 10 mg by mouth 4 (four) times daily. , Disp: , Rfl:  .  PARoxetine (PAXIL) 10 MG tablet, Take 1 tablet (10 mg total) by mouth daily., Disp: 90 tablet, Rfl: 1 .  phentermine 15 MG capsule, Take 15 mg by mouth daily., Disp: , Rfl:  .  potassium chloride SA (KLOR-CON M20) 20 MEQ tablet, TAKE 2 TABLETS BY MOUTH EVERY DAY, Disp: 180 tablet, Rfl: 0 .  spironolactone (ALDACTONE) 25 MG tablet, TAKE 1 TABLET BY MOUTH EVERY DAY, Disp: 90 tablet, Rfl: 1   No Known Allergies   Review of Systems  Constitutional: Negative.   Eyes: Negative for blurred vision.  Respiratory: Negative.   Cardiovascular: Negative.  Negative for chest pain and palpitations.  Skin: Positive for rash (small bump right thigh).  Neurological: Negative.   Psychiatric/Behavioral: Negative.      Today's Vitals   10/07/20 0850  BP: 124/80  Pulse: 96  Temp: 98.7 F (37.1 C)  Weight: 253 lb 12.8 oz (115.1 kg)  Height: 5' 4" (1.626 m)   Body mass index is 43.56 kg/m.  Wt Readings from Last 3 Encounters:  10/07/20 253 lb 12.8 oz (115.1 kg)  08/06/20 267 lb 3.2 oz (121.2 kg)  03/26/20 267 lb 6.4 oz (121.3 kg)   Objective:  Physical Exam Vitals  reviewed.  Constitutional:      General: She is not in acute distress.    Appearance: Normal appearance. She is well-developed. She is obese.  Cardiovascular:     Rate and Rhythm: Normal rate and regular rhythm.     Pulses: Normal pulses.     Heart sounds: Normal heart sounds. No murmur heard.   Pulmonary:     Effort: Pulmonary effort is normal. No respiratory distress.     Breath sounds: Normal breath sounds. No wheezing.  Abdominal:     General: Bowel sounds are normal.     Palpations: Abdomen is soft.  Musculoskeletal:  General: Normal range of motion.  Skin:    General: Skin is warm and dry.     Capillary Refill: Capillary refill takes less than 2 seconds.     Findings: No erythema.     Comments: Right lateral thigh with atypical nevi x 2, one area has slightly white center and irregular and more anterior nevi is slightly erythematous around area of irregularity  Neurological:     General: No focal deficit present.     Mental Status: She is alert and oriented to person, place, and time.     Cranial Nerves: No cranial nerve deficit.     Motor: No weakness.  Psychiatric:        Mood and Affect: Mood normal.        Behavior: Behavior normal.        Thought Content: Thought content normal.        Judgment: Judgment normal.         Assessment And Plan:     1. Essential hypertension  Chronic, well controlled  Continue with current medications  2. Mixed hyperlipidemia  Chronic, controlled  Continue with current medications, tolerating well - Lipid panel - CMP14+EGFR  3. Vitamin D deficiency  Will check vitamin D level and supplement as needed.     Also encouraged to spend 15 minutes in the sun daily.   4. Abnormal glucose  Chronic, stable  She has had a HgbA1c of 6.5 in the past   Diabetic foot exam done with decreased sensation aware to check feet regularly and avoid walking barefoot - Hemoglobin A1c  5. Class 3 severe obesity due to excess  calories with body mass index (BMI) of 40.0 to 44.9 in adult, unspecified whether serious comorbidity present (HCC)  Chronic  Discussed healthy diet and regular exercise options   Continue walking while at work  6. Bipolar 1 disorder (HCC)  Chronic, controlled without medications  7. Atypical nevi  Left lateral thigh with two atypical nevi  Will refer to Dermatology for further evaluation - Ambulatory referral to Dermatology  8. Cigarette smoker  She has a 20 PPY history of smoking continues to smoke at least 6 cigarettes a day - CT CHEST LUNG CA SCREEN LOW DOSE W/O CM; Future     Patient was given opportunity to ask questions. Patient verbalized understanding of the plan and was able to repeat key elements of the plan. All questions were answered to their satisfaction.   , FNP   I,  , FNP, have reviewed all documentation for this visit. The documentation on 10/07/20 for the exam, diagnosis, procedures, and orders are all accurate and complete.   IF YOU HAVE BEEN REFERRED TO A SPECIALIST, IT MAY TAKE 1-2 WEEKS TO SCHEDULE/PROCESS THE REFERRAL. IF YOU HAVE NOT HEARD FROM US/SPECIALIST IN TWO WEEKS, PLEASE GIVE US A CALL AT [phone].   THE PATIENT IS ENCOURAGED TO PRACTICE SOCIAL DISTANCING DUE TO THE COVID-19 PANDEMIC.   

## 2020-10-08 LAB — CMP14+EGFR
ALT: 17 IU/L (ref 0–32)
AST: 17 IU/L (ref 0–40)
Albumin/Globulin Ratio: 1.6 (ref 1.2–2.2)
Albumin: 4 g/dL (ref 3.8–4.8)
Alkaline Phosphatase: 87 IU/L (ref 44–121)
BUN/Creatinine Ratio: 14 (ref 12–28)
BUN: 9 mg/dL (ref 8–27)
Bilirubin Total: 0.2 mg/dL (ref 0.0–1.2)
CO2: 26 mmol/L (ref 20–29)
Calcium: 9.4 mg/dL (ref 8.7–10.3)
Chloride: 102 mmol/L (ref 96–106)
Creatinine, Ser: 0.66 mg/dL (ref 0.57–1.00)
Globulin, Total: 2.5 g/dL (ref 1.5–4.5)
Glucose: 97 mg/dL (ref 65–99)
Potassium: 4.3 mmol/L (ref 3.5–5.2)
Sodium: 142 mmol/L (ref 134–144)
Total Protein: 6.5 g/dL (ref 6.0–8.5)
eGFR: 98 mL/min/{1.73_m2} (ref >59)

## 2020-10-08 LAB — LIPID PANEL
Chol/HDL Ratio: 2 ratio (ref 0.0–4.4)
Cholesterol, Total: 113 mg/dL (ref 100–199)
HDL: 56 mg/dL (ref >39)
LDL Chol Calc (NIH): 39 mg/dL (ref 0–99)
Triglycerides: 93 mg/dL (ref 0–149)
VLDL Cholesterol Cal: 18 mg/dL (ref 5–40)

## 2020-10-08 LAB — HEMOGLOBIN A1C
Est. average glucose Bld gHb Est-mCnc: 126 mg/dL
Hgb A1c MFr Bld: 6 % — ABNORMAL HIGH (ref 4.8–5.6)

## 2020-11-13 ENCOUNTER — Other Ambulatory Visit: Payer: Self-pay

## 2020-11-13 ENCOUNTER — Encounter: Payer: Self-pay | Admitting: Nurse Practitioner

## 2020-11-13 MED ORDER — KETOCONAZOLE 2 % EX SHAM: 1.0000 "application " | MEDICATED_SHAMPOO | CUTANEOUS | 1 refills | Status: AC

## 2020-11-14 ENCOUNTER — Inpatient Hospital Stay: Admission: RE | Admit: 2020-11-14 | Payer: Medicaid Other | Source: Ambulatory Visit

## 2020-11-14 ENCOUNTER — Encounter: Payer: Self-pay | Admitting: Nurse Practitioner

## 2020-11-14 ENCOUNTER — Other Ambulatory Visit: Payer: Self-pay | Admitting: Nurse Practitioner

## 2020-11-18 ENCOUNTER — Other Ambulatory Visit: Payer: Self-pay | Admitting: Nurse Practitioner

## 2020-11-21 ENCOUNTER — Other Ambulatory Visit: Payer: Self-pay | Admitting: Nurse Practitioner

## 2020-12-06 ENCOUNTER — Other Ambulatory Visit: Payer: Self-pay | Admitting: Nurse Practitioner

## 2020-12-08 ENCOUNTER — Ambulatory Visit
Admission: RE | Admit: 2020-12-08 | Discharge: 2020-12-08 | Disposition: A | Discharge status: Home / Self Care | Payer: Medicaid Other | Source: Ambulatory Visit | Attending: Nurse Practitioner | Admitting: Nurse Practitioner

## 2020-12-08 DIAGNOSIS — F17210 Nicotine dependence, cigarettes, uncomplicated: Secondary | ICD-10-CM

## 2020-12-10 ENCOUNTER — Other Ambulatory Visit: Payer: Self-pay | Admitting: Nurse Practitioner

## 2021-01-09 ENCOUNTER — Other Ambulatory Visit: Payer: Self-pay | Admitting: Nurse Practitioner

## 2021-01-23 ENCOUNTER — Other Ambulatory Visit: Payer: Self-pay | Admitting: Nurse Practitioner

## 2021-02-04 LAB — HM DIABETES EYE EXAM

## 2021-02-05 ENCOUNTER — Encounter: Payer: Self-pay | Admitting: Nurse Practitioner

## 2021-02-23 ENCOUNTER — Other Ambulatory Visit: Payer: Self-pay | Admitting: Nurse Practitioner

## 2021-02-23 DIAGNOSIS — R600 Localized edema: Secondary | ICD-10-CM

## 2021-03-24 ENCOUNTER — Other Ambulatory Visit: Payer: Self-pay | Admitting: Nurse Practitioner

## 2021-04-01 ENCOUNTER — Encounter: Payer: Medicaid Other | Admitting: Nurse Practitioner

## 2021-04-20 LAB — HM MAMMOGRAPHY

## 2021-04-23 ENCOUNTER — Encounter: Payer: Self-pay | Admitting: Nurse Practitioner

## 2021-05-18 ENCOUNTER — Other Ambulatory Visit: Payer: Self-pay | Admitting: Nurse Practitioner

## 2021-05-21 ENCOUNTER — Encounter: Payer: Self-pay | Admitting: Nurse Practitioner

## 2021-05-21 ENCOUNTER — Ambulatory Visit (INDEPENDENT_AMBULATORY_CARE_PROVIDER_SITE_OTHER): Payer: Medicare (Managed Care) | Admitting: Nurse Practitioner

## 2021-05-21 ENCOUNTER — Other Ambulatory Visit: Payer: Self-pay

## 2021-05-21 VITALS — BP 132/80 | HR 65 | Temp 98.1°F | Ht 63.8 in | Wt 236.4 lb

## 2021-05-21 DIAGNOSIS — Z23 Encounter for immunization: Secondary | ICD-10-CM

## 2021-05-21 DIAGNOSIS — R7309 Other abnormal glucose: Secondary | ICD-10-CM | POA: Diagnosis not present

## 2021-05-21 DIAGNOSIS — Z1211 Encounter for screening for malignant neoplasm of colon: Secondary | ICD-10-CM

## 2021-05-21 DIAGNOSIS — M25571 Pain in right ankle and joints of right foot: Secondary | ICD-10-CM

## 2021-05-21 DIAGNOSIS — I10 Essential (primary) hypertension: Secondary | ICD-10-CM

## 2021-05-21 DIAGNOSIS — E782 Mixed hyperlipidemia: Secondary | ICD-10-CM | POA: Diagnosis not present

## 2021-05-21 DIAGNOSIS — Z6841 Body Mass Index (BMI) 40.0 and over, adult: Secondary | ICD-10-CM

## 2021-05-21 DIAGNOSIS — E2839 Other primary ovarian failure: Secondary | ICD-10-CM

## 2021-05-21 DIAGNOSIS — F319 Bipolar disorder, unspecified: Secondary | ICD-10-CM

## 2021-05-21 MED ORDER — PNEUMOCOCCAL 13-VAL CONJ VACC IM SUSP: 0.5000 mL | INTRAMUSCULAR | 0 refills | Status: AC

## 2021-05-21 MED ORDER — ZOSTER VAC RECOMB ADJUVANTED 50 MCG/0.5ML IM SUSR: 0.5000 mL | Freq: Once | INTRAMUSCULAR | 0 refills | Status: AC

## 2021-05-21 NOTE — Progress Notes (Signed)
I,Yamilka J Llittleton,acting as a Education administrator for [employer], FNP.,have documented all relevant documentation on the behalf of [name], FNP,as directed by  [name], FNP while in the presence of [name], [place].   This visit occurred during the SARS-CoV-2 public health emergency.  Safety protocols were in place, including screening questions prior to the visit, additional usage of staff PPE, and extensive cleaning of exam room while observing appropriate contact time as indicated for disinfecting solutions.  Subjective:     Patient ID: Madison Wells , female    DOB: [birth year] , 65 y.o.   MRN: [id number]   Chief Complaint  Patient presents with   Hypertension    HPI  Patient presents today for a blood pressure f/u. She would also like her potassium to be checked.   Wt Readings from Last 3 Encounters: 05/21/21 : 236 lb 6.4 oz (107.2 kg) 10/07/20 : 253 lb 12.8 oz (115.1 kg) 08/06/20 : 267 lb 3.2 oz (121.2 kg)  She is back at work due to her rent increasing so her pain medications have increased. She is working in [employer] (medical).     Hypertension This is a chronic problem. The current episode started more than 1 year ago. The problem is controlled. Pertinent negatives include no anxiety, blurred vision, chest pain, headaches, palpitations or peripheral edema. There are no associated agents to hypertension. Risk factors for coronary artery disease include obesity, sedentary lifestyle and smoking/tobacco exposure. The current treatment provides moderate improvement. There are no compliance problems.  There is no history of angina. There is no history of chronic renal disease.  Fall The accident occurred More than 1 week ago (2 weeks ago). Fall occurred: while walking the dog. The pain is mild (right leg). Pertinent negatives include no abdominal pain or headaches. She has tried nothing for the symptoms.    Past Medical History:  Diagnosis Date   AKI (acute kidney injury) ([place])  02/02/2017   Angina    Anxiety    Bipolar 1 disorder ([place])    Cervicalgia    Chronic pain syndrome    Diabetes mellitus without complication (HCC)    Disturbance of skin sensation    Dizziness and giddiness    Enthesopathy of hip region    Headache    Hypertension    Hypokalemia 02/02/2017   Lumbago    Obesity    Pain in joint, hand      Family History  Problem Relation Age of Onset   Alcohol abuse Mother    Alcohol abuse Father    Alcohol abuse Sister    Alcohol abuse Brother      Current Outpatient Medications:    alendronate (FOSAMAX) 70 MG tablet, TAKE 1 TABLET EVERY WEEK IN THE MORNING AT LEAST 30 MINS BEFORE FIRST FOOD/BEVERAGE/OR MEDS OF DAY, Disp: 12 tablet, Rfl: 2   aspirin EC 81 MG tablet, Take 81 mg by mouth daily., Disp: , Rfl:    atorvastatin (LIPITOR) 10 MG tablet, TAKE 1 TABLET BY MOUTH EVERY DAY, Disp: 90 tablet, Rfl: 1   buPROPion (WELLBUTRIN XL) 150 MG 24 hr tablet, TAKE 1 TABLET BY MOUTH EVERY DAY, Disp: 90 tablet, Rfl: 0   Calcium Carbonate-Vitamin D (CALCIUM 500 + D) 500-125 MG-UNIT TABS, Take 1 tablet by mouth daily at 12 noon., Disp: , Rfl:    cyclobenzaprine (FLEXERIL) 10 MG tablet, Take 1 tablet (10 mg total) by mouth 3 (three) times daily as needed for muscle spasms., Disp: 30 tablet, Rfl: 1  gabapentin (NEURONTIN) 300 MG capsule, TAKE 1 CAPSULE BY MOUTH THREE TIMES A DAY, Disp: 90 capsule, Rfl: 1   hydrochlorothiazide (HYDRODIURIL) 12.5 MG tablet, TAKE 1 TABLET BY MOUTH EVERY DAY, Disp: 90 tablet, Rfl: 1   ibuprofen (ADVIL,MOTRIN) 800 MG tablet, Take 800 mg by mouth 3 (three) times daily., Disp: , Rfl:    ketoconazole (NIZORAL) 2 % shampoo, Apply 1 application topically 2 (two) times a week., Disp: 120 mL, Rfl: 1   metFORMIN (GLUCOPHAGE) 500 MG tablet, TAKE 1 TABLET BY MOUTH 2 TIMES DAILY WITH A MEAL., Disp: 180 tablet, Rfl: 1   mometasone (ELOCON) 0.1 % cream, APPLY TO AFFECTED AREA A THIN LAYER DAILY, Disp: 60 g, Rfl: 2   Multiple Vitamins-Minerals  (MULTIVITAMIN WITH MINERALS) tablet, Take 1 tablet by mouth daily., Disp: , Rfl:    nicotine (NICODERM CQ - DOSED IN MG/24 HOURS) 21 mg/24hr patch, PLACE 1 PATCH ONTO THE SKIN DAILY., Disp: 28 patch, Rfl: 1   oxybutynin (DITROPAN) 5 MG tablet, TAKE 1 TABLET BY MOUTH EVERY DAY, Disp: 90 tablet, Rfl: 1   oxyCODONE HCl 15 MG TABA, Take 10 mg by mouth 4 (four) times daily. , Disp: , Rfl:    PARoxetine (PAXIL) 10 MG tablet, TAKE 1 TABLET BY MOUTH EVERY DAY, Disp: 90 tablet, Rfl: 1   phentermine 15 MG capsule, Take 15 mg by mouth daily., Disp: , Rfl:    pneumococcal 13-valent conjugate vaccine (PREVNAR 13) SUSP injection, Inject 0.5 mLs into the muscle tomorrow at 10 am for 1 dose., Disp: 0.5 mL, Rfl: 0   potassium chloride SA (KLOR-CON M20) 20 MEQ tablet, TAKE 2 TABLETS BY MOUTH EVERY DAY, Disp: 180 tablet, Rfl: 0   spironolactone (ALDACTONE) 25 MG tablet, TAKE 1 TABLET BY MOUTH EVERY DAY, Disp: 90 tablet, Rfl: 1   Zoster Vaccine Adjuvanted (SHINGRIX) injection, Inject 0.5 mLs into the muscle once for 1 dose., Disp: 0.5 mL, Rfl: 0   No Known Allergies   Review of Systems  Constitutional: Negative.   Eyes:  Negative for blurred vision.  Respiratory: Negative.    Cardiovascular:  Negative for chest pain and palpitations.  Gastrointestinal:  Negative for abdominal pain.  Musculoskeletal:        Right ankle pain and bulging area to posterior ankle  Neurological:  Negative for dizziness and headaches.  Psychiatric/Behavioral: Negative.      Today's Vitals   05/21/21 1647  BP: 132/80  Pulse: 65  Temp: 98.1 F (36.7 C)  Weight: 236 lb 6.4 oz (107.2 kg)  Height: 5' 3.8" (1.621 m)  PainSc: 8    Body mass index is 40.83 kg/m.   Objective:  Physical Exam Vitals reviewed.  Constitutional:      General: She is not in acute distress.    Appearance: Normal appearance. She is obese.  Cardiovascular:     Rate and Rhythm: Normal rate and regular rhythm.     Pulses: Normal pulses.     Heart  sounds: Normal heart sounds. No murmur heard. Pulmonary:     Effort: Pulmonary effort is normal. No respiratory distress.     Breath sounds: Normal breath sounds. No wheezing.  Musculoskeletal:        General: No swelling or tenderness.     Comments: Right ankle posterior with bulging area  Neurological:     General: No focal deficit present.     Mental Status: She is alert and oriented to person, place, and time.     Cranial Nerves: No  cranial nerve deficit.     Motor: No weakness.  Psychiatric:        Mood and Affect: Mood normal.        Behavior: Behavior normal.        Thought Content: Thought content normal.        Judgment: Judgment normal.        Assessment And Plan:     1. Essential hypertension Comments: Blood pressure is fairly controlled, continue current medications - POCT UA - Microalbumin  2. Mixed hyperlipidemia Comments: Stable, tolerating statin well.   3. Abnormal glucose Comments: Stable, continue focusing on healthy diet - Hemoglobin A1c  4. Acute right ankle pain Comments: Pain with going up stairs, has bulging area to posterior ankls - DG Ankle Complete Right; Future  5. Bipolar 1 disorder ([place]) Comments: No current medications and not being followed by Behavioral health, feels this is under good control  6. Colon cancer screening - Cologuard  7. Class 3 severe obesity due to excess calories with body mass index (BMI) of 40.0 to 44.9 in adult, unspecified whether serious comorbidity present (HCC) Chronic Discussed healthy diet and regular exercise options  Encouraged to exercise at least 150 minutes per week with 2 days of strength training  8. Decreased estrogen level - DG Bone Density; Future  9. Immunization due Influenza vaccine administered Encouraged to take Tylenol as needed for fever or muscle aches. - Flu Vaccine QUAD High Dose(Fluad) - Zoster Vaccine Adjuvanted [facility]) injection; Inject 0.5 mLs into the muscle once for 1 dose.   Dispense: 0.5 mL; Refill: 0 - pneumococcal 13-valent conjugate vaccine (PREVNAR 13) SUSP injection; Inject 0.5 mLs into the muscle tomorrow at 10 am for 1 dose.  Dispense: 0.5 mL; Refill: 0     Patient was given opportunity to ask questions. Patient verbalized understanding of the plan and was able to repeat key elements of the plan. All questions were answered to their satisfaction.  [name], FNP   I, [name], FNP, have reviewed all documentation for this visit. The documentation on 05/21/21 for the exam, diagnosis, procedures, and orders are all accurate and complete.   IF YOU HAVE BEEN REFERRED TO A SPECIALIST, IT MAY TAKE 1-2 WEEKS TO SCHEDULE/PROCESS THE REFERRAL. IF YOU HAVE NOT HEARD FROM US/SPECIALIST IN TWO WEEKS, PLEASE GIVE [place] A CALL AT [phone].   THE PATIENT IS ENCOURAGED TO PRACTICE SOCIAL DISTANCING DUE TO THE COVID-19 PANDEMIC.

## 2021-05-21 NOTE — Patient Instructions (Signed)

## 2021-05-22 LAB — HEMOGLOBIN A1C
Est. average glucose Bld gHb Est-mCnc: 117 mg/dL
Hgb A1c MFr Bld: 5.7 % — ABNORMAL HIGH (ref 4.8–5.6)

## 2021-05-26 ENCOUNTER — Encounter: Payer: Self-pay | Admitting: Nurse Practitioner

## 2021-05-27 ENCOUNTER — Other Ambulatory Visit: Payer: Self-pay | Admitting: Nurse Practitioner

## 2021-05-27 DIAGNOSIS — I10 Essential (primary) hypertension: Secondary | ICD-10-CM

## 2021-05-29 ENCOUNTER — Other Ambulatory Visit: Payer: Self-pay | Admitting: Nurse Practitioner

## 2021-06-01 ENCOUNTER — Other Ambulatory Visit: Payer: Self-pay | Admitting: Nurse Practitioner

## 2021-06-01 ENCOUNTER — Ambulatory Visit
Admission: RE | Admit: 2021-06-01 | Discharge: 2021-06-01 | Disposition: A | Discharge status: Home / Self Care | Payer: Medicare (Managed Care) | Source: Ambulatory Visit | Attending: Nurse Practitioner | Admitting: Nurse Practitioner

## 2021-06-01 ENCOUNTER — Other Ambulatory Visit: Payer: Medicare (Managed Care)

## 2021-06-01 ENCOUNTER — Other Ambulatory Visit: Payer: Self-pay

## 2021-06-01 DIAGNOSIS — M25571 Pain in right ankle and joints of right foot: Secondary | ICD-10-CM

## 2021-06-01 DIAGNOSIS — M7661 Achilles tendinitis, right leg: Secondary | ICD-10-CM

## 2021-06-01 LAB — BMP8+EGFR
BUN/Creatinine Ratio: 15 (ref 12–28)
BUN: 10 mg/dL (ref 8–27)
CO2: 26 mmol/L (ref 20–29)
Calcium: 9.5 mg/dL (ref 8.7–10.3)
Chloride: 104 mmol/L (ref 96–106)
Creatinine, Ser: 0.68 mg/dL (ref 0.57–1.00)
Glucose: 100 mg/dL — ABNORMAL HIGH (ref 70–99)
Potassium: 4.9 mmol/L (ref 3.5–5.2)
Sodium: 142 mmol/L (ref 134–144)
eGFR: 97 mL/min/{1.73_m2} (ref >59)

## 2021-06-04 ENCOUNTER — Encounter: Payer: Self-pay | Admitting: Nurse Practitioner

## 2021-06-06 LAB — COLOGUARD: COLOGUARD: POSITIVE — AB

## 2021-06-11 ENCOUNTER — Other Ambulatory Visit: Payer: Self-pay

## 2021-06-11 ENCOUNTER — Ambulatory Visit (INDEPENDENT_AMBULATORY_CARE_PROVIDER_SITE_OTHER): Payer: Medicare (Managed Care) | Admitting: Podiatry

## 2021-06-11 ENCOUNTER — Encounter: Payer: Self-pay | Admitting: Nurse Practitioner

## 2021-06-11 ENCOUNTER — Encounter: Payer: Self-pay | Admitting: Podiatry

## 2021-06-11 ENCOUNTER — Ambulatory Visit: Payer: Medicare (Managed Care)

## 2021-06-11 DIAGNOSIS — M7750 Other enthesopathy of unspecified foot: Secondary | ICD-10-CM

## 2021-06-11 DIAGNOSIS — M7661 Achilles tendinitis, right leg: Secondary | ICD-10-CM | POA: Diagnosis not present

## 2021-06-11 DIAGNOSIS — M21861 Other specified acquired deformities of right lower leg: Secondary | ICD-10-CM

## 2021-06-11 LAB — HM DEXA SCAN

## 2021-06-11 MED ORDER — METHYLPREDNISOLONE 4 MG PO TBPK: ORAL_TABLET | ORAL | 0 refills | Status: DC

## 2021-06-11 MED ORDER — DICLOFENAC SODIUM 1 % EX GEL: 4.0000 g | Freq: Four times a day (QID) | CUTANEOUS | 4 refills | Status: DC

## 2021-06-11 NOTE — Patient Instructions (Signed)
Look for an "EvenUp" shoe attachment on [employer] or at [employer]. This will level out your hips while you are walking in the CAM boot. Wear this on the other foot around a supportive sneaker:      Achilles Tendinitis  with Rehab Achilles tendinitis is a disorder of the Achilles tendon. The Achilles tendon connects the large calf muscles (Gastrocnemius and Soleus) to the heel bone (calcaneus). This tendon is sometimes called the heel cord. It is important for pushing-off and standing on your toes and is important for walking, running, or jumping. Tendinitis is often caused by overuse and repetitive microtrauma. SYMPTOMS Pain, tenderness, swelling, warmth, and redness may occur over the Achilles tendon even at rest. Pain with pushing off, or flexing or extending the ankle. Pain that is worsened after or during activity. CAUSES  Overuse sometimes seen with rapid increase in exercise programs or in sports requiring running and jumping. Poor physical conditioning (strength and flexibility or endurance). Running sports, especially training running down hills. Inadequate warm-up before practice or play or failure to stretch before participation. Injury to the tendon. PREVENTION  Warm up and stretch before practice or competition. Allow time for adequate rest and recovery between practices and competition. Keep up conditioning. Keep up ankle and leg flexibility. Improve or keep muscle strength and endurance. Improve cardiovascular fitness. Use proper technique. Use proper equipment (shoes, skates). To help prevent recurrence, taping, protective strapping, or an adhesive bandage may be recommended for several weeks after healing is complete. PROGNOSIS  Recovery may take weeks to several months to heal. Longer recovery is expected if symptoms have been prolonged. Recovery is usually quicker if the inflammation is due to a direct blow as compared with overuse or sudden strain. RELATED COMPLICATIONS   Healing time will be prolonged if the condition is not correctly treated. The injury must be given plenty of time to heal. Symptoms can reoccur if activity is resumed too soon. Untreated, tendinitis may increase the risk of tendon rupture requiring additional time for recovery and possibly surgery. TREATMENT  The first treatment consists of rest anti-inflammatory medication, and ice to relieve the pain. Stretching and strengthening exercises after resolution of pain will likely help reduce the risk of recurrence. Referral to a physical therapist or athletic trainer for further evaluation and treatment may be helpful. A walking boot or cast may be recommended to rest the Achilles tendon. This can help break the cycle of inflammation and microtrauma. Arch supports (orthotics) may be prescribed or recommended by your caregiver as an adjunct to therapy and rest. Surgery to remove the inflamed tendon lining or degenerated tendon tissue is rarely necessary and has shown less than predictable results. MEDICATION  Nonsteroidal anti-inflammatory medications, such as aspirin and ibuprofen, may be used for pain and inflammation relief. Do not take within 7 days before surgery. Take these as directed by your caregiver. Contact your caregiver immediately if any bleeding, stomach upset, or signs of allergic reaction occur. Other minor pain relievers, such as acetaminophen, may also be used. Pain relievers may be prescribed as necessary by your caregiver. Do not take prescription pain medication for longer than 4 to 7 days. Use only as directed and only as much as you need. Cortisone injections are rarely indicated. Cortisone injections may weaken tendons and predispose to rupture. It is better to give the condition more time to heal than to use them. HEAT AND COLD Cold is used to relieve pain and reduce inflammation for acute and chronic Achilles tendinitis. Cold  should be applied for 10 to 15 minutes every 2 to 3  hours for inflammation and pain and immediately after any activity that aggravates your symptoms. Use ice packs or an ice massage. Heat may be used before performing stretching and strengthening activities prescribed by your caregiver. Use a heat pack or a warm soak. SEEK MEDICAL CARE IF: Symptoms get worse or do not improve in 2 weeks despite treatment. New, unexplained symptoms develop. Drugs used in treatment may produce side effects.  EXERCISES:  RANGE OF MOTION (ROM) AND STRETCHING EXERCISES - Achilles Tendinitis  These exercises may help you when beginning to rehabilitate your injury. Your symptoms may resolve with or without further involvement from your physician, physical therapist or athletic trainer. While completing these exercises, remember:  Restoring tissue flexibility helps normal motion to return to the joints. This allows healthier, less painful movement and activity. An effective stretch should be held for at least 30 seconds. A stretch should never be painful. You should only feel a gentle lengthening or release in the stretched tissue.  STRETCH  Gastroc, Standing  Place hands on wall. Extend right / left leg, keeping the front knee somewhat bent. Slightly point your toes inward on your back foot. Keeping your right / left heel on the floor and your knee straight, shift your weight toward the wall, not allowing your back to arch. You should feel a gentle stretch in the right / left calf. Hold this position for 10 seconds. Repeat 3 times. Complete this stretch 2 times per day.  STRETCH  Soleus, Standing  Place hands on wall. Extend right / left leg, keeping the other knee somewhat bent. Slightly point your toes inward on your back foot. Keep your right / left heel on the floor, bend your back knee, and slightly shift your weight over the back leg so that you feel a gentle stretch deep in your back calf. Hold this position for 10 seconds. Repeat 3 times. Complete this  stretch 2 times per day.  STRETCH  Gastrocsoleus, Standing  Note: This exercise can place a lot of stress on your foot and ankle. Please complete this exercise only if specifically instructed by your caregiver.  Place the ball of your right / left foot on a step, keeping your other foot firmly on the same step. Hold on to the wall or a rail for balance. Slowly lift your other foot, allowing your body weight to press your heel down over the edge of the step. You should feel a stretch in your right / left calf. Hold this position for 10 seconds. Repeat this exercise with a slight bend in your knee. Repeat 3 times. Complete this stretch 2 times per day.   STRENGTHENING EXERCISES - Achilles Tendinitis These exercises may help you when beginning to rehabilitate your injury. They may resolve your symptoms with or without further involvement from your physician, physical therapist or athletic trainer. While completing these exercises, remember:  Muscles can gain both the endurance and the strength needed for everyday activities through controlled exercises. Complete these exercises as instructed by your physician, physical therapist or athletic trainer. Progress the resistance and repetitions only as guided. You may experience muscle soreness or fatigue, but the pain or discomfort you are trying to eliminate should never worsen during these exercises. If this pain does worsen, stop and make certain you are following the directions exactly. If the pain is still present after adjustments, discontinue the exercise until you can discuss the trouble with  your clinician.  STRENGTH - Plantar-flexors  Sit with your right / left leg extended. Holding onto both ends of a rubber exercise band/tubing, loop it around the ball of your foot. Keep a slight tension in the band. Slowly push your toes away from you, pointing them downward. Hold this position for 10 seconds. Return slowly, controlling the tension in the  band/tubing. Repeat 3 times. Complete this exercise 2 times per day.   STRENGTH - Plantar-flexors  Stand with your feet shoulder width apart. Steady yourself with a wall or table using as little support as needed. Keeping your weight evenly spread over the width of your feet, rise up on your toes.* Hold this position for 10 seconds. Repeat 3 times. Complete this exercise 2 times per day.  *If this is too easy, shift your weight toward your right / left leg until you feel challenged. Ultimately, you may be asked to do this exercise with your right / left foot only.  STRENGTH  Plantar-flexors, Eccentric  Note: This exercise can place a lot of stress on your foot and ankle. Please complete this exercise only if specifically instructed by your caregiver.  Place the balls of your feet on a step. With your hands, use only enough support from a wall or rail to keep your balance. Keep your knees straight and rise up on your toes. Slowly shift your weight entirely to your right / left toes and pick up your opposite foot. Gently and with controlled movement, lower your weight through your right / left foot so that your heel drops below the level of the step. You will feel a slight stretch in the back of your calf at the end position. Use the healthy leg to help rise up onto the balls of both feet, then lower weight only on the right / left leg again. Build up to 15 repetitions. Then progress to 3 consecutive sets of 15 repetitions.* After completing the above exercise, complete the same exercise with a slight knee bend (about 30 degrees). Again, build up to 15 repetitions. Then progress to 3 consecutive sets of 15 repetitions.* Perform this exercise 2 times per day.  *When you easily complete 3 sets of 15, your physician, physical therapist or athletic trainer may advise you to add resistance by wearing a backpack filled with additional weight.  STRENGTH - Plantar Flexors, Seated  Sit on a chair that  allows your feet to rest flat on the ground. If necessary, sit at the edge of the chair. Keeping your toes firmly on the ground, lift your right / left heel as far as you can without increasing any discomfort in your ankle. Repeat 3 times. Complete this exercise 2 times a day.

## 2021-06-11 NOTE — Progress Notes (Signed)
°  Subjective:  Patient ID: Madison Wells, female    DOB: 1955/07/12,  MRN: [id number]  Chief Complaint  Patient presents with   Tendonitis    )(np) Right Achilles tendinitis/Heel pain    66 y.o. female presents with the above complaint. History confirmed with patient.  Pain is quite severe and has been worsening over the last few months.  She has had prior left ankle surgery.  She had x-rays recently completed.  No treatment thus far.  Objective:  Physical Exam: warm, good capillary refill, no trophic changes or ulcerative lesions, normal DP and PT pulses, normal sensory exam, and severe pain on palpation with fullness and edema around the distal insertion of the Achilles tendon she has 5 out of 5 strength in complete continuity of the tendon with a negative Thompson test.  Radiographs: Multiple views x-ray of the right foot: Severe hallux deformity and sclerosis and thickening of the Achilles tendon posteriorly with calcifications within the tendon Assessment:   1. Achilles tendinitis of right lower extremity   2. Gastrocnemius equinus of right lower extremity      Plan:  Patient was evaluated and treated and all questions answered.  Discussed the etiology and treatment options for Achilles tendinitis including stretching, formal physical therapy with an eccentric exercises therapy plan, supportive shoegears such as a running shoe or sneaker, heel lifts, topical and oral medications.  We also discussed that I do not routinely perform injections in this area because of the risk of an increased damage or rupture of the tendon.  We also discussed the role of surgical treatment of this for patients who do not improve after exhausting non-surgical treatment options.  -XR reviewed with patient -Educated on stretching and icing of the affected limb. -Referral placed to physical therapy. -Rx for Medrol 6-day taper. Advised on risks, benefits, and alternatives of the medication -Takes  800 mg of ibuprofen 3 times daily as well as 15 mg oxycodone 6 times daily -She has a 1 to 2 pack/day smoker.  I do not think she would be a good surgical candidate and would be at high risk of complications from this -Voltaren gel is recommended I gave her a prescription for this as well -CAM boot was dispensed she may be WBAT in this  Return in about 8 weeks (around 08/06/2021) for re-check Achilles tendon.

## 2021-06-12 ENCOUNTER — Encounter: Payer: Self-pay | Admitting: Podiatry

## 2021-06-12 ENCOUNTER — Encounter: Payer: Self-pay | Admitting: Nurse Practitioner

## 2021-06-19 ENCOUNTER — Other Ambulatory Visit: Payer: Self-pay | Admitting: Nurse Practitioner

## 2021-07-21 ENCOUNTER — Other Ambulatory Visit: Payer: Self-pay | Admitting: Nurse Practitioner

## 2021-07-31 ENCOUNTER — Other Ambulatory Visit: Payer: Self-pay | Admitting: Nurse Practitioner

## 2021-08-05 ENCOUNTER — Other Ambulatory Visit: Payer: Self-pay | Admitting: Nurse Practitioner

## 2021-08-05 DIAGNOSIS — R600 Localized edema: Secondary | ICD-10-CM

## 2021-08-06 ENCOUNTER — Encounter: Payer: Self-pay | Admitting: Podiatry

## 2021-08-06 ENCOUNTER — Observation Stay (HOSPITAL_COMMUNITY)
Admission: EM | Admit: 2021-08-06 | Discharge: 2021-08-07 | Disposition: A | Discharge status: Home / Self Care | Payer: Medicare (Managed Care) | Attending: Pulmonary Disease | Admitting: Pulmonary Disease

## 2021-08-06 ENCOUNTER — Ambulatory Visit (INDEPENDENT_AMBULATORY_CARE_PROVIDER_SITE_OTHER): Payer: Medicare (Managed Care) | Admitting: Podiatry

## 2021-08-06 ENCOUNTER — Other Ambulatory Visit: Payer: Self-pay

## 2021-08-06 DIAGNOSIS — I10 Essential (primary) hypertension: Secondary | ICD-10-CM | POA: Diagnosis not present

## 2021-08-06 DIAGNOSIS — M21861 Other specified acquired deformities of right lower leg: Secondary | ICD-10-CM

## 2021-08-06 DIAGNOSIS — E119 Type 2 diabetes mellitus without complications: Secondary | ICD-10-CM | POA: Insufficient documentation

## 2021-08-06 DIAGNOSIS — T402X1A Poisoning by other opioids, accidental (unintentional), initial encounter: Secondary | ICD-10-CM | POA: Diagnosis present

## 2021-08-06 DIAGNOSIS — Z7982 Long term (current) use of aspirin: Secondary | ICD-10-CM | POA: Insufficient documentation

## 2021-08-06 DIAGNOSIS — L0390 Cellulitis, unspecified: Secondary | ICD-10-CM | POA: Diagnosis not present

## 2021-08-06 DIAGNOSIS — Z7984 Long term (current) use of oral hypoglycemic drugs: Secondary | ICD-10-CM | POA: Insufficient documentation

## 2021-08-06 DIAGNOSIS — F17210 Nicotine dependence, cigarettes, uncomplicated: Secondary | ICD-10-CM | POA: Diagnosis not present

## 2021-08-06 DIAGNOSIS — T50901A Poisoning by unspecified drugs, medicaments and biological substances, accidental (unintentional), initial encounter: Secondary | ICD-10-CM | POA: Diagnosis present

## 2021-08-06 DIAGNOSIS — Z79899 Other long term (current) drug therapy: Secondary | ICD-10-CM | POA: Diagnosis not present

## 2021-08-06 DIAGNOSIS — M7661 Achilles tendinitis, right leg: Secondary | ICD-10-CM

## 2021-08-06 DIAGNOSIS — Z20822 Contact with and (suspected) exposure to covid-19: Secondary | ICD-10-CM | POA: Insufficient documentation

## 2021-08-06 DIAGNOSIS — T50904A Poisoning by unspecified drugs, medicaments and biological substances, undetermined, initial encounter: Principal | ICD-10-CM

## 2021-08-06 LAB — ACETAMINOPHEN LEVEL: Acetaminophen (Tylenol), Serum: <10 ug/mL — ABNORMAL LOW (ref 10–30)

## 2021-08-06 LAB — SALICYLATE LEVEL: Salicylate Lvl: <7 mg/dL — ABNORMAL LOW (ref 7.0–30.0)

## 2021-08-06 LAB — CBG MONITORING, ED: Glucose-Capillary: 104 mg/dL — ABNORMAL HIGH (ref 70–99)

## 2021-08-06 LAB — CBC WITH DIFFERENTIAL/PLATELET
Abs Immature Granulocytes: 0.04 10*3/uL (ref 0.00–0.07)
Basophils Absolute: 0 10*3/uL (ref 0.0–0.1)
Basophils Relative: 0 %
Eosinophils Absolute: 0.2 10*3/uL (ref 0.0–0.5)
Eosinophils Relative: 3 %
HCT: 41.6 % (ref 36.0–46.0)
Hemoglobin: 13.9 g/dL (ref 12.0–15.0)
Immature Granulocytes: 0 %
Lymphocytes Relative: 30 %
Lymphs Abs: 2.8 10*3/uL (ref 0.7–4.0)
MCH: 31.1 pg (ref 26.0–34.0)
MCHC: 33.4 g/dL (ref 30.0–36.0)
MCV: 93.1 fL (ref 80.0–100.0)
Monocytes Absolute: 0.8 10*3/uL (ref 0.1–1.0)
Monocytes Relative: 8 %
Neutro Abs: 5.5 10*3/uL (ref 1.7–7.7)
Neutrophils Relative %: 59 %
Platelets: 205 10*3/uL (ref 150–400)
RBC: 4.47 MIL/uL (ref 3.87–5.11)
RDW: 13.1 % (ref 11.5–15.5)
WBC: 9.3 10*3/uL (ref 4.0–10.5)
nRBC: 0 % (ref 0.0–0.2)

## 2021-08-06 LAB — COMPREHENSIVE METABOLIC PANEL
ALT: 16 U/L (ref 0–44)
AST: 20 U/L (ref 15–41)
Albumin: 3.6 g/dL (ref 3.5–5.0)
Alkaline Phosphatase: 71 U/L (ref 38–126)
Anion gap: 8 (ref 5–15)
BUN: 16 mg/dL (ref 8–23)
CO2: 27 mmol/L (ref 22–32)
Calcium: 8.8 mg/dL — ABNORMAL LOW (ref 8.9–10.3)
Chloride: 103 mmol/L (ref 98–111)
Creatinine, Ser: 0.66 mg/dL (ref 0.44–1.00)
GFR, Estimated: >60 mL/min (ref >60)
Glucose, Bld: 123 mg/dL — ABNORMAL HIGH (ref 70–99)
Potassium: 3.6 mmol/L (ref 3.5–5.1)
Sodium: 138 mmol/L (ref 135–145)
Total Bilirubin: 0.1 mg/dL — ABNORMAL LOW (ref 0.3–1.2)
Total Protein: 6.8 g/dL (ref 6.5–8.1)

## 2021-08-06 LAB — ETHANOL: Alcohol, Ethyl (B): 76 mg/dL — ABNORMAL HIGH (ref <10)

## 2021-08-06 MED ORDER — NALOXONE HCL 4 MG/10ML IJ SOLN
0.5000 mg/h | INTRAVENOUS | Status: DC
  Administered 2021-08-06 – 2021-08-07 (×2): 0.5 mg/h via INTRAVENOUS
  Filled 2021-08-06 (×4): qty 10

## 2021-08-06 NOTE — ED Notes (Addendum)
[facility] Rep Lennette Bihari  ?Discussed medications, VS, EKG, and pending labs.  ? ?Recommended OBS 4 hours after last dose of naloxone, if IV infusion post 7 hours after discontinuing IVF.  ?OBS at least till 3 am.  ? ?Acetaminophen level now and post ingestion at 1:30 am. ? ?Discussed recommendations with A Palumbo.  ? ? ? ? ?

## 2021-08-06 NOTE — ED Triage Notes (Addendum)
Pt BIB [facility] from home following accidental overdose. Pt states filling pill container for next four days and accidentally took approximately 10 15-mg oxycodone pills around 9 pm. Was given 0.4 IV by EMS d/t snoring respiration. Pt arrives to ED alert and orients, airway intact, 100% room air. EMS VS: BP 170/90, HR 60s, CBG 145, 20G L hand. Denies SI/HI  ?

## 2021-08-06 NOTE — ED Notes (Signed)
[employer] at bedside 

## 2021-08-06 NOTE — Patient Instructions (Signed)
Call  [phone] to schedule physical therapy if you do not hear from them by Monday  ?

## 2021-08-07 ENCOUNTER — Encounter (HOSPITAL_COMMUNITY): Payer: Self-pay | Admitting: Emergency Medicine

## 2021-08-07 DIAGNOSIS — T50901A Poisoning by unspecified drugs, medicaments and biological substances, accidental (unintentional), initial encounter: Secondary | ICD-10-CM

## 2021-08-07 LAB — HIV ANTIBODY (ROUTINE TESTING W REFLEX): HIV Screen 4th Generation wRfx: NONREACTIVE

## 2021-08-07 LAB — RESP PANEL BY RT-PCR (FLU A&B, COVID) ARPGX2
Influenza A by PCR: NEGATIVE
Influenza B by PCR: NEGATIVE
SARS Coronavirus 2 by RT PCR: NEGATIVE

## 2021-08-07 LAB — RAPID URINE DRUG SCREEN, HOSP PERFORMED
Amphetamines: NOT DETECTED
Barbiturates: NOT DETECTED
Benzodiazepines: NOT DETECTED
Cocaine: NOT DETECTED
Opiates: POSITIVE — AB
Tetrahydrocannabinol: NOT DETECTED

## 2021-08-07 LAB — BASIC METABOLIC PANEL
Anion gap: 8 (ref 5–15)
BUN: 14 mg/dL (ref 8–23)
CO2: 28 mmol/L (ref 22–32)
Calcium: 8.8 mg/dL — ABNORMAL LOW (ref 8.9–10.3)
Chloride: 103 mmol/L (ref 98–111)
Creatinine, Ser: 0.47 mg/dL (ref 0.44–1.00)
GFR, Estimated: >60 mL/min (ref >60)
Glucose, Bld: 149 mg/dL — ABNORMAL HIGH (ref 70–99)
Potassium: 3.8 mmol/L (ref 3.5–5.1)
Sodium: 139 mmol/L (ref 135–145)

## 2021-08-07 LAB — PHOSPHORUS: Phosphorus: 4.2 mg/dL (ref 2.5–4.6)

## 2021-08-07 LAB — CBC
HCT: 38.7 % (ref 36.0–46.0)
Hemoglobin: 12.9 g/dL (ref 12.0–15.0)
MCH: 30.9 pg (ref 26.0–34.0)
MCHC: 33.3 g/dL (ref 30.0–36.0)
MCV: 92.6 fL (ref 80.0–100.0)
Platelets: 211 10*3/uL (ref 150–400)
RBC: 4.18 MIL/uL (ref 3.87–5.11)
RDW: 13.2 % (ref 11.5–15.5)
WBC: 7.7 10*3/uL (ref 4.0–10.5)
nRBC: 0 % (ref 0.0–0.2)

## 2021-08-07 LAB — ACETAMINOPHEN LEVEL: Acetaminophen (Tylenol), Serum: <10 ug/mL — ABNORMAL LOW (ref 10–30)

## 2021-08-07 LAB — MRSA NEXT GEN BY PCR, NASAL: MRSA by PCR Next Gen: DETECTED — AB

## 2021-08-07 LAB — MAGNESIUM: Magnesium: 1.9 mg/dL (ref 1.7–2.4)

## 2021-08-07 MED ORDER — OXYBUTYNIN CHLORIDE 5 MG PO TABS
5.0000 mg | ORAL_TABLET | Freq: Every day | ORAL | Status: DC
  Administered 2021-08-07: 5 mg via ORAL
  Filled 2021-08-07: qty 1

## 2021-08-07 MED ORDER — SPIRONOLACTONE 25 MG PO TABS
25.0000 mg | ORAL_TABLET | Freq: Every day | ORAL | Status: DC
  Administered 2021-08-07: 25 mg via ORAL
  Filled 2021-08-07: qty 1

## 2021-08-07 MED ORDER — HYDROCHLOROTHIAZIDE 12.5 MG PO TABS
12.5000 mg | ORAL_TABLET | Freq: Every day | ORAL | Status: DC
  Administered 2021-08-07: 12.5 mg via ORAL
  Filled 2021-08-07: qty 1

## 2021-08-07 MED ORDER — POLYETHYLENE GLYCOL 3350 17 G PO PACK: 17.0000 g | PACK | Freq: Every day | ORAL | Status: DC | PRN

## 2021-08-07 MED ORDER — BUPROPION HCL ER (XL) 150 MG PO TB24
150.0000 mg | ORAL_TABLET | Freq: Every day | ORAL | Status: DC
  Administered 2021-08-07: 150 mg via ORAL
  Filled 2021-08-07: qty 1

## 2021-08-07 MED ORDER — OYSTER SHELL CALCIUM/D3 500-5 MG-MCG PO TABS
1.0000 | ORAL_TABLET | Freq: Every day | ORAL | Status: DC
  Administered 2021-08-07: 1 via ORAL
  Filled 2021-08-07: qty 1

## 2021-08-07 MED ORDER — GABAPENTIN 300 MG PO CAPS
300.0000 mg | ORAL_CAPSULE | Freq: Three times a day (TID) | ORAL | Status: DC
  Administered 2021-08-07 (×2): 300 mg via ORAL
  Filled 2021-08-07 (×2): qty 1

## 2021-08-07 MED ORDER — CALCIUM CARB-CHOLECALCIFEROL 500-3.125 MG-MCG PO TABS: 1.0000 | ORAL_TABLET | Freq: Every morning | ORAL | Status: DC

## 2021-08-07 MED ORDER — AMLODIPINE BESYLATE 5 MG PO TABS
5.0000 mg | ORAL_TABLET | Freq: Every day | ORAL | Status: DC
  Administered 2021-08-07: 5 mg via ORAL
  Filled 2021-08-07: qty 1

## 2021-08-07 MED ORDER — SODIUM CHLORIDE 0.9 % IV SOLN
2.0000 g | Freq: Once | INTRAVENOUS | Status: AC
  Administered 2021-08-07: 2 g via INTRAVENOUS
  Filled 2021-08-07: qty 20

## 2021-08-07 MED ORDER — PAROXETINE HCL 10 MG PO TABS
10.0000 mg | ORAL_TABLET | Freq: Every day | ORAL | Status: DC
  Administered 2021-08-07: 10 mg via ORAL
  Filled 2021-08-07: qty 1

## 2021-08-07 MED ORDER — ATORVASTATIN CALCIUM 10 MG PO TABS
10.0000 mg | ORAL_TABLET | Freq: Every day | ORAL | Status: DC
  Administered 2021-08-07: 10 mg via ORAL
  Filled 2021-08-07: qty 1

## 2021-08-07 MED ORDER — DICLOFENAC SODIUM 1 % EX GEL: 4.0000 g | Freq: Two times a day (BID) | CUTANEOUS | Status: DC | PRN

## 2021-08-07 MED ORDER — DOCUSATE SODIUM 100 MG PO CAPS: 100.0000 mg | ORAL_CAPSULE | Freq: Two times a day (BID) | ORAL | Status: DC | PRN

## 2021-08-07 MED ORDER — ASPIRIN EC 81 MG PO TBEC
81.0000 mg | DELAYED_RELEASE_TABLET | Freq: Every day | ORAL | Status: DC
  Administered 2021-08-07: 81 mg via ORAL
  Filled 2021-08-07: qty 1

## 2021-08-07 MED ORDER — CHLORHEXIDINE GLUCONATE CLOTH 2 % EX PADS
6.0000 | MEDICATED_PAD | Freq: Every day | CUTANEOUS | Status: DC
  Administered 2021-08-07: 6 via TOPICAL

## 2021-08-07 MED ORDER — HEPARIN SODIUM (PORCINE) 5000 UNIT/ML IJ SOLN
5000.0000 [IU] | Freq: Three times a day (TID) | INTRAMUSCULAR | Status: DC
  Administered 2021-08-07 (×2): 5000 [IU] via SUBCUTANEOUS
  Filled 2021-08-07 (×2): qty 1

## 2021-08-07 NOTE — Progress Notes (Signed)
Alerted by Gerald Leitz, NP that pt is medically cleared for dc and attending MD has requested that IVC be rescinded as he feels she is not a threat to herself or others. (Please refer to MD documentation on this matter.)  Change of Commitment notice completed and faxed to Magistrate's office.  No further TOC needs. ? ?Jaclynne Baldo, LCSW  ?

## 2021-08-07 NOTE — ED Provider Notes (Signed)
?[facility] DEPT ?Provider Note ? ? ?CSN: [id number] ?Arrival date & time: 08/06/21  2244 ? ?  ? ?History ? ?Chief Complaint  ?Patient presents with  ? Drug Overdose  ? ? ?Madison Wells is a 66 y.o. female. ? ?The history is provided by the patient, the EMS personnel and the police.  ?Drug Overdose ?This is a new problem. The current episode started 1 to 2 hours ago. The problem occurs constantly. The problem has not changed since onset.Pertinent negatives include no abdominal pain. Nothing aggravates the symptoms. Nothing relieves the symptoms. Treatments tried: narcan. The treatment provided significant relief.  ?Patient with chronic pain on home narcotics who took at least 10 15 mg oxycodone.  Was sonorous and was given narcan with good effect.   ?  ? ?Home Medications ?Prior to Admission medications   ?Medication Sig Start Date End Date Taking? Authorizing Provider  ?alendronate (FOSAMAX) 70 MG tablet TAKE 1 TABLET EVERY WEEK IN THE MORNING AT LEAST 30 MINS BEFORE FIRST FOOD/BEVERAGE/OR MEDS OF DAY. 06/20/21   [name], FNP  ?aspirin EC 81 MG tablet Take 81 mg by mouth daily.    [provider]  ?atorvastatin (LIPITOR) 10 MG tablet TAKE 1 TABLET BY MOUTH EVERY DAY 05/20/21   [name], FNP  ?buPROPion (WELLBUTRIN XL) 150 MG 24 hr tablet TAKE 1 TABLET BY MOUTH EVERY DAY 07/31/21   [name], FNP  ?Calcium Carbonate-Vitamin D (CALCIUM 500 + D) 500-125 MG-UNIT TABS Take 1 tablet by mouth daily at 12 noon.    [provider]  ?cyclobenzaprine (FLEXERIL) 10 MG tablet Take 1 tablet (10 mg total) by mouth 3 (three) times daily as needed for muscle spasms. 02/22/19   [name], FNP  ?diclofenac Sodium (VOLTAREN) 1 % GEL Apply 4 g topically 4 (four) times daily. 06/11/21   McDonald, Stephan Minister, DPM  ?gabapentin (NEURONTIN) 300 MG capsule TAKE 1 CAPSULE BY MOUTH THREE TIMES A DAY 07/21/21   [name], FNP  ?hydrochlorothiazide (HYDRODIURIL) 12.5 MG tablet  TAKE 1 TABLET BY MOUTH EVERY DAY 07/31/21   [name], FNP  ?ibuprofen (ADVIL,MOTRIN) 800 MG tablet Take 800 mg by mouth 3 (three) times daily.    [provider]  ?ketoconazole (NIZORAL) 2 % shampoo Apply 1 application topically 2 (two) times a week. 11/13/20   [name], [place]  ?metFORMIN (GLUCOPHAGE) 500 MG tablet TAKE 1 TABLET BY MOUTH 2 TIMES DAILY WITH A MEAL. 07/31/21   [name], [place]  ?methylPREDNISolone (MEDROL DOSEPAK) 4 MG TBPK tablet 6 day dose pack - take as directed 06/11/21   Criselda Peaches, DPM  ?mometasone (ELOCON) 0.1 % cream APPLY TO AFFECTED AREA A THIN LAYER DAILY 11/25/20   [name], FNP  ?Multiple Vitamins-Minerals (MULTIVITAMIN WITH MINERALS) tablet Take 1 tablet by mouth daily.    [provider]  ?nicotine (NICODERM CQ - DOSED IN MG/24 HOURS) 21 mg/24hr patch PLACE 1 PATCH ONTO THE SKIN DAILY. 08/25/20   [name], FNP  ?oxybutynin (DITROPAN) 5 MG tablet TAKE 1 TABLET BY MOUTH EVERY DAY 05/20/21   [name], FNP  ?oxyCODONE HCl 15 MG TABA Take 10 mg by mouth 4 (four) times daily.     [provider]  ?PARoxetine (PAXIL) 10 MG tablet TAKE 1 TABLET BY MOUTH EVERY DAY 05/20/21   [name], FNP  ?phentermine 15 MG capsule Take 15 mg by mouth daily. 11/14/18   [provider]  ?potassium chloride SA (KLOR-CON M20) 20 MEQ  tablet TAKE 2 TABLETS BY MOUTH EVERY DAY 07/31/21   [name], FNP  ?spironolactone (ALDACTONE) 25 MG tablet TAKE 1 TABLET BY MOUTH EVERY DAY 08/06/21   [name], FNP  ?   ? ?Allergies    ?Patient has no known allergies.   ? ?Review of Systems   ?Review of Systems  ?Unable to perform ROS: Acuity of condition  ?Constitutional:  Negative for fever.  ?HENT:  Negative for facial swelling.   ?Eyes:  Negative for redness.  ?Respiratory:  Negative for wheezing and stridor.   ?Gastrointestinal:  Negative for abdominal pain.  ? ?Physical Exam ?Updated Vital Signs ?BP (!) 159/59   Pulse 68   Temp 97.7 ?F (36.5 ?C) (Oral)    Resp 15   Ht 5' 3.8" (1.621 m)   Wt 107.2 kg   LMP 08/13/2011   SpO2 96%   BMI 40.83 kg/m?  ?Physical Exam ?Vitals and nursing note reviewed. Exam conducted with a chaperone present.  ?Constitutional:   ?   Comments: Sleepy but awake   ?HENT:  ?   Head: Normocephalic and atraumatic.  ?   Nose: Nose normal.  ?Eyes:  ?   Conjunctiva/sclera: Conjunctivae normal.  ?Cardiovascular:  ?   Rate and Rhythm: Normal rate and regular rhythm.  ?   Pulses: Normal pulses.  ?   Heart sounds: Normal heart sounds.  ?Pulmonary:  ?   Effort: Pulmonary effort is normal.  ?   Breath sounds: Normal breath sounds.  ?Abdominal:  ?   Tenderness: There is no abdominal tenderness. There is no guarding.  ?Musculoskeletal:  ?   Cervical back: Normal range of motion and neck supple.  ?Skin: ?   General: Skin is warm.  ?   Capillary Refill: Capillary refill takes less than 2 seconds.  ?   Findings: Erythema present.  ?   Comments: Erythema of the left shin   ?Neurological:  ?   Deep Tendon Reflexes: Reflexes normal.  ? ? ?ED Results / Procedures / Treatments   ?Labs ?(all labs ordered are listed, but only abnormal results are displayed) ?Results for orders placed or performed during the hospital encounter of 08/06/21  ?Resp Panel by RT-PCR (Flu A&B, Covid) Nasopharyngeal Swab  ? Specimen: Nasopharyngeal Swab; Nasopharyngeal(NP) swabs in vial transport medium  ?Result Value Ref Range  ? SARS Coronavirus 2 by RT PCR NEGATIVE NEGATIVE  ? Influenza A by PCR NEGATIVE NEGATIVE  ? Influenza B by PCR NEGATIVE NEGATIVE  ?Comprehensive metabolic panel  ?Result Value Ref Range  ? Sodium 138 135 - 145 mmol/L  ? Potassium 3.6 3.5 - 5.1 mmol/L  ? Chloride 103 98 - 111 mmol/L  ? CO2 27 22 - 32 mmol/L  ? Glucose, Bld 123 (H) 70 - 99 mg/dL  ? BUN 16 8 - 23 mg/dL  ? Creatinine, Ser 0.66 0.44 - 1.00 mg/dL  ? Calcium 8.8 (L) 8.9 - 10.3 mg/dL  ? Total Protein 6.8 6.5 - 8.1 g/dL  ? Albumin 3.6 3.5 - 5.0 g/dL  ? AST 20 15 - 41 U/L  ? ALT 16 0 - 44 U/L  ? Alkaline  Phosphatase 71 38 - 126 U/L  ? Total Bilirubin 0.1 (L) 0.3 - 1.2 mg/dL  ? GFR, Estimated >60 >60 mL/min  ? Anion gap 8 5 - 15  ?Salicylate level  ?Result Value Ref Range  ? Salicylate Lvl <3.2 (L) 7.0 - 30.0 mg/dL  ?Acetaminophen level  ?Result Value Ref Range  ? Acetaminophen (Tylenol),  Serum <10 (L) 10 - 30 ug/mL  ?Ethanol  ?Result Value Ref Range  ? Alcohol, Ethyl (B) 76 (H) <10 mg/dL  ?CBC WITH DIFFERENTIAL  ?Result Value Ref Range  ? WBC 9.3 4.0 - 10.5 K/uL  ? RBC 4.47 3.87 - 5.11 MIL/uL  ? Hemoglobin 13.9 12.0 - 15.0 g/dL  ? HCT 41.6 36.0 - 46.0 %  ? MCV 93.1 80.0 - 100.0 fL  ? MCH 31.1 26.0 - 34.0 pg  ? MCHC 33.4 30.0 - 36.0 g/dL  ? RDW 13.1 11.5 - 15.5 %  ? Platelets 205 150 - 400 K/uL  ? nRBC 0.0 0.0 - 0.2 %  ? Neutrophils Relative % 59 %  ? Neutro Abs 5.5 1.7 - 7.7 K/uL  ? Lymphocytes Relative 30 %  ? Lymphs Abs 2.8 0.7 - 4.0 K/uL  ? Monocytes Relative 8 %  ? Monocytes Absolute 0.8 0.1 - 1.0 K/uL  ? Eosinophils Relative 3 %  ? Eosinophils Absolute 0.2 0.0 - 0.5 K/uL  ? Basophils Relative 0 %  ? Basophils Absolute 0.0 0.0 - 0.1 K/uL  ? Immature Granulocytes 0 %  ? Abs Immature Granulocytes 0.04 0.00 - 0.07 K/uL  ?CBG monitoring, ED  ?Result Value Ref Range  ? Glucose-Capillary 104 (H) 70 - 99 mg/dL  ? ?No results found. ? ?EKG ? EKG Interpretation ? ?Date/Time:  Thursday August 06 2021 23:08:31 EDT ?Ventricular Rate:  71 ?PR Interval:  169 ?QRS Duration: 101 ?QT Interval:  423 ?QTC Calculation: 460 ?R Axis:   15 ?Text Interpretation: Sinus rhythm Low voltage, precordial leads Confirmed by Randal Buba, Arbutus Nelligan ([id number]) on 08/07/2021 12:38:49 AM ?  ? ?  ? ? ? ?Radiology ?No results found. ? ?Procedures ?Procedures  ? ? ?Medications Ordered in ED ?Medications  ?naloxone HCl (NARCAN) 4 mg in dextrose 5 % 250 mL infusion (0.5 mg/hr Intravenous New Bag/Given 08/06/21 2351)  ? ? ?ED Course/ Medical Decision Making/ A&P ?  ?                        ?Medical Decision Making ?PER police called out for overdose undetermined  intent  ? ?Amount and/or Complexity of Data Reviewed ?External Data Reviewed: notes. ?   Details: previous notes reviewd ?Labs: ordered. ?   Details: all labs reviewed by me:  Normal white blood cell count, normal h

## 2021-08-07 NOTE — ED Notes (Signed)
Pt asked another nurse to send in the doctor because she was ready to leave. This Probation officer and CN went in to pt room and explained that she is IVC'd and cannot leave. Pt worried about how she is going to get her husband to his appointments in the morning, so she was permitted to call her husband and let him know she wouldn't be home. Pt's belongings were removed from room and placed in pt belonging bags.  ? ?Pt has two belonging bags: ?One bag containing a cam boot ?Other bag containing her personal clothing and belongings ? ?Pt's belongings placed in cabinet at nurses station labeled "19-22 and [place] [address]". ?

## 2021-08-07 NOTE — Care Management CC44 (Signed)
Condition Code 44 Documentation Completed ? ?Patient Details  ?Name: Madison Wells ?MRN: [id number] ?Date of Birth: [birth year] ? ? ?Condition Code 44 given:  Yes ?Patient signature on Condition Code 44 notice:  Yes (verbal consent to Madison Wells, [place]) ?Documentation of 2 MD's agreement:  Yes ?Code 44 added to claim:  Yes ? ? ? ?Antoneo Ghrist, LCSW ?08/07/2021, 3:16 PM ? ?

## 2021-08-07 NOTE — ED Notes (Signed)
Patients daughter called for an update : Madison Wells [phone] ?

## 2021-08-07 NOTE — ED Notes (Signed)
Report given to [facility] at 2M08. Report also given to ED RN Jinny Blossom.  ?

## 2021-08-07 NOTE — Progress Notes (Signed)
? ?NAME:  Madison Wells, MRN:  [id number], DOB:  1955/12/20, LOS: 0 ?ADMISSION DATE:  08/06/2021, CONSULTATION DATE: 08/07/2021 ?REFERRING MD:  EDP CHIEF COMPLAINT:  drug overdose ? ?History of Present Illness:  ?Madison Wells is a 66 y.o. woman with history of chronic pain who came in brought in by EMS from home after 10 15 mg oxycodone pills were taken around 9pm on 3/16. Given narcan with good effect for lethargy and somnolence. Required persistent dosing so narcan infusion was started. PCCM asked to admit the patient to the ICU in light of potential to decompensate and respiratory failure monitoring.  While on narcan drip this morning she is awake and alert and oriented. She was sorting her pills in her pill box and accidentally took the pills she meant to sort rather than the ones in her box - she was apparently doing all this with the lights off. She denies intent for self-harm and denies any desire for SI. Nurse confirms she has been consistently denying self-harm.  ? ?Pertinent  Medical History  ?Chronic Pain ?Type 2 DM ?HLD ?Bipolar disorder ?Osteoporosis ? ? ?Significant Hospital Events: ?Including procedures, antibiotic start and stop dates in addition to other pertinent events   ?3/17 - presented to ED after being brought in by EMS. Able to be weaned off Narcan drip by an   ? ?Interim History / Subjective:  ?States overdose was unintentional, states she grabbed the wrong medication bottle and took 10 '10mg'$  oxycodone at once  ? ?Objective   ?Blood pressure (!) 161/147, pulse (!) 58, temperature 98.3 ?F (36.8 ?C), temperature source Oral, resp. rate 19, height '5\' 5"'$  (1.651 m), weight 108.7 kg, last menstrual period 08/13/2011, SpO2 99 %. ?   ?   ? ?Intake/Output Summary (Last 24 hours) at 08/07/2021 1031 ?Last data filed at 08/07/2021 0930 ?Gross per 24 hour  ?Intake 293.72 ml  ?Output --  ?Net 293.72 ml  ? ?Filed Weights  ? 08/06/21 2259 08/07/21 0919  ?Weight: 107.2 kg 108.7 kg   ? ? ?Examination: ?General: Pleasant middle aged female sitting up in bed in NAD ?HEENT: [place]/AT, MM pink/moist, PERRL,  ?Neuro: Alert and oriented x3, non-focal  ?CV: s1s2 regular rate and rhythm, no murmur, rubs, or gallops,  ?PULM:  Clear to ascultation, no increased work of breathing  ?GI: soft, bowel sounds active in all 4 quadrants, non-tender, non-distended, tolerating oral diet  ?Extremities: warm/dry, no edema  ?Skin: no rashes or lesions ? ?Resolved Hospital Problem list   ? ? ?Assessment & Plan:  ? ?Drug Overdose, undetermined intent ?- States overdose was unintentional, states she grabbed the wrong medication bottle and took 10 '10mg'$  oxycodone at once  ?P: ?Psych consult  ?Narcan drip off  ?Continue to monitor in the ICU  ?Remains under IVC ? ?Cellulitis LLE ?- given ceftriaxone in ED  ?- no fevers, pain, leukocytosis. Will hold on further abx at this this time ?P: ?Continue to monitor off antibiotics  ? ?80 pack year smoking history ?P: ?Continue nicotine patch  ?PRN BDs  ? ?Type 2 DM, not insulin dependent ?P: ?Hold home Metformin  ?Continue SSI  ? ?Hx of HTN  ?P: ?Resume home HCTX and Spironolactone  ? ?Best Practice (right click and "Reselect all SmartList Selections" daily)  ? ?Diet/type: NPO initially, will advance diet while she is on narcan.  ?DVT prophylaxis: prophylactic heparin  ?GI prophylaxis: N/A ?Lines: N/A ?Foley:  N/A ?Code Status:  full code ?Last date of multidisciplinary goals of  care discussion '[]'$  ? ?Critical care time: 38 mins  ? ?Shaylynn Nulty D. Harris, NP-C ? Pulmonary & Critical Care ?Personal contact information can be found on Amion  ?08/07/2021, 10:42 AM ? ? ? ?

## 2021-08-07 NOTE — Discharge Summary (Signed)
Physician Discharge Summary  ? ?  ? ? ? ? ?Patient ID: ?Madison Wells ?MRN: [id number] ?DOB/AGE: 26-Feb-1956 66 y.o. ? ?Admit date: 08/06/2021 ?Discharge date: 08/07/2021 ? ?Discharge Diagnoses:   ?Unintentional drug overdose  ??Cellulitis LLE ?80 pack year smoking history ?Type 2 DM, not insulin dependent  ?Hx of HTN  ? ?Discharge summary   ?Madison Wells is a 66 y.o. woman with history of chronic pain who came in brought in by EMS from home after 10 15 mg oxycodone pills were taken around 9pm on 3/16. Given narcan with good effect for lethargy and somnolence. Required persistent dosing so narcan infusion was started. PCCM asked to admit the patient to the ICU in light of potential to decompensate and respiratory failure monitoring.  While on narcan drip this morning she is awake and alert and oriented. She was sorting her pills in her pill box and accidentally took the pills she meant to sort rather than the ones in her box - she was apparently doing all this with the lights off. She denies intent for self-harm and denies any desire for SI. Nurse confirms she has been consistently denying self-harm.  ? ?Patent was admitted to ICU around 0800 and Narcan drip was stopped as she was completely alert and oriented. She continues to state that the overdose was unintentional and that she had no Internet to harm herself. She states that she called poison control right after she discovered the mistake.  ? ?She progressed quickly and is stable for discharge afternoon of 3/17. She is eating appropriately and ambulating per baseline.  ? ?Discharge Plan by Active Problems   ?Unintentional drug overdose  ?- States overdose was unintentional, states she grabbed the wrong medication bottle and took 10 '15mg'$  oxycodone at once  ?P: ?IVC resended  ?Cleared by ICU attending  ?  ?Cellulitis LLE ?- given ceftriaxone in ED  ?- no fevers, pain, leukocytosis. Will hold on further abx at this this time ?P: ?Continue to monitor off  antibiotics  ?Will need to follow up with PCP ?  ?80 pack year smoking history ?P: ?Continue nicotine patch  ?Cessation education provided  ?  ?Type 2 DM, not insulin dependent ?P: ?Hold home Metformin  ?Continue SSI  ?  ?Hx of HTN  ?P: ?Continue home HCTZ and Spironolactone ? ?Significant Hospital tests/ studies  ?None ? ?Procedures   ?None  ? ?Culture data/antimicrobials   ?None  ?  ?Consults  ?None  ?  ? ?Discharge Exam: ?BP (!) 190/91   Pulse 60   Temp 97.8 ?F (36.6 ?C) (Oral)   Resp 14   Ht '5\' 5"'$  (1.651 m)   Wt 108.7 kg   LMP 08/13/2011   SpO2 99%   BMI 39.88 kg/m?  ? ?General: Pleasant middle aged female sitting up in bed in NAD ?HEENT: /AT, MM pink/moist, PERRL,  ?Neuro: Alert and oriented x3, non-focal  ?CV: s1s2 regular rate and rhythm, no murmur, rubs, or gallops,  ?PULM:  Clear to ascultation, no increased work of breathing  ?GI: soft, bowel sounds active in all 4 quadrants, non-tender, non-distended, tolerating oral diet  ?Extremities: warm/dry, no edema  ?Skin: no rashes or lesions ? ?Labs at discharge  ? ?Lab Results  ?Component Value Date  ? CREATININE 0.47 08/07/2021  ? BUN 14 08/07/2021  ? NA 139 08/07/2021  ? K 3.8 08/07/2021  ? CL 103 08/07/2021  ? CO2 28 08/07/2021  ? ?Lab Results  ?Component Value Date  ? WBC  7.7 08/07/2021  ? HGB 12.9 08/07/2021  ? HCT 38.7 08/07/2021  ? MCV 92.6 08/07/2021  ? PLT 211 08/07/2021  ? ?Lab Results  ?Component Value Date  ? ALT 16 08/06/2021  ? AST 20 08/06/2021  ? ALKPHOS 71 08/06/2021  ? BILITOT 0.1 (L) 08/06/2021  ? ?Lab Results  ?Component Value Date  ? INR 0.93 02/03/2017  ? INR 0.96 09/02/2010  ? ? ?Current radiological studies   ? ?No results found. ? ?Disposition:  ? ?  ? ? ? ?Allergies as of 08/07/2021   ?No Known Allergies ?  ? ?  ?Medication List  ?  ? ?TAKE these medications   ? ?alendronate 70 MG tablet ?Commonly known as: FOSAMAX ?TAKE 1 TABLET EVERY WEEK IN THE MORNING AT LEAST 30 MINS BEFORE FIRST FOOD/BEVERAGE/OR MEDS OF DAY. ?What  changed: See the new instructions. ?  ?aspirin EC 81 MG tablet ?Take 81 mg by mouth daily. ?  ?atorvastatin 10 MG tablet ?Commonly known as: LIPITOR ?TAKE 1 TABLET BY MOUTH EVERY DAY ?  ?buPROPion 150 MG 24 hr tablet ?Commonly known as: WELLBUTRIN XL ?TAKE 1 TABLET BY MOUTH EVERY DAY ?  ?Calcium 500 + D 500-3.125 MG-MCG Tabs ?Generic drug: Calcium Carb-Cholecalciferol ?Take 1 tablet by mouth in the morning. ?  ?cyclobenzaprine 10 MG tablet ?Commonly known as: FLEXERIL ?Take 1 tablet (10 mg total) by mouth 3 (three) times daily as needed for muscle spasms. ?  ?diclofenac Sodium 1 % Gel ?Commonly known as: VOLTAREN ?Apply 4 g topically 4 (four) times daily. ?What changed:  ?when to take this ?reasons to take this ?  ?gabapentin 300 MG capsule ?Commonly known as: NEURONTIN ?TAKE 1 CAPSULE BY MOUTH THREE TIMES A DAY ?What changed: See the new instructions. ?  ?hydrochlorothiazide 12.5 MG tablet ?Commonly known as: HYDRODIURIL ?TAKE 1 TABLET BY MOUTH EVERY DAY ?  ?ibuprofen 800 MG tablet ?Commonly known as: ADVIL ?Take 800 mg by mouth 3 (three) times daily. ?  ?ketoconazole 2 % shampoo ?Commonly known as: NIZORAL ?Apply 1 application topically 2 (two) times a week. ?  ?metFORMIN 500 MG tablet ?Commonly known as: GLUCOPHAGE ?TAKE 1 TABLET BY MOUTH 2 TIMES DAILY WITH A MEAL. ?  ?mometasone 0.1 % cream ?Commonly known as: ELOCON ?APPLY TO AFFECTED AREA A THIN LAYER DAILY ?  ?multivitamin with minerals tablet ?Take 1 tablet by mouth daily. ?  ?nicotine 21 mg/24hr patch ?Commonly known as: NICODERM CQ - dosed in mg/24 hours ?PLACE 1 PATCH ONTO THE SKIN DAILY. ?  ?oxybutynin 5 MG tablet ?Commonly known as: DITROPAN ?TAKE 1 TABLET BY MOUTH EVERY DAY ?  ?oxyCODONE HCl 15 MG Taba ?Take 10 mg by mouth 5 (five) times daily. ?  ?PARoxetine 10 MG tablet ?Commonly known as: PAXIL ?TAKE 1 TABLET BY MOUTH EVERY DAY ?  ?phentermine 15 MG capsule ?Take 15 mg by mouth daily. ?  ?potassium chloride SA 20 MEQ tablet ?Commonly known as:  Klor-Con M20 ?TAKE 2 TABLETS BY MOUTH EVERY DAY ?What changed:  ?how much to take ?how to take this ?when to take this ?additional instructions ?  ?spironolactone 25 MG tablet ?Commonly known as: ALDACTONE ?TAKE 1 TABLET BY MOUTH EVERY DAY ?  ? ?  ?  ? ?Follow-up appointment   ?PCP  ? ?Discharge Condition:   ? good ? ? ?Signed: ?Arrick Dutton D. Harris, NP-C ?[facility] ?Personal contact information can be found on Amion  ?08/07/2021, 3:52 PM ? ? ? ?

## 2021-08-07 NOTE — ED Notes (Signed)
Re-called critical care for call back to [facility] MD ?

## 2021-08-07 NOTE — ED Notes (Signed)
Madison Wells with poison controlled returned phone call to discuss results. Results discussed with rep with no further recommendations.  ?

## 2021-08-07 NOTE — Care Management Obs Status (Signed)
MEDICARE OBSERVATION STATUS NOTIFICATION ? ? ?Patient Details  ?Name: Madison Wells ?MRN: [id number] ?Date of Birth: 27-Aug-1955 ? ? ?Medicare Observation Status Notification Given:  Yes ? ? ? ?Shakeia Krus, LCSW ?08/07/2021, 3:16 PM ?

## 2021-08-07 NOTE — ED Notes (Signed)
Pt confirmed this RN will be able to discussed pt care with s/o Mr Tammi Klippel. Phone number provided by pt [phone]. S/o updated regarding admission to hospital ?

## 2021-08-07 NOTE — H&P (Signed)
? ?NAME:  Madison Wells, MRN:  [id number], DOB:  08-Oct-1955, LOS: 0 ?ADMISSION DATE:  08/06/2021, CONSULTATION DATE: 08/07/2021 ?REFERRING MD:  EDP CHIEF COMPLAINT:  drug overdose ? ?History of Present Illness:  ?Madison Wells is a 66 y.o. woman with history of chronic pain who came in brought in by EMS from home after 10 15 mg oxycodone pills were taken around 9pm on 3/16. Given narcan with good effect for lethargy and somnolence. Required persistent dosing so narcan infusion was started. PCCM asked to admit the patient to the ICU in light of potential to decompensate and respiratory failure monitoring.  While on narcan drip this morning she is awake and alert and oriented. She was sorting her pills in her pill box and accidentally took the pills she meant to sort rather than the ones in her box - she was apparently doing all this with the lights off. She denies intent for self-harm and denies any desire for SI. Nurse confirms she has been consistently denying self-harm.  ? ?Pertinent  Medical History  ?Chronic Pain ?Type 2 DM ?HLD ?Bipolar disorder ?Osteoporosis ? ? ?Significant Hospital Events: ?Including procedures, antibiotic start and stop dates in addition to other pertinent events   ?3/17 - presented to ED after being brought in by EMS.  ? ?Interim History / Subjective:  ? ? ?Objective   ?Blood pressure 131/63, pulse 69, temperature 97.7 ?F (36.5 ?C), temperature source Oral, resp. rate 12, height 5' 3.8" (1.621 m), weight 107.2 kg, last menstrual period 08/13/2011, SpO2 94 %. ?   ?   ?No intake or output data in the 24 hours ending 08/07/21 0132 ?Filed Weights  ? 08/06/21 2259  ?Weight: 107.2 kg  ? ? ?Examination: ?General: drowsy but awake, alert ?HENT: mmm ?Lungs: ctab no increased work of breathing no wheezes ?Cardiovascular: RRR no mrg ?Abdomen: soft, nontender ?Extremities: mild LE left sided redness, likely chronic changes, no swelling ?Neuro: Aox4 no focal asymmetry ? ?Resolved Hospital  Problem list   ? ? ?Assessment & Plan:  ? ?Drug Overdose, undetermined intent ?- witnessed opioid overdose responsive to narcan ?- continue narcan gtt. Per poison control needs to be monitored for 7 hours after narcan drip has been turned off.  ?- monitor in ICU for respiratory failure.  ?- she was placed in IVC on admission by ED doc. Likely this can be re-reviewed by daytime. She understands she needs to be observed for a few hours.  ? ?Cellulitis LLE ?- given ceftriaxone in ED  ?- no fevers, pain, leukocytosis. Will hold on further abx at this this time ? ?80 pack year smoking history ?- nicotine patch ?- prn bronchodilators if needed ? ?Type 2 DM, not insulin dependent ?Hold metformin ?SSI ? ?Best Practice (right click and "Reselect all SmartList Selections" daily)  ? ?Diet/type: NPO initially, will advance diet while she is on narcan.  ?DVT prophylaxis: prophylactic heparin  ?GI prophylaxis: N/A ?Lines: N/A ?Foley:  N/A ?Code Status:  full code ?Last date of multidisciplinary goals of care discussion '[]'$  ? ?Labs   ?CBC: ?Recent Labs  ?Lab 08/06/21 ?2254  ?WBC 9.3  ?NEUTROABS 5.5  ?HGB 13.9  ?HCT 41.6  ?MCV 93.1  ?PLT 205  ? ? ?Basic Metabolic Panel: ?Recent Labs  ?Lab 08/06/21 ?2254  ?NA 138  ?K 3.6  ?CL 103  ?CO2 27  ?GLUCOSE 123*  ?BUN 16  ?CREATININE 0.66  ?CALCIUM 8.8*  ? ?GFR: ?Estimated Creatinine Clearance: 83.5 mL/min (by C-G formula based on SCr  of 0.66 mg/dL). ?Recent Labs  ?Lab 08/06/21 ?2254  ?WBC 9.3  ? ? ?Liver Function Tests: ?Recent Labs  ?Lab 08/06/21 ?2254  ?AST 20  ?ALT 16  ?ALKPHOS 71  ?BILITOT 0.1*  ?PROT 6.8  ?ALBUMIN 3.6  ? ?No results for input(s): LIPASE, AMYLASE in the last 168 hours. ?No results for input(s): AMMONIA in the last 168 hours. ? ?ABG ?   ?Component Value Date/Time  ? TCO2 25 01/02/2015 0133  ?  ? ?Coagulation Profile: ?No results for input(s): INR, PROTIME in the last 168 hours. ? ?Cardiac Enzymes: ?No results for input(s): CKTOTAL, CKMB, CKMBINDEX, TROPONINI in the last  168 hours. ? ?HbA1C: ?Hgb A1c MFr Bld  ?Date/Time Value Ref Range Status  ?05/21/2021 05:14 PM 5.7 (H) 4.8 - 5.6 % Final  ?  Comment:  ?           Prediabetes: 5.7 - 6.4 ?         Diabetes: >6.4 ?         Glycemic control for adults with diabetes: <7.0 ?  ?10/07/2020 09:16 AM 6.0 (H) 4.8 - 5.6 % Final  ?  Comment:  ?           Prediabetes: 5.7 - 6.4 ?         Diabetes: >6.4 ?         Glycemic control for adults with diabetes: <7.0 ?  ? ? ?CBG: ?Recent Labs  ?Lab 08/06/21 ?2317  ?GLUCAP 104*  ? ? ?Review of Systems:   ?- fevers chils ?- SI/HI ?Otherwise 10 point ROS negative ? ?Past Medical History:  ?She,  has a past medical history of AKI (acute kidney injury) ([place]) (02/02/2017), Angina, Anxiety, Bipolar 1 disorder ([place]), Cervicalgia, Chronic pain syndrome, Diabetes mellitus without complication ([place]), Disturbance of skin sensation, Dizziness and giddiness, Enthesopathy of hip region, Headache, Hypertension, Hypokalemia (02/02/2017), Lumbago, Obesity, and Pain in joint, hand.  ? ?Surgical History:  ? ?Past Surgical History:  ?Procedure Laterality Date  ? CESAREAN SECTION    ? KNEE ARTHROPLASTY    ? TONSILLECTOMY    ?  ? ?Social History:  ? reports that she has been smoking cigarettes. She has a 80.00 pack-year smoking history. She has never used smokeless tobacco. She reports that she does not drink alcohol and does not use drugs.  ? ?Family History:  ?Her family history includes Alcohol abuse in her brother, father, mother, and sister.  ? ?Allergies ?No Known Allergies  ? ?Home Medications  ?Prior to Admission medications   ?Medication Sig Start Date End Date Taking? Authorizing Provider  ?alendronate (FOSAMAX) 70 MG tablet TAKE 1 TABLET EVERY WEEK IN THE MORNING AT LEAST 30 MINS BEFORE FIRST FOOD/BEVERAGE/OR MEDS OF DAY. ?Patient taking differently: Take 70 mg by mouth once a week. Monday 06/20/21  Yes [name], FNP  ?aspirin EC 81 MG tablet Take 81 mg by mouth daily.   Yes [provider]   ?atorvastatin (LIPITOR) 10 MG tablet TAKE 1 TABLET BY MOUTH EVERY DAY ?Patient taking differently: Take 10 mg by mouth daily. 05/20/21  Yes [name], FNP  ?buPROPion (WELLBUTRIN XL) 150 MG 24 hr tablet TAKE 1 TABLET BY MOUTH EVERY DAY ?Patient taking differently: Take 150 mg by mouth daily. 07/31/21  Yes [name], FNP  ?Calcium Carbonate-Vitamin D (CALCIUM 500 + D) 500-125 MG-UNIT TABS Take 1 tablet by mouth in the morning.   Yes [provider]  ?cyclobenzaprine (FLEXERIL) 10 MG tablet Take 1 tablet (10  mg total) by mouth 3 (three) times daily as needed for muscle spasms. 02/22/19  Yes [name], FNP  ?diclofenac Sodium (VOLTAREN) 1 % GEL Apply 4 g topically 4 (four) times daily. ?Patient taking differently: Apply 4 g topically 2 (two) times daily as needed (for foot pain). 06/11/21  Yes McDonald, Stephan Minister, DPM  ?gabapentin (NEURONTIN) 300 MG capsule TAKE 1 CAPSULE BY MOUTH THREE TIMES A DAY ?Patient taking differently: Take 300 mg by mouth 3 (three) times daily. 07/21/21  Yes [name], FNP  ?hydrochlorothiazide (HYDRODIURIL) 12.5 MG tablet TAKE 1 TABLET BY MOUTH EVERY DAY ?Patient taking differently: Take 12.5 mg by mouth daily. 07/31/21  Yes [name], FNP  ?ibuprofen (ADVIL,MOTRIN) 800 MG tablet Take 800 mg by mouth 3 (three) times daily.   Yes [provider]  ?metFORMIN (GLUCOPHAGE) 500 MG tablet TAKE 1 TABLET BY MOUTH 2 TIMES DAILY WITH A MEAL. ?Patient taking differently: Take 500 mg by mouth 2 (two) times daily with a meal. 07/31/21  Yes [name], FNP  ?Multiple Vitamins-Minerals (MULTIVITAMIN WITH MINERALS) tablet Take 1 tablet by mouth daily.   Yes [provider]  ?oxybutynin (DITROPAN) 5 MG tablet TAKE 1 TABLET BY MOUTH EVERY DAY ?Patient taking differently: Take 5 mg by mouth daily. 05/20/21  Yes [name], FNP  ?oxyCODONE HCl 15 MG TABA Take 10 mg by mouth 5 (five) times daily.   Yes [provider]  ?PARoxetine (PAXIL) 10 MG tablet TAKE 1  TABLET BY MOUTH EVERY DAY ?Patient taking differently: Take 10 mg by mouth daily. 05/20/21  Yes [name], FNP  ?phentermine 15 MG capsule Take 15 mg by mouth daily. 11/14/18  Yes [provider]  ?Vassie Moselle

## 2021-08-07 NOTE — ED Notes (Signed)
Critical Care admitting provider at bedside.  ?

## 2021-08-07 NOTE — Progress Notes (Signed)
Discharge instructions discussed with patient, IVs removed, pt taken to entrance via wheelchair at 1810.  ?

## 2021-08-10 ENCOUNTER — Telehealth: Payer: Self-pay

## 2021-08-10 NOTE — Telephone Encounter (Signed)
Transition Care Management Follow-up Telephone Call ?Date of discharge and from where: 08/06/2021 we ?How have you been since you were released from the hospital? Pt states she is fine, BP better.  ?Any questions or concerns? No ? ?Items Reviewed: ?Did the pt receive and understand the discharge instructions provided? Yes  ?Medications obtained and verified? Yes  ?Other? Yes  ?Any new allergies since your discharge? No  ?Dietary orders reviewed? Yes ?Do you have support at home? Yes  ? ?Home Care and Equipment/Supplies: ?Were home health services ordered? no ?If so, what is the name of the agency? N/a  ?Has the agency set up a time to come to the patient's home? no ?Were any new equipment or medical supplies ordered?  No ?What is the name of the medical supply agency? N/a ?Were you able to get the supplies/equipment? no ?Do you have any questions related to the use of the equipment or supplies? No ? ?Functional Questionnaire: (I = Independent and D = Dependent) ?ADLs: i ? ?Bathing/Dressing- i ? ?Meal Prep- i ? ?Eating- i ? ?Maintaining continence- i ? ?Transferring/Ambulation- i ? ?Managing Meds- i ? ?Follow up appointments reviewed: ? ?PCP Hospital f/u appt confirmed? No  Scheduled to see n/a on n/a @ n/a. ?[facility] f/u appt confirmed? No  Scheduled to see n/a on n/a @ n/a ?Are transportation arrangements needed? No  ?If their condition worsens, is the pt aware to call PCP or go to the Emergency Dept.? Yes ?Was the patient provided with contact information for the PCP's office or ED? Yes ?Was to pt encouraged to call back with questions or concerns? Yes  ? ?Pt states this encounter was a mistake. She feels fine & does not need an apt.  ? ?

## 2021-08-10 NOTE — Progress Notes (Signed)
?  Subjective:  ?Patient ID: Madison Wells, female    DOB: [birth year],  MRN: [id number] ? ?Chief Complaint  ?Patient presents with  ? Tendonitis  ?  8 weeks  for re-check Achilles tendon.  ? ? ?66 y.o. female presents with the above complaint. History confirmed with patient.  Pain is quite severe and has been worsening over the last few months.  She has had prior left ankle surgery.  She had x-rays recently completed.  No treatment thus far. ? ?Interval history: She has had quite a bit of improvement the lump seems like it is going down. ? ?Objective:  ?Physical Exam: ?warm, good capillary refill, no trophic changes or ulcerative lesions, normal DP and PT pulses, normal sensory exam, and severe pain on palpation with fullness and edema around the distal insertion of the Achilles tendon she has 5 out of 5 strength in complete continuity of the tendon with a negative Thompson test. ? ?Radiographs: ?Multiple views x-ray of the right foot: Severe hallux deformity and sclerosis and thickening of the Achilles tendon posteriorly with calcifications within the tendon ?Assessment:  ? ?1. Achilles tendinitis of right lower extremity   ?2. Gastrocnemius equinus of right lower extremity   ? ? ? ?Plan:  ?Patient was evaluated and treated and all questions answered. ? ?Discussed the etiology and treatment options for Achilles tendinitis including stretching, formal physical therapy with an eccentric exercises therapy plan, supportive shoegears such as a running shoe or sneaker, heel lifts, topical and oral medications.  We also discussed that I do not routinely perform injections in this area because of the risk of an increased damage or rupture of the tendon.  We also discussed the role of surgical treatment of this for patients who do not improve after exhausting non-surgical treatment options. ? ?So far seems to be improving I think she can transition out of the boot in the next few weeks and back to regular shoe gear  with 2 felt heel lifts which were dispensed.  I also recommended physical therapy and referral for this was sent to [facility] physical therapy at [facility].  I will see her back in about 2 months. ? ?Return in about 8 weeks (around 10/01/2021).  ? ?

## 2021-08-11 ENCOUNTER — Other Ambulatory Visit: Payer: Self-pay | Admitting: Nurse Practitioner

## 2021-08-11 DIAGNOSIS — T50901S Poisoning by unspecified drugs, medicaments and biological substances, accidental (unintentional), sequela: Secondary | ICD-10-CM

## 2021-08-11 DIAGNOSIS — F1190 Opioid use, unspecified, uncomplicated: Secondary | ICD-10-CM

## 2021-08-11 MED ORDER — NALOXONE HCL 4 MG/0.1ML NA LIQD: NASAL | 2 refills | Status: AC

## 2021-08-12 ENCOUNTER — Other Ambulatory Visit: Payer: Self-pay | Admitting: Nurse Practitioner

## 2021-08-12 DIAGNOSIS — R195 Other fecal abnormalities: Secondary | ICD-10-CM

## 2021-08-12 LAB — CULTURE, BLOOD (ROUTINE X 2)
Culture: NO GROWTH
Culture: NO GROWTH
Special Requests: ADEQUATE

## 2021-08-14 ENCOUNTER — Ambulatory Visit: Payer: Medicare (Managed Care)

## 2021-09-17 ENCOUNTER — Other Ambulatory Visit: Payer: Self-pay | Admitting: Nurse Practitioner

## 2021-09-24 ENCOUNTER — Ambulatory Visit (INDEPENDENT_AMBULATORY_CARE_PROVIDER_SITE_OTHER): Payer: Medicare (Managed Care) | Admitting: Podiatry

## 2021-09-24 DIAGNOSIS — M7661 Achilles tendinitis, right leg: Secondary | ICD-10-CM

## 2021-09-24 DIAGNOSIS — M21861 Other specified acquired deformities of right lower leg: Secondary | ICD-10-CM

## 2021-09-29 NOTE — Progress Notes (Signed)
?  Subjective:  ?Patient ID: Madison Wells, female    DOB: 14-Jan-1956,  MRN: [id number] ? ?Chief Complaint  ?Patient presents with  ? Tendonitis  ?  8 week follow up achilles right- patient stated that it is better- pt was not seen by physical therapy.   ? ? ?66 y.o. female presents with the above complaint. History confirmed with patient.  Pain is quite severe and has been worsening over the last few months.  She has had prior left ankle surgery.  She had x-rays recently completed.  No treatment thus far. ? ?Interval history: Continues to get better she was not seen by physical therapy.  She has been trying to take care of her husband who was diagnosed with multiple pneumonia and has an upcoming bone marrow transplant ? ?Objective:  ?Physical Exam: ?warm, good capillary refill, no trophic changes or ulcerative lesions, normal DP and PT pulses, normal sensory exam, and mild pain on palpation, edema and fullness has resolved around the distal insertion of the Achilles tendon she has 5 out of 5 strength in complete continuity of the tendon with a negative Thompson test. ? ?Radiographs: ?Multiple views x-ray of the right foot: Severe hallux deformity and sclerosis and thickening of the Achilles tendon posteriorly with calcifications within the tendon ?Assessment:  ? ?1. Achilles tendinitis of right lower extremity   ?2. Gastrocnemius equinus of right lower extremity   ? ? ? ? ?Plan:  ?Patient was evaluated and treated and all questions answered. ? ?Continues to improve I recommend she continue doing the home exercise plan that has been working for her so far.  I will see her back in about 2 months for final evaluation. ? ?Return in about 8 weeks (around 11/19/2021) for re-check Achilles tendon.  ? ?

## 2021-10-02 LAB — HM COLONOSCOPY

## 2021-10-16 ENCOUNTER — Encounter: Payer: Self-pay | Admitting: Nurse Practitioner

## 2021-10-18 ENCOUNTER — Other Ambulatory Visit: Payer: Self-pay | Admitting: Nurse Practitioner

## 2021-11-19 ENCOUNTER — Ambulatory Visit (INDEPENDENT_AMBULATORY_CARE_PROVIDER_SITE_OTHER): Payer: Medicare (Managed Care) | Admitting: Nurse Practitioner

## 2021-11-19 ENCOUNTER — Encounter: Payer: Self-pay | Admitting: Nurse Practitioner

## 2021-11-19 ENCOUNTER — Encounter: Payer: Self-pay | Admitting: Podiatry

## 2021-11-19 ENCOUNTER — Ambulatory Visit (INDEPENDENT_AMBULATORY_CARE_PROVIDER_SITE_OTHER): Payer: Medicare (Managed Care) | Admitting: Podiatry

## 2021-11-19 VITALS — BP 130/72 | HR 74 | Temp 97.8°F | Ht 65.0 in | Wt 235.8 lb

## 2021-11-19 DIAGNOSIS — E782 Mixed hyperlipidemia: Secondary | ICD-10-CM | POA: Diagnosis not present

## 2021-11-19 DIAGNOSIS — R7309 Other abnormal glucose: Secondary | ICD-10-CM | POA: Diagnosis not present

## 2021-11-19 DIAGNOSIS — M21861 Other specified acquired deformities of right lower leg: Secondary | ICD-10-CM

## 2021-11-19 DIAGNOSIS — I10 Essential (primary) hypertension: Secondary | ICD-10-CM | POA: Diagnosis not present

## 2021-11-19 DIAGNOSIS — Z720 Tobacco use: Secondary | ICD-10-CM

## 2021-11-19 DIAGNOSIS — M7661 Achilles tendinitis, right leg: Secondary | ICD-10-CM | POA: Diagnosis not present

## 2021-11-19 NOTE — Progress Notes (Signed)
I,Tianna Badgett,acting as a Education administrator for [employer], FNP.,have documented all relevant documentation on the behalf of [name], FNP,as directed by  [name], FNP while in the presence of [name], [place].  Subjective:     Patient ID: Madison Wells , female    DOB: [birth year] , 66 y.o.   MRN: [id number]   Chief Complaint  Patient presents with   Hypertension    HPI  Here for blood pressure f/u. She has been caring for her husband who has bone cancer.   Wt Readings from Last 3 Encounters: 11/19/21 : 235 lb 12.8 oz (107 kg) 08/07/21 : 239 lb 10.2 oz (108.7 kg) 05/21/21 : 236 lb 6.4 oz (107.2 kg)       Past Medical History:  Diagnosis Date   AKI (acute kidney injury) ([place]) 02/02/2017   Angina    Anxiety    Bipolar 1 disorder (HCC)    Cervicalgia    Chronic pain syndrome    Diabetes mellitus without complication (HCC)    Disturbance of skin sensation    Dizziness and giddiness    Enthesopathy of hip region    Headache    Hypertension    Hypokalemia 02/02/2017   Lumbago    Obesity    Pain in joint, hand      Family History  Problem Relation Age of Onset   Alcohol abuse Mother    Alcohol abuse Father    Alcohol abuse Sister    Alcohol abuse Brother      Current Outpatient Medications:    alendronate (FOSAMAX) 70 MG tablet, TAKE 1 TABLET EVERY WEEK IN THE MORNING AT LEAST 30 MINS BEFORE FIRST FOOD/BEVERAGE/OR MEDS OF DAY. (Patient taking differently: Take 70 mg by mouth once a week. Monday), Disp: 12 tablet, Rfl: 2   aspirin EC 81 MG tablet, Take 81 mg by mouth daily., Disp: , Rfl:    atorvastatin (LIPITOR) 10 MG tablet, TAKE 1 TABLET BY MOUTH EVERY DAY, Disp: 90 tablet, Rfl: 1   buPROPion (WELLBUTRIN XL) 150 MG 24 hr tablet, TAKE 1 TABLET BY MOUTH EVERY DAY, Disp: 90 tablet, Rfl: 0   diclofenac Sodium (VOLTAREN) 1 % GEL, Apply 4 g topically 4 (four) times daily. (Patient taking differently: Apply 4 g topically 2 (two) times daily as needed (for foot  pain).), Disp: 100 g, Rfl: 4   hydrochlorothiazide (HYDRODIURIL) 12.5 MG tablet, TAKE 1 TABLET BY MOUTH EVERY DAY (Patient taking differently: Take 12.5 mg by mouth daily.), Disp: 90 tablet, Rfl: 1   ibuprofen (ADVIL,MOTRIN) 800 MG tablet, Take 800 mg by mouth 3 (three) times daily., Disp: , Rfl:    ketoconazole (NIZORAL) 2 % shampoo, Apply 1 application topically 2 (two) times a week., Disp: 120 mL, Rfl: 1   metFORMIN (GLUCOPHAGE) 500 MG tablet, TAKE 1 TABLET BY MOUTH 2 TIMES DAILY WITH A MEAL. (Patient taking differently: Take 500 mg by mouth 2 (two) times daily with a meal.), Disp: 180 tablet, Rfl: 1   mometasone (ELOCON) 0.1 % cream, APPLY TO AFFECTED AREA A THIN LAYER DAILY, Disp: 60 g, Rfl: 2   Multiple Vitamins-Minerals (MULTIVITAMIN WITH MINERALS) tablet, Take 1 tablet by mouth daily., Disp: , Rfl:    naloxone (NARCAN) nasal spray 4 mg/0.1 mL, Use one spray to each nostril as needed, Disp: 1 each, Rfl: 2   oxybutynin (DITROPAN) 5 MG tablet, TAKE 1 TABLET BY MOUTH EVERY DAY, Disp: 90 tablet, Rfl: 1   oxyCODONE HCl 15 MG TABA, Take 10  mg by mouth 5 (five) times daily., Disp: , Rfl:    phentermine 15 MG capsule, Take 15 mg by mouth daily., Disp: , Rfl:    potassium chloride SA (KLOR-CON M20) 20 MEQ tablet, TAKE 2 TABLETS BY MOUTH EVERY DAY, Disp: 180 tablet, Rfl: 0   spironolactone (ALDACTONE) 25 MG tablet, TAKE 1 TABLET BY MOUTH EVERY DAY (Patient taking differently: Take 25 mg by mouth daily.), Disp: 90 tablet, Rfl: 1   Calcium Carbonate-Vitamin D (CALCIUM 500 + D) 500-125 MG-UNIT TABS, Take 1 tablet by mouth in the morning. (Patient not taking: Reported on 11/19/2021), Disp: , Rfl:    cyclobenzaprine (FLEXERIL) 10 MG tablet, Take 1 tablet (10 mg total) by mouth 3 (three) times daily as needed for muscle spasms. (Patient not taking: Reported on 11/19/2021), Disp: 30 tablet, Rfl: 1   gabapentin (NEURONTIN) 300 MG capsule, TAKE 1 CAPSULE BY MOUTH THREE TIMES A DAY, Disp: 60 capsule, Rfl: 2    nicotine (NICODERM CQ - DOSED IN MG/24 HOURS) 21 mg/24hr patch, PLACE 1 PATCH ONTO THE SKIN DAILY. (Patient not taking: Reported on 08/07/2021), Disp: 28 patch, Rfl: 1   PARoxetine (PAXIL) 10 MG tablet, TAKE 1 TABLET BY MOUTH EVERY DAY (Patient not taking: Reported on 11/19/2021), Disp: 90 tablet, Rfl: 1   No Known Allergies   Review of Systems  Constitutional: Negative.   Respiratory: Negative.    Cardiovascular:  Negative for chest pain, palpitations and leg swelling.  Gastrointestinal:  Negative for abdominal pain.  Musculoskeletal:        Right ankle pain and bulging area to posterior ankle  Neurological:  Negative for dizziness and headaches.  Psychiatric/Behavioral: Negative.       Today's Vitals   11/19/21 0920  BP: 130/72  Pulse: 74  Temp: 97.8 F (36.6 C)  Weight: 235 lb 12.8 oz (107 kg)  Height: '5\' 5"'  (1.651 m)   Body mass index is 39.24 kg/m.   Objective:  Physical Exam Vitals reviewed.  Constitutional:      General: She is not in acute distress.    Appearance: Normal appearance. She is obese.  Cardiovascular:     Rate and Rhythm: Normal rate and regular rhythm.     Pulses: Normal pulses.     Heart sounds: Normal heart sounds. No murmur heard. Pulmonary:     Effort: Pulmonary effort is normal. No respiratory distress.     Breath sounds: Normal breath sounds. No wheezing.  Musculoskeletal:        General: No swelling or tenderness.  Neurological:     General: No focal deficit present.     Mental Status: She is alert and oriented to person, place, and time.     Cranial Nerves: No cranial nerve deficit.     Motor: No weakness.  Psychiatric:        Mood and Affect: Mood normal.        Behavior: Behavior normal.        Thought Content: Thought content normal.        Judgment: Judgment normal.         Assessment And Plan:     1. Abnormal glucose Comments: Stable, continue current medications, tolerating well. - Hemoglobin A1c  2. Essential  hypertension Comments: Blood pressure is well controlled, continue current medications  - BMP8+eGFR  3. Mixed hyperlipidemia Comments: Stable, continue statin, tolerating well.  - Lipid panel  4. Tobacco abuse Comments: She is unable to tolerate Chantix and Nitro patches have been ineffective  Smoking cessation instruction/counseling given:  counseled patient on the dangers of tobacco use, advised patient to stop smoking, and reviewed strategies to maximize success   Patient was given opportunity to ask questions. Patient verbalized understanding of the plan and was able to repeat key elements of the plan. All questions were answered to their satisfaction.  [name], FNP   I, [name], FNP, have reviewed all documentation for this visit. The documentation on 11/19/21 for the exam, diagnosis, procedures, and orders are all accurate and complete.   IF YOU HAVE BEEN REFERRED TO A SPECIALIST, IT MAY TAKE 1-2 WEEKS TO SCHEDULE/PROCESS THE REFERRAL. IF YOU HAVE NOT HEARD FROM US/SPECIALIST IN TWO WEEKS, PLEASE GIVE [place] A CALL AT [phone].   THE PATIENT IS ENCOURAGED TO PRACTICE SOCIAL DISTANCING DUE TO THE COVID-19 PANDEMIC.

## 2021-11-19 NOTE — Patient Instructions (Signed)
Smoking Tobacco Information, Adult Smoking tobacco can be harmful to your health. Tobacco contains a toxic colorless chemical called nicotine. Nicotine causes changes in your brain that make you want more and more. This is called addiction. This can make it hard to stop smoking once you start. Tobacco also has other toxic chemicals that can hurt your body and raise your risk of many cancers. Menthol or "lite" tobacco or cigarette brands are not safer than regular brands. How can smoking tobacco affect me? Smoking tobacco puts you at risk for: Cancer. Smoking is most commonly associated with lung cancer, but can also lead to cancer in other parts of the body. Chronic obstructive pulmonary disease (COPD). This is a long-term lung condition that makes it hard to breathe. It also gets worse over time. High blood pressure (hypertension), heart disease, stroke, heart attack, and lung infections, such as pneumonia. Cataracts. This is when the lenses in the eyes become clouded. Digestive problems. This may include peptic ulcers, heartburn, and gastroesophageal reflux disease (GERD). Oral health problems, such as gum disease, mouth sores, and tooth loss. Loss of taste and smell. Smoking also affects how you look and smell. Smoking may cause: Wrinkles. Yellow or stained teeth, fingers, and fingernails. Bad breath. Bad-smelling clothes and hair. Smoking tobacco can also affect your social life, because: It may be challenging to find places to smoke when away from home. Many workplaces, restaurants, hotels, and public places are tobacco-free. Smoking is expensive. This is due to the cost of tobacco and the long-term costs of treating health problems from smoking. Secondhand smoke may affect those around you. Secondhand smoke can cause lung cancer, breathing problems, and heart disease. Children of smokers have a higher risk for: Sudden infant death syndrome (SIDS). Ear infections. Lung infections. What  actions can I take to prevent health problems? Quit smoking  Do not start smoking. Quit if you already smoke. Do not replace cigarette smoking with vaping devices, such as e-cigarettes. Make a plan to quit smoking and commit to it. Look for programs to help you, and ask your health care provider for recommendations and ideas. Set a date and write down all the reasons you want to quit. Let your friends and family know you are quitting so they can help and support you. Consider finding friends who also want to quit. It can be easier to quit with someone else, so that you can support each other. Talk with your health care provider about using nicotine replacement medicines to help you quit. These include gum, lozenges, patches, sprays, or pills. If you try to quit but return to smoking, stay positive. It is common to slip up when you first quit, so take it one day at a time. Be prepared for cravings. When you feel the urge to smoke, chew gum or suck on hard candy. Lifestyle Stay busy. Take care of your body. Get plenty of exercise, eat a healthy diet, and drink plenty of water. Find ways to manage your stress, such as meditation, yoga, exercise, or time spent with friends and family. Ask your health care provider about having regular tests (screenings) to check for cancer. This may include blood tests, imaging tests, and other tests. Where to find support To get support to quit smoking, consider: Asking your health care provider for more information and resources. Joining a support group for people who want to quit smoking in your local community. There are many effective programs that may help you to quit. Calling the smokefree.gov counselor   helpline at 1-800-QUIT-NOW (1-800-784-8669). Where to find more information You may find more information about quitting smoking from: Centers for Disease Control and Prevention: cdc.gov/tobacco Smokefree.gov: smokefree.gov American Lung Association:  freedomfromsmoking.org Contact a health care provider if: You have problems breathing. Your lips, nose, or fingers turn blue. You have chest pain. You are coughing up blood. You feel like you will faint. You have other health changes that cause you to worry. Summary Smoking tobacco can negatively affect your health, the health of those around you, your finances, and your social life. Do not start smoking. Quit if you already smoke. If you need help quitting, ask your health care provider. Consider joining a support group for people in your local community who want to quit smoking. There are many effective programs that may help you to quit. This information is not intended to replace advice given to you by your health care provider. Make sure you discuss any questions you have with your health care provider. Document Revised: 05/05/2021 Document Reviewed: 05/05/2021 Elsevier Patient Education  2023 [employer] Inc.  

## 2021-11-20 LAB — BMP8+EGFR
BUN/Creatinine Ratio: 16 (ref 12–28)
BUN: 12 mg/dL (ref 8–27)
CO2: 25 mmol/L (ref 20–29)
Calcium: 9.8 mg/dL (ref 8.7–10.3)
Chloride: 103 mmol/L (ref 96–106)
Creatinine, Ser: 0.77 mg/dL (ref 0.57–1.00)
Glucose: 98 mg/dL (ref 70–99)
Potassium: 4.7 mmol/L (ref 3.5–5.2)
Sodium: 141 mmol/L (ref 134–144)
eGFR: 86 mL/min/{1.73_m2} (ref >59)

## 2021-11-20 LAB — LIPID PANEL
Chol/HDL Ratio: 1.8 ratio (ref 0.0–4.4)
Cholesterol, Total: 106 mg/dL (ref 100–199)
HDL: 59 mg/dL (ref >39)
LDL Chol Calc (NIH): 36 mg/dL (ref 0–99)
Triglycerides: 39 mg/dL (ref 0–149)
VLDL Cholesterol Cal: 11 mg/dL (ref 5–40)

## 2021-11-20 LAB — HEMOGLOBIN A1C
Est. average glucose Bld gHb Est-mCnc: 114 mg/dL
Hgb A1c MFr Bld: 5.6 % (ref 4.8–5.6)

## 2021-11-22 NOTE — Progress Notes (Signed)
  Subjective:  Patient ID: Madison Wells, female    DOB: 11/12/1955,  MRN: [id number]  No chief complaint on file.   66 y.o. female presents with the above complaint. History confirmed with patient.  Overall she is doing much better says it is about 6070% better  Objective:  Physical Exam: warm, good capillary refill, no trophic changes or ulcerative lesions, normal DP and PT pulses, normal sensory exam, and mild pain on palpation, edema and fullness has resolved around the distal insertion of the Achilles tendon she has 5 out of 5 strength in complete continuity of the tendon with a negative Thompson test.  Radiographs: Multiple views x-ray of the right foot: Severe hallux deformity and sclerosis and thickening of the Achilles tendon posteriorly with calcifications within the tendon Assessment:   1. Achilles tendinitis of right lower extremity   2. Gastrocnemius equinus of right lower extremity        Plan:  Patient was evaluated and treated and all questions answered.  Continues to improve I recommend she continue doing the home exercise plan that has been working for her so far.  I will see her back in about 2 months for final evaluation.  I did give her a work note to continue to wear the boot at work as needed.  Return in about 2 months (around 01/19/2022) for re-check Achilles tendon.

## 2021-11-23 ENCOUNTER — Other Ambulatory Visit: Payer: Self-pay | Admitting: Nurse Practitioner

## 2021-12-31 ENCOUNTER — Ambulatory Visit (INDEPENDENT_AMBULATORY_CARE_PROVIDER_SITE_OTHER): Payer: Medicare (Managed Care)

## 2021-12-31 VITALS — BP 130/70 | HR 68 | Temp 97.5°F | Ht 65.2 in | Wt 230.0 lb

## 2021-12-31 DIAGNOSIS — Z0000 Encounter for general adult medical examination without abnormal findings: Secondary | ICD-10-CM

## 2021-12-31 NOTE — Progress Notes (Signed)
Subjective:   Madison Wells is a 66 y.o. female who presents for Medicare Annual (Subsequent) preventive examination.  Review of Systems     Cardiac Risk Factors include: advanced age (>36mn, >>53women);hypertension;obesity (BMI >30kg/m2)     Objective:    Today's Vitals   12/31/21 1224  BP: 130/70  Pulse: 68  Temp: (!) 97.5 F (36.4 C)  TempSrc: Oral  SpO2: 97%  Weight: 230 lb (104.3 kg)  Height: 5' 5.2" (1.656 m)   Body mass index is 38.04 kg/m.     12/31/2021   12:36 PM 08/06/2021   10:53 PM 12/03/2018    9:30 PM 12/03/2018   10:30 AM 02/03/2017    4:26 AM 11/13/2016    8:58 PM 01/22/2015    8:11 PM  Advanced Directives  Does Patient Have a Medical Advance Directive? No No No No No No No  Would patient like information on creating a medical advance directive? No - Patient declined No - Patient declined No - Patient declined  No - Patient declined No - Patient declined No - patient declined information    Current Medications (verified) Outpatient Encounter Medications as of 12/31/2021  Medication Sig   alendronate (FOSAMAX) 70 MG tablet TAKE 1 TABLET EVERY WEEK IN THE MORNING AT LEAST 30 MINS BEFORE FIRST FOOD/BEVERAGE/OR MEDS OF DAY. (Patient taking differently: Take 70 mg by mouth once a week. Monday)   aspirin EC 81 MG tablet Take 81 mg by mouth daily.   atorvastatin (LIPITOR) 10 MG tablet TAKE 1 TABLET BY MOUTH EVERY DAY   buPROPion (WELLBUTRIN XL) 150 MG 24 hr tablet TAKE 1 TABLET BY MOUTH EVERY DAY   diclofenac Sodium (VOLTAREN) 1 % GEL Apply 4 g topically 4 (four) times daily. (Patient taking differently: Apply 4 g topically 2 (two) times daily as needed (for foot pain).)   gabapentin (NEURONTIN) 300 MG capsule TAKE 1 CAPSULE BY MOUTH THREE TIMES A DAY   hydrochlorothiazide (HYDRODIURIL) 12.5 MG tablet TAKE 1 TABLET BY MOUTH EVERY DAY (Patient taking differently: Take 12.5 mg by mouth daily.)   ibuprofen (ADVIL,MOTRIN) 800 MG tablet Take 800 mg by mouth 3  (three) times daily.   ketoconazole (NIZORAL) 2 % shampoo Apply 1 application topically 2 (two) times a week.   metFORMIN (GLUCOPHAGE) 500 MG tablet TAKE 1 TABLET BY MOUTH 2 TIMES DAILY WITH A MEAL. (Patient taking differently: Take 500 mg by mouth 2 (two) times daily with a meal.)   Multiple Vitamins-Minerals (MULTIVITAMIN WITH MINERALS) tablet Take 1 tablet by mouth daily.   naloxone (NARCAN) nasal spray 4 mg/0.1 mL Use one spray to each nostril as needed   oxybutynin (DITROPAN) 5 MG tablet TAKE 1 TABLET BY MOUTH EVERY DAY   oxyCODONE HCl 15 MG TABA Take 10 mg by mouth 5 (five) times daily.   PARoxetine (PAXIL) 10 MG tablet TAKE 1 TABLET BY MOUTH EVERY DAY   phentermine 15 MG capsule Take 15 mg by mouth daily.   potassium chloride SA (KLOR-CON M20) 20 MEQ tablet TAKE 2 TABLETS BY MOUTH EVERY DAY   spironolactone (ALDACTONE) 25 MG tablet TAKE 1 TABLET BY MOUTH EVERY DAY (Patient taking differently: Take 25 mg by mouth daily.)   Calcium Carbonate-Vitamin D (CALCIUM 500 + D) 500-125 MG-UNIT TABS Take 1 tablet by mouth in the morning. (Patient not taking: Reported on 12/31/2021)   cyclobenzaprine (FLEXERIL) 10 MG tablet Take 1 tablet (10 mg total) by mouth 3 (three) times daily as needed for muscle spasms. (  Patient not taking: Reported on 11/19/2021)   mometasone (ELOCON) 0.1 % cream APPLY TO AFFECTED AREA A THIN LAYER DAILY   nicotine (NICODERM CQ - DOSED IN MG/24 HOURS) 21 mg/24hr patch PLACE 1 PATCH ONTO THE SKIN DAILY. (Patient not taking: Reported on 08/07/2021)   No facility-administered encounter medications on file as of 12/31/2021.    Allergies (verified) Patient has no known allergies.   History: Past Medical History:  Diagnosis Date   AKI (acute kidney injury) ([place]) 02/02/2017   Angina    Anxiety    Bipolar 1 disorder (HCC)    Cervicalgia    Chronic pain syndrome    Diabetes mellitus without complication (HCC)    Disturbance of skin sensation    Dizziness and giddiness     Enthesopathy of hip region    Headache    Hypertension    Hypokalemia 02/02/2017   Lumbago    Obesity    Pain in joint, hand    Past Surgical History:  Procedure Laterality Date   CESAREAN SECTION     KNEE ARTHROPLASTY     TONSILLECTOMY     Family History  Problem Relation Age of Onset   Alcohol abuse Mother    Alcohol abuse Father    Alcohol abuse Sister    Alcohol abuse Brother    Social History   Socioeconomic History   Marital status: Legally Separated    Spouse name: Not on file   Number of children: 4   Years of education: HS   Highest education level: Not on file  Occupational History   Not on file  Tobacco Use   Smoking status: Every Day    Packs/day: 1.00    Years: 40.00    Total pack years: 40.00    Types: Cigarettes   Smokeless tobacco: Never   Tobacco comments:    She is down to 6 cigarettes a day - 5/17.   Vaping Use   Vaping Use: Never used  Substance and Sexual Activity   Alcohol use: Yes    Comment: ocasionally   Drug use: Yes    Types: Oxycodone   Sexual activity: Not on file  Other Topics Concern   Not on file  Social History Narrative   Drinks 2 cups of coffee a day    Social Determinants of Health   Financial Resource Strain: Low Risk  (12/31/2021)   Overall Financial Resource Strain (CARDIA)    Difficulty of Paying Living Expenses: Not hard at all  Food Insecurity: No Food Insecurity (12/31/2021)   Hunger Vital Sign    Worried About Running Out of Food in the Last Year: Never true    Ran Out of Food in the Last Year: Never true  Transportation Needs: No Transportation Needs (12/31/2021)   PRAPARE - Hydrologist (Medical): No    Lack of Transportation (Non-Medical): No  Physical Activity: Inactive (12/31/2021)   Exercise Vital Sign    Days of Exercise per Week: 0 days    Minutes of Exercise per Session: 0 min  Stress: No Stress Concern Present (12/31/2021)   [place]    Feeling of Stress : Only a little  Social Connections: Not on file    Tobacco Counseling Ready to quit: No Counseling given: Not Answered Tobacco comments: She is down to 6 cigarettes a day - 5/17.    Clinical Intake:  Pre-visit preparation completed: Yes  Pain : No/denies  pain     Nutritional Status: BMI > 30  Obese Nutritional Risks: None Diabetes: No  How often do you need to have someone help you when you read instructions, pamphlets, or other written materials from your doctor or pharmacy?: 1 - Never What is the last grade level you completed in school?: 12th grade  Diabetic? no  Interpreter Needed?: No  Information entered by :: NAllen LPN   Activities of Daily Living    12/31/2021   12:37 PM 08/07/2021   10:07 AM  In your present state of health, do you have any difficulty performing the following activities:  Hearing? 0 0  Vision? 0 0  Difficulty concentrating or making decisions? 0 0  Walking or climbing stairs? 1 0  Dressing or bathing? 0 0  Doing errands, shopping? 0 0  Preparing Food and eating ? N   Using the Toilet? N   In the past six months, have you accidently leaked urine? Y   Comment with cough   Do you have problems with loss of bowel control? N   Managing your Medications? N   Managing your Finances? N   Housekeeping or managing your Housekeeping? N     Patient Care Team: [name], FNP as PCP - General (General Practice) [name], FNP (General Practice)  Indicate any recent Medical Services you may have received from other than [facility] providers in the past year (date may be approximate).     Assessment:   This is a routine wellness examination for Shyia.  Hearing/Vision screen Vision Screening - Comments:: Regular eye exams,   Dietary issues and exercise activities discussed: Current Exercise Habits: The patient does not participate in regular exercise at present   Goals Addressed              This Visit's Progress    Patient Stated       12/31/2021, wants to lose weight       Depression Screen    12/31/2021   12:37 PM 09/24/2019    8:41 AM 05/03/2019   12:11 PM 04/09/2019   10:53 AM 03/26/2019    9:04 AM 12/11/2018   12:10 PM 09/21/2018    9:47 AM  PHQ 2/9 Scores  PHQ - 2 Score 0 0 0 0 0 0 0  PHQ- 9 Score      3     Fall Risk    12/31/2021   12:36 PM 05/21/2021    4:45 PM 09/24/2019    8:41 AM 05/03/2019   12:11 PM 04/09/2019   10:52 AM  Ila in the past year? '1 1 1 1 1  '$ Comment trips and sometimes just fall      Number falls in past yr: 1 0 1 1 0  Injury with Fall? 0 1 0 0 1  Risk for fall due to : Medication side effect      Follow up Falls evaluation completed;Education provided;Falls prevention discussed        FALL RISK PREVENTION PERTAINING TO THE HOME:  Any stairs in or around the home? Yes  If so, are there any without handrails? No  Home free of loose throw rugs in walkways, pet beds, electrical cords, etc? Yes  Adequate lighting in your home to reduce risk of falls? Yes   ASSISTIVE DEVICES UTILIZED TO PREVENT FALLS:  Life alert?  no Use of a cane, walker or w/c? No  Grab bars in the bathroom? No  Shower chair  or bench in shower? No  Elevated toilet seat or a handicapped toilet? No   TIMED UP AND GO:  Was the test performed? No .    Gait steady and fast without use of assistive device  Cognitive Function:        12/31/2021   12:38 PM  6CIT Screen  What Year? 0 points  What month? 0 points  What time? 0 points  Count back from 20 0 points  Months in reverse 0 points  Repeat phrase 2 points  Total Score 2 points    Immunizations Immunization History  Administered Date(s) Administered   Fluad Quad(high Dose 65+) 05/21/2021   Influenza Split 04/22/2011   Influenza,inj,Quad PF,6+ Mos 02/04/2017, 03/23/2018, 03/26/2019, 03/26/2020   PFIZER(Purple Top)SARS-COV-2 Vaccination 08/15/2019, 09/03/2019,  05/05/2020   PNEUMOCOCCAL CONJUGATE-20 06/01/2021   Pneumococcal Polysaccharide-23 02/04/2017   Zoster Recombinat (Shingrix) 06/01/2021    TDAP status: Up to date  Flu Vaccine status: Due, Education has been provided regarding the importance of this vaccine. Advised may receive this vaccine at local pharmacy or Health Dept. Aware to provide a copy of the vaccination record if obtained from local pharmacy or Health Dept. Verbalized acceptance and understanding.  Pneumococcal vaccine status: Up to date  Covid-19 vaccine status: Completed vaccines  Qualifies for Shingles Vaccine? Yes   Zostavax completed No   Shingrix Completed?:needs second dose  Screening Tests Health Maintenance  Topic Date Due   COVID-19 Vaccine (4 - Pfizer risk series) 06/30/2020   URINE MICROALBUMIN  03/26/2021   Zoster Vaccines- Shingrix (2 of 2) 07/27/2021   FOOT EXAM  10/07/2021   INFLUENZA VACCINE  12/22/2021   OPHTHALMOLOGY EXAM  02/04/2022   HEMOGLOBIN A1C  05/21/2022   MAMMOGRAM  06/11/2022   PAP SMEAR-Modifier  03/27/2023   Fecal DNA (Cologuard)  05/29/2024   TETANUS/TDAP  01/16/2025   Pneumonia Vaccine 24+ Years old  Completed   DEXA SCAN  Completed   Hepatitis C Screening  Completed   HIV Screening  Completed   HPV VACCINES  Aged Out   COLONOSCOPY (Pts 45-35yr Insurance coverage will need to be confirmed)  Discontinued    Health Maintenance  Health Maintenance Due  Topic Date Due   COVID-19 Vaccine (4 - Pfizer risk series) 06/30/2020   URINE MICROALBUMIN  03/26/2021   Zoster Vaccines- Shingrix (2 of 2) 07/27/2021   FOOT EXAM  10/07/2021   INFLUENZA VACCINE  12/22/2021    Colorectal cancer screening: Type of screening: Colonoscopy. Completed 10/02/2021. Repeat every 10 years  Mammogram status: Completed 06/11/2021. Repeat every year  Bone Density status: Completed 06/11/2021.   Lung Cancer Screening: (Low Dose CT Chest recommended if Age 66-80years, 30 pack-year currently smoking OR  have quit w/in 15years.) does qualify.   Lung Cancer Screening Referral:   Additional Screening:  Hepatitis C Screening: does qualify; Completed 03/26/2019  Vision Screening: Recommended annual ophthalmology exams for early detection of glaucoma and other disorders of the eye. Is the patient up to date with their annual eye exam?  Yes  Who is the provider or what is the name of the office in which the patient attends annual eye exams? Can't remember If pt is not established with a provider, would they like to be referred to a provider to establish care? No .   Dental Screening: Recommended annual dental exams for proper oral hygiene  Community Resource Referral / Chronic Care Management: CRR required this visit?  No   CCM required this visit?  No  Plan:     I have personally reviewed and noted the following in the patient's chart:   Medical and social history Use of alcohol, tobacco or illicit drugs  Current medications and supplements including opioid prescriptions.  Functional ability and status Nutritional status Physical activity Advanced directives List of other physicians Hospitalizations, surgeries, and ER visits in previous 12 months Vitals Screenings to include cognitive, depression, and falls Referrals and appointments  In addition, I have reviewed and discussed with patient certain preventive protocols, quality metrics, and best practice recommendations. A written personalized care plan for preventive services as well as general preventive health recommendations were provided to patient.     Kellie Simmering, LPN   [id number]/61/[id number]   Nurse Notes: none

## 2021-12-31 NOTE — Patient Instructions (Signed)
Ms. Dolecki , Thank you for taking time to come for your Medicare Wellness Visit. I appreciate your ongoing commitment to your health goals. Please review the following plan we discussed and let me know if I can assist you in the future.   Screening recommendations/referrals: Colonoscopy: cologuard 05/29/2021 Mammogram: completed 06/11/2021, due 06/12/2022 Bone Density: completed 06/11/2021 Recommended yearly ophthalmology/optometry visit for glaucoma screening and checkup Recommended yearly dental visit for hygiene and checkup  Vaccinations: Influenza vaccine: due Pneumococcal vaccine: completed 06/01/2021 Tdap vaccine: completed 01/17/2015, due 01/16/2025 Shingles vaccine: discussed   Covid-19: 05/05/2020, 09/03/2019, 08/15/2019  Advanced directives: Advance directive discussed with you today.    Conditions/risks identified: none  Next appointment: Follow up in one year for your annual wellness visit    Preventive Care 65 Years and Older, Female Preventive care refers to lifestyle choices and visits with your health care provider that can promote health and wellness. What does preventive care include? A yearly physical exam. This is also called an annual well check. Dental exams once or twice a year. Routine eye exams. Ask your health care provider how often you should have your eyes checked. Personal lifestyle choices, including: Daily care of your teeth and gums. Regular physical activity. Eating a healthy diet. Avoiding tobacco and drug use. Limiting alcohol use. Practicing safe sex. Taking low-dose aspirin every day. Taking vitamin and mineral supplements as recommended by your health care provider. What happens during an annual well check? The services and screenings done by your health care provider during your annual well check will depend on your age, overall health, lifestyle risk factors, and family history of disease. Counseling  Your health care provider may ask you  questions about your: Alcohol use. Tobacco use. Drug use. Emotional well-being. Home and relationship well-being. Sexual activity. Eating habits. History of falls. Memory and ability to understand (cognition). Work and work Statistician. Reproductive health. Screening  You may have the following tests or measurements: Height, weight, and BMI. Blood pressure. Lipid and cholesterol levels. These may be checked every 5 years, or more frequently if you are over 53 years old. Skin check. Lung cancer screening. You may have this screening every year starting at age 61 if you have a 30-pack-year history of smoking and currently smoke or have quit within the past 15 years. Fecal occult blood test (FOBT) of the stool. You may have this test every year starting at age 61. Flexible sigmoidoscopy or colonoscopy. You may have a sigmoidoscopy every 5 years or a colonoscopy every 10 years starting at age 36. Hepatitis C blood test. Hepatitis B blood test. Sexually transmitted disease (STD) testing. Diabetes screening. This is done by checking your blood sugar (glucose) after you have not eaten for a while (fasting). You may have this done every 1-3 years. Bone density scan. This is done to screen for osteoporosis. You may have this done starting at age 34. Mammogram. This may be done every 1-2 years. Talk to your health care provider about how often you should have regular mammograms. Talk with your health care provider about your test results, treatment options, and if necessary, the need for more tests. Vaccines  Your health care provider may recommend certain vaccines, such as: Influenza vaccine. This is recommended every year. Tetanus, diphtheria, and acellular pertussis (Tdap, Td) vaccine. You may need a Td booster every 10 years. Zoster vaccine. You may need this after age 68. Pneumococcal 13-valent conjugate (PCV13) vaccine. One dose is recommended after age 41. Pneumococcal polysaccharide  (PPSV23)  vaccine. One dose is recommended after age 25. Talk to your health care provider about which screenings and vaccines you need and how often you need them. This information is not intended to replace advice given to you by your health care provider. Make sure you discuss any questions you have with your health care provider. Document Released: 06/06/2015 Document Revised: 01/28/2016 Document Reviewed: 03/11/2015 Elsevier Interactive Patient Education  2017 [employer] Prevention in the Home Falls can cause injuries. They can happen to people of all ages. There are many things you can do to make your home safe and to help prevent falls. What can I do on the outside of my home? Regularly fix the edges of walkways and driveways and fix any cracks. Remove anything that might make you trip as you walk through a door, such as a raised step or threshold. Trim any bushes or trees on the path to your home. Use bright outdoor lighting. Clear any walking paths of anything that might make someone trip, such as rocks or tools. Regularly check to see if handrails are loose or broken. Make sure that both sides of any steps have handrails. Any raised decks and porches should have guardrails on the edges. Have any leaves, snow, or ice cleared regularly. Use sand or salt on walking paths during winter. Clean up any spills in your garage right away. This includes oil or grease spills. What can I do in the bathroom? Use night lights. Install grab bars by the toilet and in the tub and shower. Do not use towel bars as grab bars. Use non-skid mats or decals in the tub or shower. If you need to sit down in the shower, use a plastic, non-slip stool. Keep the floor dry. Clean up any water that spills on the floor as soon as it happens. Remove soap buildup in the tub or shower regularly. Attach bath mats securely with double-sided non-slip rug tape. Do not have throw rugs and other things on the  floor that can make you trip. What can I do in the bedroom? Use night lights. Make sure that you have a light by your bed that is easy to reach. Do not use any sheets or blankets that are too big for your bed. They should not hang down onto the floor. Have a firm chair that has side arms. You can use this for support while you get dressed. Do not have throw rugs and other things on the floor that can make you trip. What can I do in the kitchen? Clean up any spills right away. Avoid walking on wet floors. Keep items that you use a lot in easy-to-reach places. If you need to reach something above you, use a strong step stool that has a grab bar. Keep electrical cords out of the way. Do not use floor polish or wax that makes floors slippery. If you must use wax, use non-skid floor wax. Do not have throw rugs and other things on the floor that can make you trip. What can I do with my stairs? Do not leave any items on the stairs. Make sure that there are handrails on both sides of the stairs and use them. Fix handrails that are broken or loose. Make sure that handrails are as long as the stairways. Check any carpeting to make sure that it is firmly attached to the stairs. Fix any carpet that is loose or worn. Avoid having throw rugs at the top or bottom of  the stairs. If you do have throw rugs, attach them to the floor with carpet tape. Make sure that you have a light switch at the top of the stairs and the bottom of the stairs. If you do not have them, ask someone to add them for you. What else can I do to help prevent falls? Wear shoes that: Do not have high heels. Have rubber bottoms. Are comfortable and fit you well. Are closed at the toe. Do not wear sandals. If you use a stepladder: Make sure that it is fully opened. Do not climb a closed stepladder. Make sure that both sides of the stepladder are locked into place. Ask someone to hold it for you, if possible. Clearly mark and make  sure that you can see: Any grab bars or handrails. First and last steps. Where the edge of each step is. Use tools that help you move around (mobility aids) if they are needed. These include: Canes. Walkers. Scooters. Crutches. Turn on the lights when you go into a dark area. Replace any light bulbs as soon as they burn out. Set up your furniture so you have a clear path. Avoid moving your furniture around. If any of your floors are uneven, fix them. If there are any pets around you, be aware of where they are. Review your medicines with your doctor. Some medicines can make you feel dizzy. This can increase your chance of falling. Ask your doctor what other things that you can do to help prevent falls. This information is not intended to replace advice given to you by your health care provider. Make sure you discuss any questions you have with your health care provider. Document Released: 03/06/2009 Document Revised: 10/16/2015 Document Reviewed: 06/14/2014 Elsevier Interactive Patient Education  2017 [employer].

## 2022-01-01 ENCOUNTER — Other Ambulatory Visit: Payer: Self-pay

## 2022-01-01 MED ORDER — HYDROCHLOROTHIAZIDE 12.5 MG PO TABS: 12.5000 mg | ORAL_TABLET | Freq: Every day | ORAL | 1 refills | Status: DC

## 2022-01-01 MED ORDER — BUPROPION HCL ER (XL) 150 MG PO TB24: 150.0000 mg | ORAL_TABLET | Freq: Every day | ORAL | 0 refills | Status: DC

## 2022-01-01 MED ORDER — METFORMIN HCL 500 MG PO TABS: 500.0000 mg | ORAL_TABLET | Freq: Two times a day (BID) | ORAL | 1 refills | Status: DC

## 2022-01-01 MED ORDER — POTASSIUM CHLORIDE CRYS ER 20 MEQ PO TBCR: EXTENDED_RELEASE_TABLET | ORAL | 0 refills | Status: DC

## 2022-01-07 ENCOUNTER — Other Ambulatory Visit: Payer: Self-pay | Admitting: Nurse Practitioner

## 2022-01-07 ENCOUNTER — Other Ambulatory Visit: Payer: Self-pay | Admitting: Podiatry

## 2022-01-07 DIAGNOSIS — R600 Localized edema: Secondary | ICD-10-CM

## 2022-01-12 ENCOUNTER — Encounter: Payer: Self-pay | Admitting: Nurse Practitioner

## 2022-01-19 ENCOUNTER — Ambulatory Visit: Payer: Medicaid Other | Admitting: Podiatry

## 2022-01-25 ENCOUNTER — Other Ambulatory Visit: Payer: Self-pay | Admitting: Nurse Practitioner

## 2022-03-19 ENCOUNTER — Other Ambulatory Visit: Payer: Self-pay | Admitting: Nurse Practitioner

## 2022-03-24 ENCOUNTER — Other Ambulatory Visit: Payer: Self-pay | Admitting: Nurse Practitioner

## 2022-05-12 ENCOUNTER — Other Ambulatory Visit: Payer: Self-pay | Admitting: Radiology

## 2022-05-12 LAB — HM MAMMOGRAPHY

## 2022-05-25 ENCOUNTER — Ambulatory Visit (INDEPENDENT_AMBULATORY_CARE_PROVIDER_SITE_OTHER): Payer: Medicare (Managed Care) | Admitting: Nurse Practitioner

## 2022-05-25 ENCOUNTER — Encounter: Payer: Self-pay | Admitting: Nurse Practitioner

## 2022-05-25 VITALS — BP 124/74 | HR 61 | Temp 98.2°F | Ht 65.0 in | Wt 203.8 lb

## 2022-05-25 DIAGNOSIS — E6609 Other obesity due to excess calories: Secondary | ICD-10-CM

## 2022-05-25 DIAGNOSIS — I700 Atherosclerosis of aorta: Secondary | ICD-10-CM

## 2022-05-25 DIAGNOSIS — I10 Essential (primary) hypertension: Secondary | ICD-10-CM

## 2022-05-25 DIAGNOSIS — R7309 Other abnormal glucose: Secondary | ICD-10-CM | POA: Diagnosis not present

## 2022-05-25 DIAGNOSIS — E782 Mixed hyperlipidemia: Secondary | ICD-10-CM | POA: Insufficient documentation

## 2022-05-25 DIAGNOSIS — F319 Bipolar disorder, unspecified: Secondary | ICD-10-CM

## 2022-05-25 DIAGNOSIS — Z79899 Other long term (current) drug therapy: Secondary | ICD-10-CM

## 2022-05-25 DIAGNOSIS — Z6833 Body mass index (BMI) 33.0-33.9, adult: Secondary | ICD-10-CM

## 2022-05-25 DIAGNOSIS — Z720 Tobacco use: Secondary | ICD-10-CM | POA: Insufficient documentation

## 2022-05-25 NOTE — Progress Notes (Signed)
I,Victoria T Hamilton,acting as a Education administrator for [name], FNP.,have documented all relevant documentation on the behalf of [name], FNP,as directed by  [name], FNP while in the presence of [name], [place].    Subjective:     Patient ID: Madison Wells , female    DOB: 10-16-1955 , 67 y.o.   MRN: [id number]   Chief Complaint  Patient presents with   Hypertension    HPI  Patient presents today for a blood pressure f/u. She would also like her potassium to be checked.   She is back at work due to her rent increasing so her pain medications have increased. She continues to go to pain management. She is working in [employer], research (medical). She is working more regularly approximately 18-20 hours a week.   She reports having to find an eye doctor.  Denies dizziness, SOB, headache.   Wt Readings from Last 3 Encounters: 05/25/22 : 203 lb 12.8 oz (92.4 kg) 12/31/21 : 230 lb (104.3 kg) 11/19/21 : 235 lb 12.8 oz (107 kg)    Hypertension This is a chronic problem. The current episode started more than 1 year ago. The problem is unchanged. The problem is controlled. Pertinent negatives include no anxiety, blurred vision, chest pain, palpitations or peripheral edema. There are no associated agents to hypertension. Risk factors for coronary artery disease include obesity, sedentary lifestyle and smoking/tobacco exposure. The current treatment provides moderate improvement. There are no compliance problems.  There is no history of angina. There is no history of chronic renal disease.     Past Medical History:  Diagnosis Date   AKI (acute kidney injury) ([place]) 02/02/2017   Angina    Anxiety    Bipolar 1 disorder ([place])    Cervicalgia    Chronic pain syndrome    Diabetes mellitus without complication (HCC)    Disturbance of skin sensation    Dizziness and giddiness    Enthesopathy of hip region    Headache    Hypertension    Hypokalemia 02/02/2017   Lumbago    Obesity    Pain in joint,  hand      Family History  Problem Relation Age of Onset   Alcohol abuse Mother    Alcohol abuse Father    Alcohol abuse Sister    Alcohol abuse Brother      Current Outpatient Medications:    alendronate (FOSAMAX) 70 MG tablet, TAKE 1 TABLET EVERY WEEK IN THE MORNING AT LEAST 30 MINS BEFORE FIRST FOOD/BEVERAGE/OR MEDS OF DAY., Disp: 12 tablet, Rfl: 2   aspirin EC 81 MG tablet, Take 81 mg by mouth daily., Disp: , Rfl:    atorvastatin (LIPITOR) 10 MG tablet, TAKE 1 TABLET BY MOUTH EVERY DAY, Disp: 90 tablet, Rfl: 1   buPROPion (WELLBUTRIN XL) 150 MG 24 hr tablet, TAKE 1 TABLET BY MOUTH EVERY DAY, Disp: 90 tablet, Rfl: 0   diclofenac Sodium (VOLTAREN) 1 % GEL, APPLY 4 G TOPICALLY 4 TIMES DAILY, Disp: 100 g, Rfl: 4   gabapentin (NEURONTIN) 300 MG capsule, TAKE 1 CAPSULE BY MOUTH THREE TIMES A DAY, Disp: 60 capsule, Rfl: 2   hydrochlorothiazide (HYDRODIURIL) 12.5 MG tablet, TAKE 1 TABLET BY MOUTH EVERY DAY, Disp: 90 tablet, Rfl: 1   ibuprofen (ADVIL,MOTRIN) 800 MG tablet, Take 800 mg by mouth 3 (three) times daily., Disp: , Rfl:    ketoconazole (NIZORAL) 2 % shampoo, Apply 1 application topically 2 (two) times a week., Disp: 120 mL, Rfl: 1   metFORMIN (  GLUCOPHAGE) 500 MG tablet, TAKE 1 TABLET BY MOUTH 2 TIMES DAILY WITH A MEAL., Disp: 180 tablet, Rfl: 1   mometasone (ELOCON) 0.1 % cream, APPLY TO AFFECTED AREA A THIN LAYER DAILY, Disp: 60 g, Rfl: 2   Multiple Vitamins-Minerals (MULTIVITAMIN WITH MINERALS) tablet, Take 1 tablet by mouth daily., Disp: , Rfl:    naloxone (NARCAN) nasal spray 4 mg/0.1 mL, Use one spray to each nostril as needed, Disp: 1 each, Rfl: 2   oxybutynin (DITROPAN) 5 MG tablet, TAKE 1 TABLET BY MOUTH EVERY DAY, Disp: 90 tablet, Rfl: 1   oxyCODONE HCl 15 MG TABA, Take 10 mg by mouth 5 (five) times daily., Disp: , Rfl:    PARoxetine (PAXIL) 10 MG tablet, TAKE 1 TABLET BY MOUTH EVERY DAY, Disp: 90 tablet, Rfl: 1   phentermine 15 MG capsule, Take 15 mg by mouth daily., Disp:  , Rfl:    potassium chloride SA (KLOR-CON M20) 20 MEQ tablet, TAKE 2 TABLETS BY MOUTH EVERY DAY, Disp: 180 tablet, Rfl: 0   spironolactone (ALDACTONE) 25 MG tablet, Take 1 tablet (25 mg total) by mouth daily., Disp: 90 tablet, Rfl: 1   Calcium Carbonate-Vitamin D (CALCIUM 500 + D) 500-125 MG-UNIT TABS, Take 1 tablet by mouth in the morning. (Patient not taking: Reported on 12/31/2021), Disp: , Rfl:    cyclobenzaprine (FLEXERIL) 10 MG tablet, Take 1 tablet (10 mg total) by mouth 3 (three) times daily as needed for muscle spasms. (Patient not taking: Reported on 11/19/2021), Disp: 30 tablet, Rfl: 1   nicotine (NICODERM CQ - DOSED IN MG/24 HOURS) 21 mg/24hr patch, PLACE 1 PATCH ONTO THE SKIN DAILY. (Patient not taking: Reported on 08/07/2021), Disp: 28 patch, Rfl: 1   No Known Allergies   Review of Systems  Constitutional: Negative.   Eyes:  Negative for blurred vision.  Respiratory: Negative.    Cardiovascular: Negative.  Negative for chest pain and palpitations.  Neurological: Negative.   Psychiatric/Behavioral: Negative.       Today's Vitals   05/25/22 0840  BP: 124/74  Pulse: 61  Temp: 98.2 F (36.8 C)  SpO2: 97%  Weight: 203 lb 12.8 oz (92.4 kg)  Height: _0  (1.651 m)   Body mass index is 33.91 kg/m.  Wt Readings from Last 3 Encounters:  05/25/22 203 lb 12.8 oz (92.4 kg)  12/31/21 230 lb (104.3 kg)  11/19/21 235 lb 12.8 oz (107 kg)    Objective:  Physical Exam Vitals reviewed.  Constitutional:      General: She is not in acute distress.    Appearance: Normal appearance. She is obese.  Cardiovascular:     Rate and Rhythm: Normal rate and regular rhythm.     Pulses: Normal pulses.     Heart sounds: Normal heart sounds. No murmur heard. Pulmonary:     Effort: Pulmonary effort is normal. No respiratory distress.     Breath sounds: Normal breath sounds. No wheezing.  Musculoskeletal:        General: No swelling or tenderness. Normal range of motion.  Skin:    General:  Skin is warm and dry.     Capillary Refill: Capillary refill takes less than 2 seconds.  Neurological:     General: No focal deficit present.     Mental Status: She is alert and oriented to person, place, and time.     Cranial Nerves: No cranial nerve deficit.     Motor: No weakness.  Psychiatric:  Mood and Affect: Mood normal.        Behavior: Behavior normal.        Thought Content: Thought content normal.        Judgment: Judgment normal.         Assessment And Plan:     1. Essential hypertension Comments: Blood pressure is well-controlled, continue current medications - CMP14+EGFR  2. Abnormal glucose Comments: Hemoglobin A1c is stable. Referral to opthalmologist. Diabetes foot exam done with decreased sensation bilaterally - CMP14+EGFR - Hemoglobin A1c - Ambulatory referral to Ophthalmology  3. Mixed hyperlipidemia Comments: Cholesterol levels are stable, continue statin, tolerating well - CMP14+EGFR - Lipid panel  4. Tobacco abuse Comments: Will check low-dose CT scan lung screening - CT CHEST LUNG CA SCREEN LOW DOSE W/O CM; Future  5. Bipolar 1 disorder (HCC) Comments: Stable  6. Atherosclerosis of aorta (HCC) Comments: Continue statin  7. Class 1 obesity due to excess calories without serious comorbidity with body mass index (BMI) of 33.0 to 33.9 in adult She is encouraged to strive for BMI less than 30 to decrease cardiac risk. Advised to aim for at least 150 minutes of exercise per week.   Congratulated on her 30 pound weight loss.   8. Other long term (current) drug therapy - CBC    Patient was given opportunity to ask questions. Patient verbalized understanding of the plan and was able to repeat key elements of the plan. All questions were answered to their satisfaction.  [name], FNP   I, [name], FNP, have reviewed all documentation for this visit. The documentation on 05/25/22 for the exam, diagnosis, procedures, and orders are all  accurate and complete.   IF YOU HAVE BEEN REFERRED TO A SPECIALIST, IT MAY TAKE 1-2 WEEKS TO SCHEDULE/PROCESS THE REFERRAL. IF YOU HAVE NOT HEARD FROM US/SPECIALIST IN TWO WEEKS, PLEASE GIVE [place] A CALL AT [phone].   THE PATIENT IS ENCOURAGED TO PRACTICE SOCIAL DISTANCING DUE TO THE COVID-19 PANDEMIC.

## 2022-05-25 NOTE — Patient Instructions (Addendum)
Hypertension, Adult Hypertension is another name for high blood pressure. High blood pressure forces your heart to work harder to pump blood. This can cause problems over time. There are two numbers in a blood pressure reading. There is a top number (systolic) over a bottom number (diastolic). It is best to have a blood pressure that is below 120/80. What are the causes? The cause of this condition is not known. Some other conditions can lead to high blood pressure. What increases the risk? Some lifestyle factors can make you more likely to develop high blood pressure: Smoking. Not getting enough exercise or physical activity. Being overweight. Having too much fat, sugar, calories, or salt (sodium) in your diet. Drinking too much alcohol. Other risk factors include: Having any of these conditions: Heart disease. Diabetes. High cholesterol. Kidney disease. Obstructive sleep apnea. Having a family history of high blood pressure and high cholesterol. Age. The risk increases with age. Stress. What are the signs or symptoms? High blood pressure may not cause symptoms. Very high blood pressure (hypertensive crisis) may cause: Headache. Fast or uneven heartbeats (palpitations). Shortness of breath. Nosebleed. Vomiting or feeling like you may vomit (nauseous). Changes in how you see. Very bad chest pain. Feeling dizzy. Seizures. How is this treated? This condition is treated by making healthy lifestyle changes, such as: Eating healthy foods. Exercising more. Drinking less alcohol. Your doctor may prescribe medicine if lifestyle changes do not help enough and if: Your top number is above 130. Your bottom number is above 80. Your personal target blood pressure may vary. Follow these instructions at home: Eating and drinking  If told, follow the DASH eating plan. To follow this plan: Fill one half of your plate at each meal with fruits and vegetables. Fill one fourth of your plate  at each meal with whole grains. Whole grains include whole-wheat pasta, brown rice, and whole-grain bread. Eat or drink low-fat dairy products, such as skim milk or low-fat yogurt. Fill one fourth of your plate at each meal with low-fat (lean) proteins. Low-fat proteins include fish, chicken without skin, eggs, beans, and tofu. Avoid fatty meat, cured and processed meat, or chicken with skin. Avoid pre-made or processed food. Limit the amount of salt in your diet to less than 1,500 mg each day. Do not drink alcohol if: Your doctor tells you not to drink. You are pregnant, may be pregnant, or are planning to become pregnant. If you drink alcohol: Limit how much you have to: 0-1 drink a day for women. 0-2 drinks a day for men. Know how much alcohol is in your drink. In the [place] one drink equals one 12 oz bottle of beer (355 mL), one 5 oz glass of wine (148 mL), or one 1 oz glass of hard liquor (44 mL). Lifestyle  Work with your doctor to stay at a healthy weight or to lose weight. Ask your doctor what the best weight is for you. Get at least 30 minutes of exercise that causes your heart to beat faster (aerobic exercise) most days of the week. This may include walking, swimming, or biking. Get at least 30 minutes of exercise that strengthens your muscles (resistance exercise) at least 3 days a week. This may include lifting weights or doing Pilates. Do not smoke or use any products that contain nicotine or tobacco. If you need help quitting, ask your doctor. Check your blood pressure at home as told by your doctor. Keep all follow-up visits. Medicines Take over-the-counter and prescription medicines  only as told by your doctor. Follow directions carefully. Do not skip doses of blood pressure medicine. The medicine does not work as well if you skip doses. Skipping doses also puts you at risk for problems. Ask your doctor about side effects or reactions to medicines that you should watch  for. Contact a doctor if: You think you are having a reaction to the medicine you are taking. You have headaches that keep coming back. You feel dizzy. You have swelling in your ankles. You have trouble with your vision. Get help right away if: You get a very bad headache. You start to feel mixed up (confused). You feel weak or numb. You feel faint. You have very bad pain in your: Chest. Belly (abdomen). You vomit more than once. You have trouble breathing. These symptoms may be an emergency. Get help right away. Call 911. Do not wait to see if the symptoms will go away. Do not drive yourself to the hospital. Summary Hypertension is another name for high blood pressure. High blood pressure forces your heart to work harder to pump blood. For most people, a normal blood pressure is less than 120/80. Making healthy choices can help lower blood pressure. If your blood pressure does not get lower with healthy choices, you may need to take medicine. This information is not intended to replace advice given to you by your health care provider. Make sure you discuss any questions you have with your health care provider. Document Revised: 02/26/2021 Document Reviewed: 02/26/2021 Elsevier Patient Education  [place] ON YOUR 30 LB WEIGHT LOSS, I AM PROUD OF YOU! KEEP UP THE GOOD WORK!

## 2022-05-26 LAB — CBC
Hematocrit: 41.6 % (ref 34.0–46.6)
Hemoglobin: 14.4 g/dL (ref 11.1–15.9)
MCH: 30.9 pg (ref 26.6–33.0)
MCHC: 34.6 g/dL (ref 31.5–35.7)
MCV: 89 fL (ref 79–97)
Platelets: 211 10*3/uL (ref 150–450)
RBC: 4.66 x10E6/uL (ref 3.77–5.28)
RDW: 12.1 % (ref 11.7–15.4)
WBC: 7 10*3/uL (ref 3.4–10.8)

## 2022-05-26 LAB — CMP14+EGFR
ALT: 14 IU/L (ref 0–32)
AST: 18 IU/L (ref 0–40)
Albumin/Globulin Ratio: 2.1 (ref 1.2–2.2)
Albumin: 4.1 g/dL (ref 3.9–4.9)
Alkaline Phosphatase: 93 IU/L (ref 44–121)
BUN/Creatinine Ratio: 12 (ref 12–28)
BUN: 8 mg/dL (ref 8–27)
Bilirubin Total: 0.5 mg/dL (ref 0.0–1.2)
CO2: 27 mmol/L (ref 20–29)
Calcium: 9.5 mg/dL (ref 8.7–10.3)
Chloride: 99 mmol/L (ref 96–106)
Creatinine, Ser: 0.67 mg/dL (ref 0.57–1.00)
Globulin, Total: 2 g/dL (ref 1.5–4.5)
Glucose: 103 mg/dL — ABNORMAL HIGH (ref 70–99)
Potassium: 4.6 mmol/L (ref 3.5–5.2)
Sodium: 142 mmol/L (ref 134–144)
Total Protein: 6.1 g/dL (ref 6.0–8.5)
eGFR: 96 mL/min/{1.73_m2} (ref >59)

## 2022-05-26 LAB — LIPID PANEL
Chol/HDL Ratio: 1.6 ratio (ref 0.0–4.4)
Cholesterol, Total: 112 mg/dL (ref 100–199)
HDL: 70 mg/dL (ref >39)
LDL Chol Calc (NIH): 29 mg/dL (ref 0–99)
Triglycerides: 54 mg/dL (ref 0–149)
VLDL Cholesterol Cal: 13 mg/dL (ref 5–40)

## 2022-05-26 LAB — HEMOGLOBIN A1C
Est. average glucose Bld gHb Est-mCnc: 117 mg/dL
Hgb A1c MFr Bld: 5.7 % — ABNORMAL HIGH (ref 4.8–5.6)

## 2022-06-22 ENCOUNTER — Ambulatory Visit
Admission: RE | Admit: 2022-06-22 | Discharge: 2022-06-22 | Disposition: A | Discharge status: Home / Self Care | Payer: Medicare (Managed Care) | Source: Ambulatory Visit | Attending: Nurse Practitioner | Admitting: Nurse Practitioner

## 2022-06-22 DIAGNOSIS — Z720 Tobacco use: Secondary | ICD-10-CM

## 2022-06-23 ENCOUNTER — Other Ambulatory Visit: Payer: Self-pay | Admitting: Nurse Practitioner

## 2022-06-30 ENCOUNTER — Encounter: Payer: Self-pay | Admitting: Nurse Practitioner

## 2022-08-31 LAB — HM DIABETES EYE EXAM

## 2022-09-20 ENCOUNTER — Other Ambulatory Visit: Payer: Self-pay | Admitting: Nurse Practitioner

## 2022-09-21 ENCOUNTER — Other Ambulatory Visit: Payer: Self-pay | Admitting: Nurse Practitioner

## 2022-09-21 DIAGNOSIS — Z720 Tobacco use: Secondary | ICD-10-CM

## 2022-09-21 DIAGNOSIS — J432 Centrilobular emphysema: Secondary | ICD-10-CM

## 2022-09-22 ENCOUNTER — Ambulatory Visit: Payer: Medicare (Managed Care) | Admitting: Nurse Practitioner

## 2022-10-04 ENCOUNTER — Encounter: Payer: Self-pay | Admitting: Nurse Practitioner

## 2022-10-11 ENCOUNTER — Other Ambulatory Visit: Payer: Self-pay | Admitting: Nurse Practitioner

## 2023-02-15 ENCOUNTER — Other Ambulatory Visit: Payer: Self-pay | Admitting: Nurse Practitioner

## 2023-02-15 ENCOUNTER — Encounter: Payer: Self-pay | Admitting: Nurse Practitioner

## 2023-02-15 ENCOUNTER — Ambulatory Visit
Admission: RE | Admit: 2023-02-15 | Discharge: 2023-02-15 | Disposition: A | Discharge status: Home / Self Care | Payer: Medicare (Managed Care) | Source: Ambulatory Visit | Attending: Nurse Practitioner | Admitting: Nurse Practitioner

## 2023-02-15 DIAGNOSIS — W19XXXA Unspecified fall, initial encounter: Secondary | ICD-10-CM

## 2023-02-19 ENCOUNTER — Emergency Department: Payer: Medicare (Managed Care)

## 2023-02-19 ENCOUNTER — Emergency Department
Admission: EM | Admit: 2023-02-19 | Discharge: 2023-02-19 | Disposition: A | Discharge status: Home / Self Care | Payer: Medicare (Managed Care) | Attending: Emergency Medicine | Admitting: Emergency Medicine

## 2023-02-19 ENCOUNTER — Other Ambulatory Visit: Payer: Self-pay

## 2023-02-19 DIAGNOSIS — W01198A Fall on same level from slipping, tripping and stumbling with subsequent striking against other object, initial encounter: Secondary | ICD-10-CM | POA: Insufficient documentation

## 2023-02-19 DIAGNOSIS — S0083XA Contusion of other part of head, initial encounter: Secondary | ICD-10-CM | POA: Insufficient documentation

## 2023-02-19 DIAGNOSIS — S0990XA Unspecified injury of head, initial encounter: Secondary | ICD-10-CM

## 2023-02-19 DIAGNOSIS — F17200 Nicotine dependence, unspecified, uncomplicated: Secondary | ICD-10-CM | POA: Insufficient documentation

## 2023-02-19 DIAGNOSIS — I10 Essential (primary) hypertension: Secondary | ICD-10-CM | POA: Insufficient documentation

## 2023-02-19 DIAGNOSIS — W19XXXA Unspecified fall, initial encounter: Secondary | ICD-10-CM

## 2023-02-19 LAB — BASIC METABOLIC PANEL
Anion gap: 10 (ref 5–15)
BUN: 15 mg/dL (ref 8–23)
CO2: 25 mmol/L (ref 22–32)
Calcium: 9.6 mg/dL (ref 8.9–10.3)
Chloride: 102 mmol/L (ref 98–111)
Creatinine, Ser: 0.74 mg/dL (ref 0.44–1.00)
GFR, Estimated: >60 mL/min (ref >60)
Glucose, Bld: 110 mg/dL — ABNORMAL HIGH (ref 70–99)
Potassium: 3.9 mmol/L (ref 3.5–5.1)
Sodium: 137 mmol/L (ref 135–145)

## 2023-02-19 LAB — CBC WITH DIFFERENTIAL/PLATELET
Abs Immature Granulocytes: 0.03 10*3/uL (ref 0.00–0.07)
Basophils Absolute: 0 10*3/uL (ref 0.0–0.1)
Basophils Relative: 1 %
Eosinophils Absolute: 0.2 10*3/uL (ref 0.0–0.5)
Eosinophils Relative: 3 %
HCT: 40.4 % (ref 36.0–46.0)
Hemoglobin: 13.3 g/dL (ref 12.0–15.0)
Immature Granulocytes: 0 %
Lymphocytes Relative: 33 %
Lymphs Abs: 2.7 10*3/uL (ref 0.7–4.0)
MCH: 31.3 pg (ref 26.0–34.0)
MCHC: 32.9 g/dL (ref 30.0–36.0)
MCV: 95.1 fL (ref 80.0–100.0)
Monocytes Absolute: 0.6 10*3/uL (ref 0.1–1.0)
Monocytes Relative: 8 %
Neutro Abs: 4.7 10*3/uL (ref 1.7–7.7)
Neutrophils Relative %: 55 %
Platelets: 223 10*3/uL (ref 150–400)
RBC: 4.25 MIL/uL (ref 3.87–5.11)
RDW: 13.3 % (ref 11.5–15.5)
WBC: 8.3 10*3/uL (ref 4.0–10.5)
nRBC: 0 % (ref 0.0–0.2)

## 2023-02-19 NOTE — ED Triage Notes (Signed)
Pt states she had a fall Sunday night - she tripped and her hit head on a door frame. Denies any loss of conscious; denies any pain. States she was seen by PCP and was told to go have a CT scan - but when she got there, she needed a prior approval. Pt states while working, a doctor came in as a customer and recommended that she come to the ED and have her head checked.

## 2023-02-19 NOTE — Discharge Instructions (Signed)
Your CT scans do not show any broken bones, bleeding inside your brain, or neck injuries.  You may apply ice to the hematoma to your forehead, 20 minutes on, 20 minutes off with a thin towel between the ice and your skin to protect your skin.  Please return for any new, worsening, or change in symptoms or other concerns.  It was a pleasure caring for you today.

## 2023-02-19 NOTE — ED Provider Notes (Signed)
[facility] Provider Note    Event Date/Time   First MD Initiated Contact with Patient 02/19/23 1246     (approximate)   History   Fall   HPI  Madison Wells is a 67 y.o. female who presents today for evaluation after a fall that occurred 2 days ago.  Patient reports that she accidentally fell forward and struck her head on the wall.  She did not pass out or lose consciousness.  She has not had any pain, nausea, vomiting, numbness, tingling, weakness, or any other complaints.  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  He advised that she come to the emergency department for a CAT scan.  She reports that she takes a daily baby aspirin.  Patient Active Problem List   Diagnosis Date Noted   Abnormal glucose 05/25/2022   Mixed hyperlipidemia 05/25/2022   Tobacco abuse 05/25/2022   Drug overdose 08/07/2021   Accidental drug overdose 08/07/2021   Lower extremity edema 12/18/2018   Tinea corporis 03/22/2018   Excessive sweating 03/22/2018   Hyponatremia 02/02/2017   Right Achilles tendinitis 11/29/2011   Bilateral knee pain 09/13/2011   Low back pain 09/13/2011   Carpal tunnel syndrome 08/13/2011   Leg swelling 04/21/2011   Essential hypertension    Anxiety    Bipolar 1 disorder [facility])           Physical Exam   Triage Vital Signs: ED Triage Vitals  Encounter Vitals Group     BP 02/19/23 1237 128/82     Systolic BP Percentile --      Diastolic BP Percentile --      Pulse Rate 02/19/23 1237 83     Resp --      Temp 02/19/23 1237 98.1 F (36.7 C)     Temp Source 02/19/23 1237 Oral     SpO2 02/19/23 1237 95 %     Weight --      Height --      Head Circumference --      Peak Flow --      Pain Score 02/19/23 1238 0     Pain Loc --      Pain Education --      Exclude from Growth Chart --     Most recent vital signs: Vitals:   02/19/23 1237  BP: 128/82   Pulse: 83  Temp: 98.1 F (36.7 C)  SpO2: 95%    Physical Exam Vitals and nursing note reviewed.  Constitutional:      General: Awake and alert. No acute distress.    Appearance: Normal appearance. The patient is normal weight.  HENT:     Head: Normocephalic.  Left-sided forehead hematoma with faint ecchymosis extending inferiorly involving bilateral periorbital areas, and left cheek.  There is no tenderness periorbitally, to the midface, or along the mandible.    Mouth: Mucous membranes are moist.  Eyes:     General: PERRL. Normal EOMs        Right eye: No discharge.        Left eye: No discharge.     Conjunctiva/sclera: Conjunctivae normal.  Cardiovascular:     Rate and Rhythm: Normal rate and regular rhythm.     Pulses: Normal pulses.  Pulmonary:     Effort: Pulmonary effort is normal. No respiratory distress.     Breath sounds: Normal breath sounds.  Abdominal:  Abdomen is soft. There is no abdominal tenderness. No rebound or guarding. No distention. Musculoskeletal:        General: No swelling. Normal range of motion.     Cervical back: Normal range of motion and neck supple. No midline cervical spine tenderness.  Full range of motion of neck.  Negative Spurling test.  Negative Lhermitte sign.  Normal strength and sensation in bilateral upper extremities. Normal grip strength bilaterally.  Normal intrinsic muscle function of the hand bilaterally.  Normal radial pulses bilaterally. Skin:    General: Skin is warm and dry.     Capillary Refill: Capillary refill takes less than 2 seconds.     Findings: No rash.  Neurological:     Mental Status: The patient is awake and alert.  Neurological: GCS 15 alert and oriented x3 Normal speech, no expressive or receptive aphasia or dysarthria Cranial nerves II through XII intact Normal visual fields 5 out of 5 strength in all 4 extremities with intact sensation throughout No extremity drift Normal finger-to-nose testing, no limb  or truncal ataxia     ED Results / Procedures / Treatments   Labs (all labs ordered are listed, but only abnormal results are displayed) Labs Reviewed  BASIC METABOLIC PANEL - Abnormal; Notable for the following components:      Result Value   Glucose, Bld 110 (*)    All other components within normal limits  CBC WITH DIFFERENTIAL/PLATELET     EKG     RADIOLOGY I independently reviewed and interpreted imaging and agree with radiologists findings.     PROCEDURES:  Critical Care performed:   Procedures   MEDICATIONS ORDERED IN ED: Medications - No data to display   IMPRESSION / MDM / ASSESSMENT AND PLAN / ED COURSE  I reviewed the triage vital signs and the nursing notes.   Differential diagnosis includes, but is not limited to, contusion, fracture, hematoma.  Patient is awake and alert, hemodynamically stable and neurologically and neurovascularly intact.  She has obvious trauma and ecchymosis to her head and face.  CT head, neck obtained due to [place] criteria, and CT face obtained given her obvious ecchymosis.  She is pain-free, texting on her cell phone, and in no acute distress.  She takes a daily baby aspirin.  Labs obtained in triage are reassuring without any acute findings.  CT scans are also reassuring, without facial fracture, intracranial hemorrhage, skull fracture, or cervical spine injury.  Patient requested a work note saying that she can go back to work on Monday which was provided.  We discussed return precautions and outpatient follow-up.  Patient understands and agrees with plan.  She was discharged in stable condition.   Patient's presentation is most consistent with acute complicated illness / injury requiring diagnostic workup.    FINAL CLINICAL IMPRESSION(S) / ED DIAGNOSES   Final diagnoses:  Fall, initial encounter  Contusion of face, initial encounter  Injury of head, initial encounter     Rx / DC Orders   ED Discharge Orders      None        Note:  This document was prepared using Dragon voice recognition software and may include unintentional dictation errors.   Jackelyn Hoehn, PA-C 02/19/23 1438    Merwyn Katos, MD 02/28/23 [phone]

## 2023-03-24 ENCOUNTER — Other Ambulatory Visit: Payer: Self-pay | Admitting: Medical Genetics

## 2023-03-24 DIAGNOSIS — Z006 Encounter for examination for normal comparison and control in clinical research program: Secondary | ICD-10-CM

## 2024-03-22 ENCOUNTER — Other Ambulatory Visit: Payer: Self-pay | Admitting: Medical Genetics

## 2024-03-22 DIAGNOSIS — Z006 Encounter for examination for normal comparison and control in clinical research program: Secondary | ICD-10-CM

## 2024-05-10 ENCOUNTER — Other Ambulatory Visit: Payer: Self-pay | Admitting: Nurse Practitioner

## 2024-05-10 DIAGNOSIS — R918 Other nonspecific abnormal finding of lung field: Secondary | ICD-10-CM

## 2024-05-23 ENCOUNTER — Other Ambulatory Visit

## 2024-05-23 DIAGNOSIS — R918 Other nonspecific abnormal finding of lung field: Secondary | ICD-10-CM

## 2024-07-03 ENCOUNTER — Ambulatory Visit: Payer: Self-pay
